# Patient Record
Sex: Female | Born: 1970 | Race: White | Hispanic: No | Marital: Married | Smoking: Never smoker
Health system: Southern US, Community
[De-identification: ages and names within clinical notes are randomized; demographics above are authoritative.]

## PROBLEM LIST (undated history)

## (undated) DIAGNOSIS — T7840XA Allergy, unspecified, initial encounter: Secondary | ICD-10-CM

## (undated) DIAGNOSIS — F429 Obsessive-compulsive disorder, unspecified: Secondary | ICD-10-CM

## (undated) DIAGNOSIS — E559 Vitamin D deficiency, unspecified: Secondary | ICD-10-CM

## (undated) DIAGNOSIS — M1990 Unspecified osteoarthritis, unspecified site: Secondary | ICD-10-CM

## (undated) DIAGNOSIS — F418 Other specified anxiety disorders: Secondary | ICD-10-CM

## (undated) HISTORY — DX: Vitamin D deficiency, unspecified: E55.9

## (undated) HISTORY — DX: Unspecified osteoarthritis, unspecified site: M19.90

## (undated) HISTORY — PX: TOE SURGERY: SHX1073

## (undated) HISTORY — PX: COLPOSCOPY: SHX161

## (undated) HISTORY — DX: Allergy, unspecified, initial encounter: T78.40XA

## (undated) HISTORY — DX: Other specified anxiety disorders: F41.8

## (undated) HISTORY — DX: Obsessive-compulsive disorder, unspecified: F42.9

---

## 2013-07-28 DIAGNOSIS — N870 Mild cervical dysplasia: Secondary | ICD-10-CM | POA: Insufficient documentation

## 2013-07-28 DIAGNOSIS — A6000 Herpesviral infection of urogenital system, unspecified: Secondary | ICD-10-CM | POA: Insufficient documentation

## 2013-07-28 DIAGNOSIS — Z803 Family history of malignant neoplasm of breast: Secondary | ICD-10-CM | POA: Insufficient documentation

## 2016-10-08 HISTORY — PX: CERVICAL BIOPSY: SHX590

## 2017-09-09 DIAGNOSIS — M19079 Primary osteoarthritis, unspecified ankle and foot: Secondary | ICD-10-CM | POA: Insufficient documentation

## 2018-01-06 ENCOUNTER — Encounter (HOSPITAL_COMMUNITY): Payer: Self-pay | Admitting: Psychiatry

## 2018-01-06 ENCOUNTER — Ambulatory Visit (INDEPENDENT_AMBULATORY_CARE_PROVIDER_SITE_OTHER): Payer: 59 | Admitting: Psychiatry

## 2018-01-06 VITALS — BP 119/80 | HR 92 | Ht 61.0 in | Wt 92.4 lb

## 2018-01-06 DIAGNOSIS — F429 Obsessive-compulsive disorder, unspecified: Secondary | ICD-10-CM | POA: Diagnosis not present

## 2018-01-06 MED ORDER — LAMOTRIGINE 25 MG PO TABS: ORAL_TABLET | ORAL | 0 refills | Status: DC

## 2018-01-06 NOTE — Progress Notes (Signed)
Psychiatric Initial Adult Assessment   Patient Identification: Anne Hayden MRN:  [id number] Date of Evaluation:  01/06/2018 Referral Source: Self-referred. Chief Complaint:   Visit Diagnosis:    ICD-10-CM   1. Obsessive-compulsive disorder, unspecified type F42.9 lamoTRIgine (LAMICTAL) 25 MG tablet    History of Present Illness: Anne Hayden is 47 year old Caucasian, unemployed, married female who came in for her initial appointment.  She moved from [place] with her mother to live close to her family.  Patient has aunt and other family member who lives in this area.  Patient moved in with her mother when her father died in 62.  She was seeing psychiatrist in [place] and prescribed Celexa, Klonopin and occasionally taking Xanax when she had panic attack.  She described long history of anxiety and nervousness and feeling overwhelmed.  She also had panic attacks however since taking the medication these attacks are less intense and less frequent.  She remember having difficulty leaving her house, going to the grocery stores, going to the carotid places because she felt very nervous, anxious, hyperventilation and shortness of breath.  She is been taking Celexa and Klonopin for past few years and she feels it is helping her anxiety symptoms but she continues to feel sometimes sadness, hopelessness, passive and fleeting suicidal thoughts but no plan.  She also describe feeling lonely and a lot of regret about her own life.  Due to anxiety she has unable to succeed the goals in her life.  All her friends are either lawyer or doctors.  She also married twice and lived overseas in the past.  Her first husband was [place] however her marriage did not last more than 2 years.  She remarried with [place] and she has been living with her husband for 7 years.  Her husband is working in [place] and she visit him at least once a month.  Patient told her husband is doing a Community education officer job and his plan is to  come back [place] after 1 year.  Patient does not want to leave her mother.  She has 2 other sister who lives in [place] and [place].  Patient admitted that she does not leave the house unless it is important.  She feels nervous and anxious.  She has obsessive thoughts which she described counting numbers, thinking about colors, thinking about different countries.  She is sleeping better with Klonopin.  There are times when she has taken extra Klonopin when she is very nervous.  She also reported sensitivity with the previous psychotropic medication.  She denies any mania or any psychosis.  She denies any self abusive behavior.  She denies any flashbacks, nightmares or any PTSD symptoms.  Associated Signs/Symptoms: Depression Symptoms:  depressed mood, feelings of worthlessness/guilt, difficulty concentrating, anxiety, (Hypo) Manic Symptoms:  Irritable Mood, Anxiety Symptoms:  Excessive Worry, Panic Symptoms, Obsessive Compulsive Symptoms:   Counting, Observation about colors, counting, checking, Social Anxiety, Psychotic Symptoms:  No psychotic symptoms. PTSD Symptoms: Negative  Past Psychiatric History: Patient had history of anxiety and depression most of her life.  She was admitted in [facility] in 2010 due to severe depression and having suicidal thoughts.  Patient denies any history of suicidal attempt.  She has seen psychiatrist on and off and prescribed Seroquel that caused weight gain, Risperdal caused nausea, Trintellix cause itching, Geodon, Neurontin and Paxil did not help.  She also tried Lamictal when she was in [place] but could not afford when she returned to [place].  Patient had history  of obsessive thoughts but denies any paranoia or psychosis.  She also recall history of one manic symptoms when she felt increased energy, extreme irritability, hyper and too much excitement.  Previous Psychotropic Medications: Yes   Substance Abuse History in the last 12 months:   No.  Consequences of Substance Abuse: Negative  Past Medical History:  Past Medical History:  Diagnosis Date  . Osteoarthritis    History reviewed. No pertinent surgical history.  Family Psychiatric History: Kateri McUncle has bipolar disorder.  Family History: No family history on file.  Social History:   Social History   Socioeconomic History  . Marital status: Married    Spouse name: Not on file  . Number of children: Not on file  . Years of education: Not on file  . Highest education level: Not on file  Occupational History  . Not on file  Social Needs  . Financial resource strain: Not on file  . Food insecurity:    Worry: Not on file    Inability: Not on file  . Transportation needs:    Medical: Not on file    Non-medical: Not on file  Tobacco Use  . Smoking status: Not on file  Substance and Sexual Activity  . Alcohol use: Not on file  . Drug use: Not on file  . Sexual activity: Not on file  Lifestyle  . Physical activity:    Days per week: Not on file    Minutes per session: Not on file  . Stress: Not on file  Relationships  . Social connections:    Talks on phone: Not on file    Gets together: Not on file    Attends religious service: Not on file    Active member of club or organization: Not on file    Attends meetings of clubs or organizations: Not on file    Relationship status: Not on file  Other Topics Concern  . Not on file  Social History Narrative  . Not on file    Additional Social History[place]  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  She started to have anxiety and nervousness when she was in college.  She married twice.  She has no children.  She lived in [place] few years with her husband however when marriage fall apart she moved to [place].  She remarried with [place] and she was living in [place] until her father deceased and she decided to move back [place].  Patient is currently unemployed.  Allergies:  No Known  Allergies  Metabolic Disorder Labs: No results found for: HGBA1C, MPG No results found for: PROLACTIN No results found for: CHOL, TRIG, HDL, CHOLHDL, VLDL, LDLCALC   Current Medications: Current Outpatient Medications  Medication Sig Dispense Refill  . norethindrone-ethinyl estradiol (BALZIVA) 0.4-35 MG-MCG tablet Take by mouth.    . valACYclovir (VALTREX) 500 MG tablet 1 po BID prn    . ALPRAZolam (XANAX) 0.25 MG tablet alprazolam 0.25 mg tablet    . citalopram (CELEXA) 40 MG tablet citalopram 40 mg tablet    . clindamycin (CLEOCIN T) 1 % lotion clindamycin 1 % lotion    . clonazePAM (KLONOPIN) 0.5 MG tablet clonazepam 0.5 mg tablet    . ketoconazole (NIZORAL) 2 % shampoo ketoconazole 2 % shampoo    . Sod Fluoride-Potassium Nitrate (PREVIDENT 5000 SENSITIVE) 1.1-5 % PSTE PreviDent 5000 Sensitive 1.1 %-5 % dental paste    . sodium fluoride (PREVIDENT) 1.1 % GEL dental gel  PreviDent 5000 Dry Mouth 1.1 % gel  BRUSH TEETH X2 MINUTES TWICE A DAY & SPIT. DONT RINSE, EAT, OR DRINK FOR 30 MINUTES    . tretinoin (RETIN-A) 0.1 % cream tretinoin 0.1 % topical cream  APPLY A PEA SIZE AMOUNT TO FULL FACE AT BEDTIME     No current facility-administered medications for this visit.     Neurologic: Headache: No Seizure: No Paresthesias:No  Musculoskeletal: Strength & Muscle Tone: within normal limits Gait & Station: normal Patient leans: N/A  Psychiatric Specialty Exam: ROS  Blood pressure 119/80, pulse 92, height 5\' 1"  (1.549 m), weight 92 lb 6.4 oz (41.9 kg).Body mass index is 17.46 kg/m.  General Appearance: Casual and Thin and lean  Eye Contact:  Fair  Speech:  Slow  Volume:  Decreased  Mood:  Anxious  Affect:  Constricted  Thought Process:  Goal Directed  Orientation:  Full (Time, Place, and Person)  Thought Content:  Obsessions and Rumination  Suicidal Thoughts:  Passive and fleeting suicidal thoughts but no plan or any intent.  She has these thoughts most of her life.   Homicidal Thoughts:  No  Memory:  Immediate;   Good Recent;   Good Remote;   Good  Judgement:  Good  Insight:  Good  Psychomotor Activity:  Decreased  Concentration:  Concentration: Fair and Attention Span: Fair  Recall:  Good  Fund of Knowledge:Good  Language: Good  Akathisia:  No  Handed:  Right  AIMS (if indicated):  0  Assets:  Communication Skills Desire for Improvement Housing Resilience Social Support  ADL's:  Intact  Cognition: WNL  Sleep: Good    Treatment Plan Summary: Anne Hayden is 47 year old Caucasian, unemployed married female who came for her initial appointment.  She present with a history of anxiety and OCD symptoms.  She also have depression.  She is taking Celexa Klonopin and Xanax but she still have residual symptoms.  I recommended to try Lamictal which she took when she was in [place] but she could not afford when she moved back to [place].  I explained the Lamictal may help her mood symptoms and chronic suicidal thinking.  I also encouraged to see a therapist for CBT.  We discussed medication side effects in detail.  Explained that Lamictal can cause rash and in that case she need to stop the medication immediately.  Patient has a refill remaining on her Klonopin and Celexa.  She is taking Klonopin 0.5 mg twice a day and third as needed.  Celexa 40 mg daily.  Recommended to discontinue Xanax since patient is already taking Klonopin.  We discussed safety concerns at any time having active suicidal thoughts or homicidal thought and she need to call 911 or go to local emergency room.  Patient is actively looking for a primary care physician.  I recommended to call us if she have difficulty finding a PCP.  I will see her again in 4 weeks.   Cleotis Nipper, MD 6/27/201911:08 AM

## 2018-01-17 ENCOUNTER — Ambulatory Visit (HOSPITAL_COMMUNITY): Payer: 59 | Admitting: Licensed Clinical Social Worker

## 2018-01-26 ENCOUNTER — Encounter: Payer: Self-pay | Admitting: Family Medicine

## 2018-01-26 ENCOUNTER — Ambulatory Visit (INDEPENDENT_AMBULATORY_CARE_PROVIDER_SITE_OTHER): Payer: 59 | Admitting: Family Medicine

## 2018-01-26 VITALS — BP 103/67 | HR 85 | Temp 97.6°F | Resp 18 | Ht 61.0 in | Wt 92.4 lb

## 2018-01-26 DIAGNOSIS — R87618 Other abnormal cytological findings on specimens from cervix uteri: Secondary | ICD-10-CM

## 2018-01-26 DIAGNOSIS — N870 Mild cervical dysplasia: Secondary | ICD-10-CM

## 2018-01-26 DIAGNOSIS — Z7689 Persons encountering health services in other specified circumstances: Secondary | ICD-10-CM | POA: Diagnosis not present

## 2018-01-26 DIAGNOSIS — R8789 Other abnormal findings in specimens from female genital organs: Secondary | ICD-10-CM

## 2018-01-26 DIAGNOSIS — Z130 Encounter for screening for diseases of the blood and blood-forming organs and certain disorders involving the immune mechanism: Secondary | ICD-10-CM | POA: Diagnosis not present

## 2018-01-26 DIAGNOSIS — E559 Vitamin D deficiency, unspecified: Secondary | ICD-10-CM

## 2018-01-26 DIAGNOSIS — F418 Other specified anxiety disorders: Secondary | ICD-10-CM

## 2018-01-26 DIAGNOSIS — Z79899 Other long term (current) drug therapy: Secondary | ICD-10-CM

## 2018-01-26 DIAGNOSIS — F429 Obsessive-compulsive disorder, unspecified: Secondary | ICD-10-CM

## 2018-01-26 DIAGNOSIS — A6000 Herpesviral infection of urogenital system, unspecified: Secondary | ICD-10-CM

## 2018-01-26 LAB — COMPREHENSIVE METABOLIC PANEL
ALBUMIN: 3.9 g/dL (ref 3.5–5.2)
ALT: 10 U/L (ref 0–35)
AST: 14 U/L (ref 0–37)
Alkaline Phosphatase: 54 U/L (ref 39–117)
BUN: 7 mg/dL (ref 6–23)
CHLORIDE: 104 meq/L (ref 96–112)
CO2: 30 mEq/L (ref 19–32)
Calcium: 8.9 mg/dL (ref 8.4–10.5)
Creatinine, Ser: 0.74 mg/dL (ref 0.40–1.20)
GFR: 89.9 mL/min (ref >60.00)
GLUCOSE: 71 mg/dL (ref 70–99)
Potassium: 4 mEq/L (ref 3.5–5.1)
SODIUM: 139 meq/L (ref 135–145)
Total Bilirubin: 0.3 mg/dL (ref 0.2–1.2)
Total Protein: 6.5 g/dL (ref 6.0–8.3)

## 2018-01-26 LAB — CBC
HEMATOCRIT: 40 % (ref 36.0–46.0)
Hemoglobin: 13.4 g/dL (ref 12.0–15.0)
MCHC: 33.4 g/dL (ref 30.0–36.0)
MCV: 89.7 fl (ref 78.0–100.0)
Platelets: 345 10*3/uL (ref 150.0–400.0)
RBC: 4.45 Mil/uL (ref 3.87–5.11)
RDW: 13.3 % (ref 11.5–15.5)
WBC: 4.5 10*3/uL (ref 4.0–10.5)

## 2018-01-26 NOTE — Progress Notes (Signed)
Patient ID: Anne Hayden, female  DOB: 1971/02/14, 47 y.o.   MRN: [id number] Patient Care Team    Relationship Specialty Notifications Start End  Ma Hillock, DO PCP - General Family Medicine  01/26/18   Kathlee Nations, MD Consulting Physician Psychiatry  01/27/18     Chief Complaint  Patient presents with  . Establish Care    Subjective:  Anne Hayden is a 47 y.o.  female present for new patient establishment. All past medical history, surgical history, allergies, family history, immunizations, medications and social history were updated in the electronic medical record today. All recent labs, ED visits and hospitalizations within the last year were reviewed. Last CPE completed at prior PCP around 05/2017. Records requested.  Genital herpes simplex, unspecified site Uses valtrex PRN  Depression with anxiety/Obsessive-compulsive disorder, unspecified type Established with psychiatry.   Vitamin D deficiency On daily supplement, awaiting records. 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 Colposcopy thousand and 4, 2005, 2012, and 2018 Last PAP: Pap with HR HPV (09/21/2017 10:00 AM EDT) Pap with HR HPV (09/21/2017 10:00 AM EDT)  Specimen  TPIHPVHR (Lab use only)   Pap with HR HPV (09/21/2017 10:00 AM EDT)  Narrative Performed At        [facility]  [address], [place] [address]  WFUXN:[phone]  [phone]    CYTOLOGY GYNECOLOGICAL REPORT    Patient Name: Anne Hayden, KALP. Accession #: [id number] Med. Rec. #: [id number]  Client: [facility] Collected: 09/21/2017 DOB: Sep 09, 1970 (Age:  54) Location: L1 Received: 09/22/2017 Gender:F Billing #: [id number] Reported:  09/23/2017 Pathologist:SSN: [id number] Physician(s):SUSAN LORENCZ, NP  KRISTEN B CHALK   ThinPrep, Imaging HPV-HR, Cervical /  Endocervical      Final Cytologic Diagnosis  FINAL CYTOLOGIC INTERPRETATION   Negative for intraepithelial lesion or malignancy.  ADDITIONAL DIAGNOSTIC FINDINGS   Moderate inflammation present.  STATEMENT OF ADEQUACY   Satisfactory for evaluation. Endocervical/transformation zone component  present.       These results have been sent to the patient by mail.     **Electronically Signed ByRenda Rolls, CT (ASCP)   The Pap smear is a screening test which aids in the detection of premalignant  and malignant diseases of the uterine cervix.It is not a diagnostic procedure  and should not be used as the sole method of cervical cancer detection.Both  false positive and false negative results can occur.            Previous Pap History  Spec # Date Taken Interpretation   [id number]/2016 ASCUS  [id number]/07/2015 Negative for Intraep  [id number]/16/2016 Negative for Intraep      Procedures/Addenda    Interpretation  HPV, High Risk:POSITIVE        HPV High Risk testing performed using the Hologic APTIMA HPV mRNA Assay.The  High Risk HPV types include the following: 16, 18, 31, 33, 35, 39, 45, 51, 52,  56, 58, 59, 66, 68.Note:Other HPV types that may cause anogenital lesions  may be present but not detected by this assay.The significance of the other  types of HPV in malignant processes has not been established.             Technical processing and screening performed at [facility], [place],  [place] [address].CLIA # [id number].Medical Director, Shaune Spittle, MD  [place]  [facility] [address] [place], [place] [address] Phone: [phone] Fax: [phone]  SURGICAL PATHOLOGY REPORT  Patient Name: Anne Hayden, Anne Hayden. Accession #: [id number] Med. Rec. #: [id number] Client: [facility]. Collected 10/07/2016 DOB: 12/26/1970 (Age: 5) Loc.: L1 Received: 10/07/2016 Gender:F Billing #: [id number] Reported: 10/08/2016 Pathologist: Sarajane Marek A. Tomasita Morrow, M.D. SSN: [id number] Physician(s):  Halina Andreas, NP   Clinical History ASCUS with positive high-risk HPV cervical  Specimen(s) Received A: Biopsy Cervical B: Endocervix Curettage   Final Pathologic Diagnosis A. CERVIX, BIOPSY:  1.BENIGN ENDOCERVICAL EPITHELIUM WITH NO EVIDENCE OF DYSPLASIA OR MALIGNANCY. 2.SEE COMMENT A.  B. ENDOCERVIX, CURETTAGE:  1.CIN-I (MILD SQUAMOUS DYSPLASIA AND KOILOCYTOTIC CHANGES, LOW GRADE SQUAMOUS INTRAEPITHELIAL LESION). 2.NO EVIDENCE OF MALIGNANCY. 3.MODERATE CHRONIC CERVICITIS. 4.TRANSFORMATION ZONE PRESENT. 5.SEE COMMENT B.  Depression screen PHQ 2/9 01/26/2018  Decreased Interest 0  Down, Depressed, Hopeless 2  PHQ - 2 Score 2  Altered sleeping 1  Tired, decreased energy 1  Change in appetite 0  Feeling bad or failure about yourself  2  Trouble concentrating 1  Moving slowly or fidgety/restless 0  Suicidal thoughts 0  PHQ-9 Score 7   No flowsheet data found.  Current Exercise Habits: Home exercise routine, Type of exercise: walking, Time (Minutes): 30, Frequency (Times/Week): 6, Weekly Exercise (Minutes/Week): 180, Intensity: Mild   Fall Risk  01/26/2018  Falls in the past year? No   Immunization History  Administered Date(s) Administered  . Influenza,inj,quad, With Preservative 05/13/2017    No exam data present  Past Medical History:  Diagnosis Date  . Allergy   . Depression with anxiety    est. with psychiatry, Dr. Adele Schilder. In the past she reports she has been told she was bipolar and OCD  . OCD (obsessive compulsive disorder)   . Osteoarthritis   . Vitamin D deficiency    No Known Allergies Past Surgical History:  Procedure  Laterality Date  . 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 COLPOSCOPY  2004,2005,2012,2018   Family History  Problem Relation Age of Onset  . Arthritis Mother   . Hypertension Mother   . Heart disease Father   . Stroke Father   . Polycythemia Father   . Breast cancer Sister 42  . Breast cancer Maternal Grandmother 24  . Melanoma Maternal Grandfather   . COPD Maternal Grandfather   . Diabetes Paternal Grandmother   . Heart disease Paternal Grandfather    Social History   Socioeconomic History  . Marital status: Married    Spouse name: Not on file  . Number of children: Not on file  . Years of education: Not on file  . Highest education level: Master's degree (e.g., MA, MS, MEng, MEd, MSW, MBA)  Occupational History  . Not on file  Social Needs  . Financial resource strain: Not hard at all  . Food insecurity:    Worry: Never true    Inability: Never true  . Transportation needs:    Medical: No    Non-medical: No  Tobacco Use  . Smoking status: Never Smoker  . Smokeless tobacco: Never Used  Substance and Sexual Activity  . Alcohol use: Yes    Frequency: Never    Comment: rare  . Drug use: Never  . Sexual activity: Yes    Partners: Male  Lifestyle  . Physical activity:    Days per week: 7 days    Minutes per session: 10 min  .  Stress: Very much  Relationships  . Social connections:    Talks on phone: More than three times a week    Gets together: Once a week    Attends religious service: Never    Active member of club or organization: No    Attends meetings of clubs or organizations: Never    Relationship status: Married  . Intimate partner violence:    Fear of current or ex partner: No    Emotionally abused: No    Physically abused: No    Forced sexual activity: No  Other Topics Concern  . Not on file  Social History Narrative   Married. Husband lives in [place] through the week. She recently moved to [place] to be closer to her family.    Masters of  Arts degree. Unemployed.    Allergies as of 01/26/2018   No Known Allergies     Medication List        Accurate as of 01/26/18 11:59 PM. Always use your most recent med list.          BALZIVA 0.4-35 MG-MCG tablet Generic drug:  norethindrone-ethinyl estradiol Take by mouth.   cetirizine 10 MG tablet Commonly known as:  ZYRTEC Take 10 mg by mouth daily.   citalopram 40 MG tablet Commonly known as:  CELEXA citalopram 40 mg tablet   clindamycin 1 % lotion Commonly known as:  CLEOCIN T clindamycin 1 % lotion   clonazePAM 0.5 MG tablet Commonly known as:  KLONOPIN clonazepam 0.5 mg tablet   ketoconazole 2 % shampoo Commonly known as:  NIZORAL ketoconazole 2 % shampoo   lamoTRIgine 25 MG tablet Commonly known as:  LAMICTAL Take one tab daily for one week and than 2 tab daily   PREVIDENT 1.1 % Gel dental gel Generic drug:  sodium fluoride PreviDent 5000 Dry Mouth 1.1 % gel  BRUSH TEETH X2 MINUTES TWICE A DAY & SPIT. DONT RINSE, EAT, OR DRINK FOR 30 MINUTES   PREVIDENT 5000 SENSITIVE 1.1-5 % Pste Generic drug:  Sod Fluoride-Potassium Nitrate PreviDent 5000 Sensitive 1.1 %-5 % dental paste   tretinoin 0.1 % cream Commonly known as:  RETIN-A tretinoin 0.1 % topical cream  APPLY A PEA SIZE AMOUNT TO FULL FACE AT BEDTIME   valACYclovir 500 MG tablet Commonly known as:  VALTREX 1 po BID prn       All past medical history, surgical history, allergies, family history, immunizations andmedications were updated in the EMR today and reviewed under the history and medication portions of their EMR.    Recent Results (from the past 2160 hour(s))  Comp Met (CMET)     Status: None   Collection Time: 01/26/18 11:40 AM  Result Value Ref Range   Sodium 139 135 - 145 mEq/L   Potassium 4.0 3.5 - 5.1 mEq/L   Chloride 104 96 - 112 mEq/L   CO2 30 19 - 32 mEq/L   Glucose, Bld 71 70 - 99 mg/dL   BUN 7 6 - 23 mg/dL   Creatinine, Ser 0.74 0.40 - 1.20 mg/dL   Total Bilirubin 0.3  0.2 - 1.2 mg/dL   Alkaline Phosphatase 54 39 - 117 U/L   AST 14 0 - 37 U/L   ALT 10 0 - 35 U/L   Total Protein 6.5 6.0 - 8.3 g/dL   Albumin 3.9 3.5 - 5.2 g/dL   Calcium 8.9 8.4 - 10.5 mg/dL   GFR 89.90 >60.00 mL/min  CBC     Status: None  Collection Time: 01/26/18 11:40 AM  Result Value Ref Range   WBC 4.5 4.0 - 10.5 K/uL   RBC 4.45 3.87 - 5.11 Mil/uL   Platelets 345.0 150.0 - 400.0 K/uL   Hemoglobin 13.4 12.0 - 15.0 g/dL   HCT 40.0 36.0 - 46.0 %   MCV 89.7 78.0 - 100.0 fl   MCHC 33.4 30.0 - 36.0 g/dL   RDW 13.3 11.5 - 15.5 %    Patient was never admitted.   ROS: 14 pt review of systems performed and negative (unless mentioned in an HPI)  Objective: BP 103/67 (BP Location: Right Arm, Patient Position: Sitting, Cuff Size: Small)   Pulse 85   Temp 97.6 F (36.4 C)   Resp 18   Ht _0  (1.549 m)   Wt 92 lb 6.4 oz (41.9 kg)   LMP 01/24/2018   SpO2 98%   BMI 17.46 kg/m  Gen: Afebrile. No acute distress. Nontoxic in appearance, well-developed, well-nourished,  Pleasant, thin, caucasian female.  HENT: AT. [place].  MMM  Eyes:Pupils Equal Round Reactive to light, Extraocular movements intact,  Conjunctiva without redness, discharge or icterus. Neck/lymp/endocrine: Supple,no lymphadenopathy, no thyromegaly CV: RRR no murmur, no edema, +2/4 P posterior tibialis pulses.  Chest: CTAB, no wheeze, rhonchi or crackles.  Abd: Soft. NTND. BS present. Skin: no rashes, purpura or petechiae. Warm and well-perfused. Skin intact. Neuro/Msk:  Normal gait. PERLA. EOMi. Alert. Oriented x3.   Psych: Normal affect, dress and demeanor. Normal speech. Normal thought content and judgment.  Assessment/plan: Anne Hayden is a 47 y.o. female present for establish care.  Genital herpes simplex, unspecified site On valtrex PRN, does not need refills at this time.   Depression with anxiety/Obsessive-compulsive disorder, unspecified type Established with psychiatry, Dr. Adele Schilder, which supplies all  her psychiatric medications.   Vitamin D deficiency Reports over the counter supplement   CIN I (cervical intraepithelial neoplasia I)/HPV HR-positive History of CIN I 10/07/2016. PAP 09/2017--> per GYN recs f/u 6 mos from last pap--> Due Sept 29, 2019.  - Gyn referral placed today for her to establish locally.   Return in about 5 months (around 06/28/2018) for CPE.   Note is dictated utilizing voice recognition software. Although note has been proof read prior to signing, occasional typographical errors still can be missed. If any questions arise, please do not hesitate to call for verification.  Electronically signed by: Howard Pouch, DO [place]

## 2018-01-26 NOTE — Patient Instructions (Signed)
It was a pleasure meeting you today.  I will place a referral to Gynecology, they will call you to schedule.  We will call you with labs once results available.   Please help us help you:  We are honored [facility] your [facility]. Below you will find basic instructions that you may need to access in the future. Please help us help you by reading the instructions, which cover many of the frequent questions we experience.   Prescription refills and request:  -In order to allow more efficient response time, please call your pharmacy for all refills. They will forward the request electronically to us. This allows for the quickest possible response. Request left on a nurse line can take longer to refill, since these are checked as time allows between office patients and other phone calls.  - refill request can take up to 3-5 working days to complete.  - If request is sent electronically and request is appropiate, it is usually completed in 1-2 business days.  - all patients will need to be seen routinely for all chronic medical conditions requiring prescription medications (see follow-up below). If you are overdue for follow up on your condition, you will be asked to make an appointment and we will call in enough medication to cover you until your appointment (up to 30 days).  - all controlled substances will require a face to face visit to request/refill.  - if you desire your prescriptions to go through a new pharmacy, and have an active script at original pharmacy, you will need to call your pharmacy and have scripts transferred to new pharmacy. This is completed between the pharmacy locations and not by your provider.    Results: If any images or labs were ordered, it can take up to 1 week to get results depending on the test ordered and the lab/facility running and resulting the test. - Normal or stable results, which do not need further discussion, may be released  to your mychart immediately with attached note to you. A call may not be generated for normal results. Please make certain to sign up for mychart. If you have questions on how to activate your mychart you can call the front office.  - If your results need further discussion, our office will attempt to contact you via phone, and if unable to reach you after 2 attempts, we will release your abnormal result to your mychart with instructions.  - All results will be automatically released in mychart after 1 week.  - Your provider will provide you with explanation and instruction on all relevant material in your results. Please keep in mind, results and labs may appear confusing or abnormal to the untrained eye, but it does not mean they are actually abnormal for you personally. If you have any questions about your results that are not covered, or you desire more detailed explanation than what was provided, you should make an appointment with your provider to do so.   Our office handles many outgoing and incoming calls daily. If we have not contacted you within 1 week about your results, please check your mychart to see if there is a message first and if not, then contact our office.  In helping with this matter, you help decrease call volume, and therefore allow us to be able to respond to patients needs more efficiently.   Acute office visits (sick visit):  An acute visit is intended for a new  problem and are scheduled in shorter time slots to allow schedule openings for patients with new problems. This is the appropriate visit to discuss a new problem. Problems will not be addressed by phone call or Echart message. Appointment is needed if requesting treatment. In order to provide you with excellent quality medical care with proper time for you to explain your problem, have an exam and receive treatment with instructions, these appointments should be limited to one new problem per visit. If you experience a new  problem, in which you desire to be addressed, please make an acute office visit, we save openings on the schedule to accommodate you. Please do not save your new problem for any other type of visit, let us take care of it properly and quickly for you.   Follow up visits:  Depending on your condition(s) your provider will need to see you routinely in order to provide you with quality care and prescribe medication(s). Most chronic conditions (Example: hypertension, Diabetes, depression/anxiety... etc), require visits a couple times a year. Your provider will instruct you on proper follow up for your personal medical conditions and history. Please make certain to make follow up appointments for your condition as instructed. Failing to do so could result in lapse in your medication treatment/refills. If you request a refill, and are overdue to be seen on a condition, we will always provide you with a 30 day script (once) to allow you time to schedule.    Medicare wellness (well visit): - we have a wonderful Nurse Maudie Mercury), that will meet with you and provide you will yearly medicare wellness visits. These visits should occur yearly (can not be scheduled less than 1 calendar year apart) and cover preventive health, immunizations, advance directives and screenings you are entitled to yearly through your medicare benefits. Do not miss out on your entitled benefits, this is when medicare will pay for these benefits to be ordered for you.  These are strongly encouraged by your provider and is the appropriate type of visit to make certain you are up to date with all preventive health benefits. If you have not had your medicare wellness exam in the last 12 months, please make certain to schedule one by calling the office and schedule your medicare wellness with Maudie Mercury as soon as possible.   Yearly physical (well visit):  - Adults are recommended to be seen yearly for physicals. Check with your insurance and date of your  last physical, most insurances require one calendar year between physicals. Physicals include all preventive health topics, screenings, medical exam and labs that are appropriate for gender/age and history. You may have fasting labs needed at this visit. This is a well visit (not a sick visit), new problems should not be covered during this visit (see acute visit).  - Pediatric patients are seen more frequently when they are younger. Your provider will advise you on well child visit timing that is appropriate for your their age. - This is not a medicare wellness visit. Medicare wellness exams do not have an exam portion to the visit. Some medicare companies allow for a physical, some do not allow a yearly physical. If your medicare allows a yearly physical you can schedule the medicare wellness with our nurse Maudie Mercury and have your physical with your provider after, on the same day. Please check with insurance for your full benefits.   Late Policy/No Shows:  - all new patients should arrive 15-30 minutes earlier than appointment to allow [place]  time  to  obtain all personal demographics,  insurance information and for you to complete office paperwork. - All established patients should arrive 10-15 minutes earlier than appointment time to update all information and be checked in .  - In our best efforts to run on time, if you are late for your appointment you will be asked to either reschedule or if able, we will work you back into the schedule. There will be a wait time to work you back in the schedule,  depending on availability.  - If you are unable to make it to your appointment as scheduled, please call 24 hours ahead of time to allow [place] to fill the time slot with someone else who needs to be seen. If you do not cancel your appointment ahead of time, you may be charged a no show fee.

## 2018-01-27 ENCOUNTER — Encounter: Payer: Self-pay | Admitting: Family Medicine

## 2018-01-27 ENCOUNTER — Telehealth: Payer: Self-pay | Admitting: Family Medicine

## 2018-01-27 DIAGNOSIS — F429 Obsessive-compulsive disorder, unspecified: Secondary | ICD-10-CM | POA: Insufficient documentation

## 2018-01-27 DIAGNOSIS — F418 Other specified anxiety disorders: Secondary | ICD-10-CM | POA: Insufficient documentation

## 2018-01-27 DIAGNOSIS — E559 Vitamin D deficiency, unspecified: Secondary | ICD-10-CM | POA: Insufficient documentation

## 2018-01-27 NOTE — Telephone Encounter (Signed)
She is also in the work you for call back on her labs today. Please inform patient I have placed her gynecology referral.  I was able to review her last gynecological note in more detail and it had requested she follow-up in 6 months, which means she would be due at the end of September/October for her repeat Pap, not in March of next year as she originally thought.  I have placed in the referral be scheduled around October with a female gynecological provider.

## 2018-01-27 NOTE — Telephone Encounter (Signed)
Detailed message left on voice mail. Okay per DPR. 

## 2018-01-28 ENCOUNTER — Other Ambulatory Visit (HOSPITAL_COMMUNITY): Payer: Self-pay | Admitting: Psychiatry

## 2018-01-28 DIAGNOSIS — F429 Obsessive-compulsive disorder, unspecified: Secondary | ICD-10-CM

## 2018-02-01 ENCOUNTER — Other Ambulatory Visit (HOSPITAL_COMMUNITY): Payer: Self-pay

## 2018-02-01 DIAGNOSIS — F429 Obsessive-compulsive disorder, unspecified: Secondary | ICD-10-CM

## 2018-02-01 MED ORDER — LAMOTRIGINE 25 MG PO TABS: ORAL_TABLET | ORAL | 0 refills | Status: DC

## 2018-02-11 ENCOUNTER — Ambulatory Visit (HOSPITAL_COMMUNITY): Payer: 59 | Admitting: Psychiatry

## 2018-02-28 ENCOUNTER — Other Ambulatory Visit (HOSPITAL_COMMUNITY): Payer: Self-pay | Admitting: Psychiatry

## 2018-02-28 DIAGNOSIS — F429 Obsessive-compulsive disorder, unspecified: Secondary | ICD-10-CM

## 2018-03-04 ENCOUNTER — Other Ambulatory Visit (HOSPITAL_COMMUNITY): Payer: Self-pay

## 2018-03-04 DIAGNOSIS — F429 Obsessive-compulsive disorder, unspecified: Secondary | ICD-10-CM

## 2018-03-04 MED ORDER — LAMOTRIGINE 25 MG PO TABS: ORAL_TABLET | ORAL | 0 refills | Status: DC

## 2018-03-23 ENCOUNTER — Ambulatory Visit (INDEPENDENT_AMBULATORY_CARE_PROVIDER_SITE_OTHER): Payer: 59 | Admitting: Psychiatry

## 2018-03-23 ENCOUNTER — Encounter (HOSPITAL_COMMUNITY): Payer: Self-pay | Admitting: Psychiatry

## 2018-03-23 VITALS — BP 102/68 | Ht 61.0 in | Wt 93.6 lb

## 2018-03-23 DIAGNOSIS — F429 Obsessive-compulsive disorder, unspecified: Secondary | ICD-10-CM | POA: Diagnosis not present

## 2018-03-23 DIAGNOSIS — F411 Generalized anxiety disorder: Secondary | ICD-10-CM | POA: Diagnosis not present

## 2018-03-23 DIAGNOSIS — Z79899 Other long term (current) drug therapy: Secondary | ICD-10-CM

## 2018-03-23 DIAGNOSIS — R45851 Suicidal ideations: Secondary | ICD-10-CM

## 2018-03-23 DIAGNOSIS — F331 Major depressive disorder, recurrent, moderate: Secondary | ICD-10-CM

## 2018-03-23 DIAGNOSIS — Z560 Unemployment, unspecified: Secondary | ICD-10-CM

## 2018-03-23 MED ORDER — CITALOPRAM HYDROBROMIDE 40 MG PO TABS: 40.0000 mg | ORAL_TABLET | Freq: Every day | ORAL | 0 refills | Status: DC

## 2018-03-23 MED ORDER — CLONAZEPAM 0.5 MG PO TABS: ORAL_TABLET | ORAL | 2 refills | Status: DC

## 2018-03-23 MED ORDER — LAMOTRIGINE 100 MG PO TABS: ORAL_TABLET | ORAL | 0 refills | Status: DC

## 2018-03-23 NOTE — Patient Instructions (Signed)
   Increase Lamictal to 75 mg daily for one week.  Then Lamictal 100 mg daily until your next appointment .  Continue Celexa 40 mg daily.  Continue Klonopin 0.5 mg twice a day and may take 1 daily as needed for severe anxiety, no more than 15 times a month.   Schedule consultation with ECT provider.

## 2018-03-23 NOTE — Progress Notes (Signed)
BH MD/PA/NP OP Progress Note  03/23/2018 10:33 AM [name]  MRN:  [id number]  Chief Complaint:  I feel very depressed.  I would rather sleep all the time, and just not think.   Chief Complaint    Follow-up; Anxiety; Depression     HPI: Anne Hayden is a 47 y.o. Caucasian, unemployed, married female who presents for follow-up of depression, anxiety and OCD.  At her initial visit she was taking Celexa Klonopin and Xanax but she still have residual symptoms.  Recommendations to start Lamictal since she had done well on previously, continue Celexa and Klonopin, and discontinue Xanax.  She has not noticed a difference in her mood or suicidal thoughts since starting Lamictal.  She is currently on 50 mg since 01/2018.  She reports, "I feel very depressed. I would rather sleep all the time, and just not think. Lately, I can't concentrate to read. I am only staying alive for family and my husband."   She states that she has chronic suicidal thoughts, but "is afraid to do anything."  She reports that she would never jump or use a gun.  She thinks her mother may still have a gun of her father's but she does not have access to it or would know how to use it.  She has thought about overdose and going to sleep in a tub, but has not made any plans and specifically denies any plans for suicide.  She states that she is looking forward to her husband visiting soon, and her trip to see him in [place].  She describes other obsessions as counting things,  thinking about colors, thinking about different countries, making lists, making codes, and ruminations about what she should have done with her life, and it upset that she has mental issues.  She states that she was #2 in her class and "never did anything".  She was encouraged to see a therapist for CBT, however she has not.  She reports that she has been in therapy in the past for 5 years- CBT, talk therapy and hypnosis, and has not found them to be helpful. She is  sleeping better with Klonopin.  There are times when she has taken extra Klonopin when she is very nervous. She still has some Xanax which she will use if she has to take a long flight.  She also reported sensitivity with the previous psychotropic medication.  She denies any mania or any psychosis.  She denies any self abusive behavior.  She denies any flashbacks, nightmares or any PTSD symptoms. She denies any specific phobias, or panic attacks. She denies any crying spells or any feeling of hopelessness or worthlessness. She denies mania or hypomania with racing thoughts, pressured speech, hyperactivity, grandiosity, distractibility, or changes in sleep or appetite. She denies SI, HI, self harm thoughts or AVH. She is tolerating medication without any side effects.  She has no tremors, shakes or any EPS.  Patient denies using alcohol or any illegal substances. Her energy level is fair.  Her appetite is good.  Vital signs are stable.  No new medication added, will make adjustments in Lamictal with close follow-up.  Discussed in depth therapy techniques and further encouraged therapy.  Discussed medication alternatives in detail.  Discussed with patient IOP, which she would consider after visits with her husband.  Patient would also like to have a consult for ECT, as this has been suggested to her by a past psychiatrist, however she moved.  Visit Diagnosis:    ICD-10-CM   1. MDD (major depressive disorder), recurrent episode, moderate (HCC) F33.1   2. Generalized anxiety disorder F41.1   3. Obsessive-compulsive disorder, unspecified type F42.9 lamoTRIgine (LAMICTAL) 100 MG tablet  4. Suicidal thoughts R45.851   5. Encounter for medication management [phone]     Past Psychiatric History: Patient had history of anxiety and depression most of her life.  She described long history of anxiety and nervousness and feeling overwhelmed.  She was admitted in [facility] in 2010 due to severe  depression and having suicidal thoughts.  Patient denies any history of suicidal attempt.  She has seen psychiatrist on and off and prescribed Seroquel that caused weight gain, Risperdal caused nausea, Trintellix cause itching, Geodon, Neurontin and Paxil did not help.  She also tried Lamictal when she was in [place] but could not afford when she returned to [place].  Patient had history of obsessive thoughts but denies any paranoia or psychosis.  She also recall history of one manic symptoms when she felt increased energy, extreme irritability, hyper and too much excitement for 2 weeks.  She has no hx of self harm, no suicide attempts.   Past Medical History:  Past Medical History:  Diagnosis Date  . Allergy   . Depression with anxiety    est. with psychiatry, Dr. [name]. In the past she reports she has been told she was bipolar and OCD  . OCD (obsessive compulsive disorder)   . Osteoarthritis   . Vitamin D deficiency     Past Surgical History:  Procedure Laterality Date  . 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 COLPOSCOPY  2004,2005,2012,2018    Family Psychiatric History: Reviewed  Family History:  Family History  Problem Relation Age of Onset  . Arthritis Mother   . Hypertension Mother   . Heart disease Father   . Stroke Father   . Polycythemia Father   . Breast cancer Sister 63  . Breast cancer Maternal Grandmother 1  . Melanoma Maternal Grandfather   . COPD Maternal Grandfather   . Diabetes Paternal Grandmother   . Heart disease Paternal Grandfather     Social History:  She married twice and lived overseas in the past.   Her first husband was [place] however her marriage did not last more than 2 years.  She remarried with [place] and she has been living with her husband for 7 years.  Her husband is working in [place] and she visit him at least once a month.  Patient told her husband is doing a Community education officer job and his plan is to come back [place] after  1 year.    Patient moved in with her mother when her father died in 89.  She has lived with mother off and on, while husband works away. Living with mom in [place].  She has 2 big dogs She moved from [place] with her mother to live close to her family.  Patient has aunt and other family member who lives in this area.  Patient does not want to leave her mother.   Husband lives in [place], and she sees him 1/month, and works at the [employer]. Prior lived in [place] 2015, when father was dying.  She has 2 other sister who lives in [place] and [place]. She is the middle child of 3 sisters.  Older sister is 61 years older and younger sister is 5 years younger.  She  has limited communication with her younger sister who "gets annoyed with her habits".   Social History   Socioeconomic History  . Marital status: Married    Spouse name: Not on file  . Number of children: Not on file  . Years of education: Not on file  . Highest education level: Master's degree (e.g., MA, MS, MEng, MEd, MSW, MBA)  Occupational History  . Not on file  Social Needs  . Financial resource strain: Not hard at all  . Food insecurity:    Worry: Never true    Inability: Never true  . Transportation needs:    Medical: No    Non-medical: No  Tobacco Use  . Smoking status: Never Smoker  . Smokeless tobacco: Never Used  Substance and Sexual Activity  . Alcohol use: Yes    Frequency: Never    Comment: rare  . Drug use: Never  . Sexual activity: Yes    Partners: Male  Lifestyle  . Physical activity:    Days per week: 7 days    Minutes per session: 10 min  . Stress: Very much  Relationships  . Social connections:    Talks on phone: More than three times a week    Gets together: Once a week    Attends religious service: Never    Active member of club or organization: No    Attends meetings of clubs or organizations: Never    Relationship status: Married  Other Topics Concern  . Not on file   Social History Narrative   Married. Husband lives in [place] through the week. She recently moved to [place] to be closer to her family.    Masters of Arts degree. Unemployed.     Allergies: No Known Allergies  Metabolic Disorder Labs: No results found for: HGBA1C, MPG No results found for: PROLACTIN No results found for: CHOL, TRIG, HDL, CHOLHDL, VLDL, LDLCALC No results found for: TSH  Therapeutic Level Labs: No results found for: LITHIUM No results found for: VALPROATE No components found for:  CBMZ  Current Medications: Current Outpatient Medications  Medication Sig Dispense Refill  . cetirizine (ZYRTEC) 10 MG tablet Take 10 mg by mouth daily.    . citalopram (CELEXA) 40 MG tablet citalopram 40 mg tablet    . clindamycin (CLEOCIN T) 1 % lotion clindamycin 1 % lotion    . clonazePAM (KLONOPIN) 0.5 MG tablet clonazepam 0.5 mg tablet    . ketoconazole (NIZORAL) 2 % shampoo ketoconazole 2 % shampoo    . lamoTRIgine (LAMICTAL) 25 MG tablet Take 2 tab daily 60 tablet 0  . norethindrone-ethinyl estradiol (BALZIVA) 0.4-35 MG-MCG tablet Take by mouth.    . Sod Fluoride-Potassium Nitrate (PREVIDENT 5000 SENSITIVE) 1.1-5 % PSTE PreviDent 5000 Sensitive 1.1 %-5 % dental paste    . sodium fluoride (PREVIDENT) 1.1 % GEL dental gel PreviDent 5000 Dry Mouth 1.1 % gel  BRUSH TEETH X2 MINUTES TWICE A DAY & SPIT. DONT RINSE, EAT, OR DRINK FOR 30 MINUTES    . tretinoin (RETIN-A) 0.1 % cream tretinoin 0.1 % topical cream  APPLY A PEA SIZE AMOUNT TO FULL FACE AT BEDTIME    . valACYclovir (VALTREX) 500 MG tablet 1 po BID prn     No current facility-administered medications for this visit.      Musculoskeletal: Strength & Muscle Tone: within normal limits Gait & Station: normal Patient leans: N/A  Psychiatric Specialty Exam: Review of Systems  Constitutional: Negative.   Respiratory: Negative.   Cardiovascular: Negative.  Gastrointestinal: Negative.   Musculoskeletal: Negative.    Neurological: Negative.   Psychiatric/Behavioral: Positive for depression and suicidal ideas. Negative for hallucinations, memory loss and substance abuse. The patient is nervous/anxious. The patient does not have insomnia.     Blood pressure 102/68, height 5\' 1"  (1.549 m), weight 93 lb 9.6 oz (42.5 kg).Body mass index is 17.69 kg/m.  General Appearance: Casual and Neat  Eye Contact:  Good  Speech:  Clear and Coherent and Normal Rate  Volume:  Normal  Mood:  Anxious and Dysphoric  Affect:  Congruent  Thought Process:  Coherent, Linear and Descriptions of Associations: Tangential  Orientation:  Full (Time, Place, and Person)  Thought Content: Logical and Hallucinations: None   Suicidal Thoughts:  Yes.  without intent/plan  Homicidal Thoughts:  No  Memory:  Immediate;   Good Recent;   Good Remote;   Good  Judgement:  Fair  Insight:  Fair  Psychomotor Activity:  Normal  Concentration:  Concentration: Good and Attention Span: Good  Recall:  Good  Fund of Knowledge: Good  Language: Good  Akathisia:  No  Handed:  Right  AIMS (if indicated): not done  Assets:  Architect Housing Intimacy Leisure Time Physical Health Resilience Social Support Transportation  ADL's:  Intact  Cognition: WNL  Sleep:  Fair   Screenings: PHQ2-9     Office Visit from 01/26/2018 in [facility]  PHQ-2 Total Score  2  PHQ-9 Total Score  7       Assessment and Plan:  MACELYN ZULLI is a 47 y.o. Caucasian, unemployed married female who came for follow-up appointment.  She presents with a history of anxiety and OCD symptoms.  She also have depression.  She is taking Celexa Klonopin and Lamictal with little improvement.  She has chronic suicidal thoughts, without intent or plan, and no hx of self-harm or suicide. Protective factors include upcoming travel plans and visits with her husband, and living with her mother and dogs.  I have  strongly encouraged her to restart therapy and offered IOP, which she will consider. She is also interested in exploring ECT as an option.  We discussed TMS, but she is not interested in that at this time.  Patient Instructions    Increase Lamictal to 75 mg daily for one week.  Then Lamictal 100 mg daily until your next appointment .  Continue Celexa 40 mg daily.  Continue Klonopin 0.5 mg twice a day and may take 1 daily as needed for severe anxiety, no more than 15 times a month.   Schedule consultation with ECT provider.     Explained that Lamictal can cause rash and in that case she need to stop the medication immediately.  Time spent 45 minutes.  More than 50% of the time spent in medication education, psychoeducation, counseling and coordination of care.  Discuss safety plan that anytime having active suicidal thoughts or homicidal thoughts then patient need to call 911 or go to the local emergency room.    Mariel Craft, MD 03/23/2018, 10:33 AM

## 2018-03-30 ENCOUNTER — Other Ambulatory Visit (HOSPITAL_COMMUNITY): Payer: Self-pay | Admitting: Orthopedic Surgery

## 2018-03-31 ENCOUNTER — Other Ambulatory Visit (HOSPITAL_COMMUNITY): Payer: Self-pay | Admitting: Psychiatry

## 2018-03-31 DIAGNOSIS — F429 Obsessive-compulsive disorder, unspecified: Secondary | ICD-10-CM

## 2018-04-21 ENCOUNTER — Encounter (HOSPITAL_BASED_OUTPATIENT_CLINIC_OR_DEPARTMENT_OTHER): Payer: Self-pay | Admitting: *Deleted

## 2018-04-21 ENCOUNTER — Other Ambulatory Visit: Payer: Self-pay

## 2018-04-25 ENCOUNTER — Other Ambulatory Visit: Payer: Self-pay

## 2018-04-25 ENCOUNTER — Ambulatory Visit (INDEPENDENT_AMBULATORY_CARE_PROVIDER_SITE_OTHER): Payer: 59 | Admitting: Psychiatry

## 2018-04-25 ENCOUNTER — Encounter: Payer: Self-pay | Admitting: Psychiatry

## 2018-04-25 VITALS — BP 121/79 | HR 99 | Temp 97.8°F | Wt 92.4 lb

## 2018-04-25 DIAGNOSIS — F331 Major depressive disorder, recurrent, moderate: Secondary | ICD-10-CM

## 2018-04-25 DIAGNOSIS — F411 Generalized anxiety disorder: Secondary | ICD-10-CM

## 2018-04-25 DIAGNOSIS — F429 Obsessive-compulsive disorder, unspecified: Secondary | ICD-10-CM | POA: Diagnosis not present

## 2018-04-25 NOTE — Progress Notes (Signed)
ECT: This is an ECT consult for this 47 year old woman with long-standing mood disorder who is referred by her outpatient psychiatrist.  Patient on time and appropriate for interview.  Patient reports that current mood is down and negative essentially all of the time.  Feels excessively tired although sleep at night is erratic.  Feels anxious and overly worried.  Constantly has ruminative thoughts about suicide but has no plan or intention to kill herself because in part she has obsessive thoughts constantly about going to hell.  Patient is seeing a psychiatrist locally.  She actually was assigned to one psychiatrist but that person is apparently out sick and so she is seeing an alternate provider temporarily.  She is currently on citalopram lamotrigine clonazepam as primary psychiatric medicines.  Patient denies alcohol or drug abuse.  She denies auditory or visual hallucinations.  She does report large amounts of the day taken up by intrusive worries thoughts and sometimes compulsive behaviors.  No homicidal ideation.  Patient has considered ECT in the past but it was recently recommended to her by her current psychiatrist because of persistent symptoms.  Social history: She is married but her husband evidently is currently living in [place] temporarily for work.  The patient lives with her mother.  No children at home.  Patient does not work outside the home.  Medical history: She is scheduled to have a minor operation on a bone in her foot at the end of this week.  She does not have any cardiac or pulmonary disease.  Substance abuse history: Denies regular alcohol use says she drinks alcohol no more than a couple times a year never abusively denies any drug use.  Past psychiatric history: Patient reports she has had anxiety and depression symptoms since she was an adolescent.  As an adult they will come and go but have been persistently present for at least a year currently.  She has been seeing a  psychiatrist for years.  She has a history of 1 previous hospitalization years ago but none since then.  Multiple medications have been tried including multiple serotonin reuptake inhibitor antidepressants as well as mood stabilizers and antipsychotics.  Current medicine is about as effective as anything is been.  Denies any treated history of suicide attempts although she says there was a time many years ago when she briefly considered strangling herself but did not follow through on it.  Denies ever having auditory or visual hallucinations.  Patient denies any major obvious trauma as a cause for her illness.  Mental status exam: Neatly dressed and groomed woman looks her stated age.  Cooperative with interview.  Eye contact diminished.  Looks fidgety and anxious much of the time although answers to questions appear honest and forthcoming.  Speaks in a very quiet tone of voice and at times seems to mutter to herself although there is no sign of any psychosis to it I think.  Denies hallucinations.  No evidence of paranoia or delusions.  Endorses chronic suicidal thoughts but says she would never act on it in part because of a obsessive fear of going to hell.  Patient's affect is dysphoric and anxious mood is stated as depressed.  Assessment: This is a 47 year old woman with major depression and what appears to be significant obsessive-compulsive disorder.  Partially treated but still with severely disabling symptoms despite therapy and medication management.  Patient has minimal complicating medical problems.  Overall she would be a reasonable candidate for electroconvulsive therapy.  Patient was  educated about ECT extensively with open opportunity to ask questions and engage in dialogue about treatment.  I advised her that while the treatment was not 100% effective I thought it would be worth trying given her continued severe symptoms her apparent diagnosis with major depression and the low risks given her  medical condition.  Patient states she is agreeable to the plan.  Because of the surgery on her foot later this week she is not sure whether she will be able to start by the middle of next week but we will tentatively try for that.  Patient was given the order form for the preadmission lab testing.  Note will be emailed to the ECT team and nursing.  We will anticipate likely treatment with right  unilateral treatment starting next week

## 2018-04-26 ENCOUNTER — Encounter (HOSPITAL_COMMUNITY): Payer: Self-pay | Admitting: Psychiatry

## 2018-04-26 ENCOUNTER — Ambulatory Visit (INDEPENDENT_AMBULATORY_CARE_PROVIDER_SITE_OTHER): Payer: 59 | Admitting: Psychiatry

## 2018-04-26 VITALS — BP 104/64 | HR 78 | Ht 61.0 in | Wt 92.4 lb

## 2018-04-26 DIAGNOSIS — F429 Obsessive-compulsive disorder, unspecified: Secondary | ICD-10-CM | POA: Diagnosis not present

## 2018-04-26 DIAGNOSIS — F331 Major depressive disorder, recurrent, moderate: Secondary | ICD-10-CM

## 2018-04-26 DIAGNOSIS — F411 Generalized anxiety disorder: Secondary | ICD-10-CM | POA: Diagnosis not present

## 2018-04-26 MED ORDER — LAMOTRIGINE 150 MG PO TABS: 150.0000 mg | ORAL_TABLET | Freq: Every day | ORAL | 0 refills | Status: DC

## 2018-04-26 MED ORDER — CLONAZEPAM 0.5 MG PO TABS: ORAL_TABLET | ORAL | 1 refills | Status: DC

## 2018-04-26 NOTE — Progress Notes (Signed)
BH MD/PA/NP OP Progress Note  04/26/2018 10:51 AM Anne Hayden  MRN:  [id number]  Chief Complaint: Still depressed and anxious.  Have no motivation to do things.  HPI: Anne Hayden came for her follow-up appointment.  She was last seen by covering psychiatrist while I was on leave.  Her Lamictal increased to 100 mg.  She is tolerating Lamictal and she has no rash or itching but she continued to feel sad depressed and anxious.  She has obsession about wearing shoes, jewelry, closed and sometimes she obsess about tea.  Patient told her obsession changes and sometimes she has racing thoughts at night.  Though she denies any major panic attack but she feel anxious.  She continues to have passive and fleeting suicidal thoughts and believe 1 days she will be vanish but she has no active plan or any intent.  Recently she seen Dr. Toni Amend for ECT evaluation.  Now she is agreed to have a ECT treatment.  Patient went to [place] to visit her husband but she mentioned her trip was so-so.  Most of the time she was staying by herself.  She is not sure about therapy because she wants to give a try to ECT.  Patient denies any agitation, anger, violence or any nightmares.  Her energy level is fair.  She is hoping to start ECT next week.  Visit Diagnosis:    ICD-10-CM   1. MDD (major depressive disorder), recurrent episode, moderate (HCC) F33.1   2. Generalized anxiety disorder F41.1   3. Obsessive-compulsive disorder, unspecified type F42.9     Past Psychiatric History: Reviewed Patient had history of anxiety and depression most of her life.  She was admitted in [facility] in 2010 due to severe depression and having suicidal thoughts.  Patient denies any history of harm or suicidal attempt.  She has seen psychiatrist on and off and prescribed Seroquel that caused weight gain, Risperdal caused nausea, Trintellix cause itching, Geodon, Neurontin and Paxil did not help.  She also tried Lamictal when she was  in [place] but could not afford when she returned to [place].  Patient had history of obsessive thoughts but denies any paranoia or psychosis.  She also recall history of one manic symptoms when she felt increased energy, extreme irritability, hyper and too much excitement for 2 weeks  Past Medical History:  Past Medical History:  Diagnosis Date  . Allergy   . Depression with anxiety    est. with psychiatry, Dr. [name]. In the past she reports she has been told she was bipolar and OCD  . OCD (obsessive compulsive disorder)   . Osteoarthritis   . Vitamin D deficiency     Past Surgical History:  Procedure Laterality Date  . 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 COLPOSCOPY  2004,2005,2012,2018    Family Psychiatric History: Viewed  Family History:  Family History  Problem Relation Age of Onset  . Arthritis Mother   . Hypertension Mother   . Heart disease Father   . Stroke Father   . Polycythemia Father   . Breast cancer Sister 29  . Breast cancer Maternal Grandmother 27  . Melanoma Maternal Grandfather   . COPD Maternal Grandfather   . Diabetes Paternal Grandmother   . Heart disease Paternal Grandfather   . Bipolar disorder Paternal Uncle     Social History:  Social History   Socioeconomic History  . Marital status: Married    Spouse name: markus  .  Number of children: 0  . Years of education: Not on file  . Highest education level: Master's degree (e.g., MA, MS, MEng, MEd, MSW, MBA)  Occupational History  . Not on file  Social Needs  . Financial resource strain: Not hard at all  . Food insecurity:    Worry: Never true    Inability: Never true  . Transportation needs:    Medical: No    Non-medical: No  Tobacco Use  . Smoking status: Never Smoker  . Smokeless tobacco: Never Used  Substance and Sexual Activity  . Alcohol use: Not Currently    Frequency: Never    Comment: rare  . Drug use: Never  . Sexual activity: Yes    Partners: Male  Lifestyle  .  Physical activity:    Days per week: 7 days    Minutes per session: 10 min  . Stress: Very much  Relationships  . Social connections:    Talks on phone: More than three times a week    Gets together: Once a week    Attends religious service: Never    Active member of club or organization: No    Attends meetings of clubs or organizations: Never    Relationship status: Married  Other Topics Concern  . Not on file  Social History Narrative   Married. Husband lives in [place] through the week. She recently moved to [place] to be closer to her family.    Masters of Arts degree. Unemployed.     Allergies: No Known Allergies  Metabolic Disorder Labs: No results found for: HGBA1C, MPG No results found for: PROLACTIN No results found for: CHOL, TRIG, HDL, CHOLHDL, VLDL, LDLCALC No results found for: TSH  Therapeutic Level Labs: No results found for: LITHIUM No results found for: VALPROATE No components found for:  CBMZ  Current Medications: Current Outpatient Medications  Medication Sig Dispense Refill  . citalopram (CELEXA) 40 MG tablet Take 1 tablet (40 mg total) by mouth daily. 90 tablet 0  . clindamycin (CLEOCIN T) 1 % lotion clindamycin 1 % lotion    . clonazePAM (KLONOPIN) 0.5 MG tablet Use twice daily and may use 1 daily PRN (not more than 15 x a month) 75 tablet 2  . ketoconazole (NIZORAL) 2 % shampoo ketoconazole 2 % shampoo    . lamoTRIgine (LAMICTAL) 100 MG tablet Take 2 tab daily 90 tablet 0  . norethindrone-ethinyl estradiol (BALZIVA) 0.4-35 MG-MCG tablet Take by mouth.    . Sod Fluoride-Potassium Nitrate (PREVIDENT 5000 SENSITIVE) 1.1-5 % PSTE PreviDent 5000 Sensitive 1.1 %-5 % dental paste    . sodium fluoride (PREVIDENT) 1.1 % GEL dental gel PreviDent 5000 Dry Mouth 1.1 % gel  BRUSH TEETH X2 MINUTES TWICE A DAY & SPIT. DONT RINSE, EAT, OR DRINK FOR 30 MINUTES    . tretinoin (RETIN-A) 0.1 % cream tretinoin 0.1 % topical cream  APPLY A PEA SIZE AMOUNT TO FULL FACE AT BEDTIME     . valACYclovir (VALTREX) 500 MG tablet 1 po BID prn     No current facility-administered medications for this visit.      Musculoskeletal: Strength & Muscle Tone: within normal limits Gait & Station: normal Patient leans: N/A  Psychiatric Specialty Exam: ROS  Blood pressure 104/64, pulse 78, height 5\' 1"  (1.549 m), weight 92 lb 6.4 oz (41.9 kg), last menstrual period 04/20/2018.There is no height or weight on file to calculate BMI.  General Appearance: Casual and Neat  Eye Contact:  Good  Speech:  Clear and Coherent  Volume:  Decreased  Mood:  Anxious and Dysphoric  Affect:  Flat  Thought Process:  Descriptions of Associations: Intact  Orientation:  Full (Time, Place, and Person)  Thought Content: Obsessions and Rumination   Suicidal Thoughts:  chronic and passive fleeting thoughts but no plan  Homicidal Thoughts:  No  Memory:  Immediate;   Good Recent;   Good Remote;   Good  Judgement:  Fair  Insight:  Fair  Psychomotor Activity:  Decreased  Concentration:  Concentration: Fair and Attention Span: Fair  Recall:  Fair  Fund of Knowledge: Good  Language: Good  Akathisia:  No  Handed:  Right  AIMS (if indicated): not done  Assets:  Communication Skills Desire for Improvement Housing  ADL's:  Intact  Cognition: WNL  Sleep:  Fair   Screenings: PHQ2-9     Office Visit from 01/26/2018 in [facility] Total Score  2  PHQ-9 Total Score  7       Assessment and Plan: Major depressive disorder, recurrent.  Generalized anxiety disorder.  Obsessive-compulsive disorder.  Panic attacks.  Patient continues to have symptoms of anxiety and depression.  She agreed to have a ECT treatment and hoping to start next week.  She has evaluation by Dr. Toni Amend.  I recommended to try Lamictal 150 mg since it is not causing any side effects and may help her residual depression.  She will consider starting therapy once she finished the ECT.  She is also having  upcoming foot surgery next week.  I recommended to call us back if he has any question or any concern.  Discussed safety concerns at any time having active suicidal thoughts or homicidal thought and she need to call 911 or go to local emergency room.  I will see her again in 4 to 6 weeks once she had ECT treatment.   Cleotis Nipper, MD 04/26/2018, 10:51 AM

## 2018-04-28 ENCOUNTER — Encounter (HOSPITAL_BASED_OUTPATIENT_CLINIC_OR_DEPARTMENT_OTHER): Payer: Self-pay | Admitting: *Deleted

## 2018-04-28 ENCOUNTER — Other Ambulatory Visit: Payer: Self-pay

## 2018-04-28 ENCOUNTER — Ambulatory Visit (HOSPITAL_BASED_OUTPATIENT_CLINIC_OR_DEPARTMENT_OTHER): Payer: 59 | Admitting: Anesthesiology

## 2018-04-28 ENCOUNTER — Ambulatory Visit (HOSPITAL_BASED_OUTPATIENT_CLINIC_OR_DEPARTMENT_OTHER)
Admission: RE | Admit: 2018-04-28 | Discharge: 2018-04-28 | Disposition: A | Discharge status: Home / Self Care | Payer: 59 | Source: Ambulatory Visit | Attending: Orthopedic Surgery | Admitting: Orthopedic Surgery

## 2018-04-28 ENCOUNTER — Encounter (HOSPITAL_BASED_OUTPATIENT_CLINIC_OR_DEPARTMENT_OTHER)
Admission: RE | Disposition: A | Discharge status: Home / Self Care | Payer: Self-pay | Source: Ambulatory Visit | Attending: Orthopedic Surgery

## 2018-04-28 DIAGNOSIS — Z79899 Other long term (current) drug therapy: Secondary | ICD-10-CM | POA: Insufficient documentation

## 2018-04-28 DIAGNOSIS — Z818 Family history of other mental and behavioral disorders: Secondary | ICD-10-CM | POA: Diagnosis not present

## 2018-04-28 DIAGNOSIS — M2021 Hallux rigidus, right foot: Secondary | ICD-10-CM | POA: Insufficient documentation

## 2018-04-28 DIAGNOSIS — Z803 Family history of malignant neoplasm of breast: Secondary | ICD-10-CM | POA: Insufficient documentation

## 2018-04-28 DIAGNOSIS — Z823 Family history of stroke: Secondary | ICD-10-CM | POA: Diagnosis not present

## 2018-04-28 DIAGNOSIS — Z833 Family history of diabetes mellitus: Secondary | ICD-10-CM | POA: Insufficient documentation

## 2018-04-28 DIAGNOSIS — M19071 Primary osteoarthritis, right ankle and foot: Secondary | ICD-10-CM

## 2018-04-28 DIAGNOSIS — Z832 Family history of diseases of the blood and blood-forming organs and certain disorders involving the immune mechanism: Secondary | ICD-10-CM | POA: Diagnosis not present

## 2018-04-28 DIAGNOSIS — F329 Major depressive disorder, single episode, unspecified: Secondary | ICD-10-CM | POA: Diagnosis not present

## 2018-04-28 DIAGNOSIS — Z8261 Family history of arthritis: Secondary | ICD-10-CM | POA: Insufficient documentation

## 2018-04-28 DIAGNOSIS — E559 Vitamin D deficiency, unspecified: Secondary | ICD-10-CM | POA: Diagnosis not present

## 2018-04-28 DIAGNOSIS — F429 Obsessive-compulsive disorder, unspecified: Secondary | ICD-10-CM | POA: Insufficient documentation

## 2018-04-28 DIAGNOSIS — Z8249 Family history of ischemic heart disease and other diseases of the circulatory system: Secondary | ICD-10-CM | POA: Diagnosis not present

## 2018-04-28 DIAGNOSIS — M1990 Unspecified osteoarthritis, unspecified site: Secondary | ICD-10-CM | POA: Diagnosis not present

## 2018-04-28 DIAGNOSIS — Z808 Family history of malignant neoplasm of other organs or systems: Secondary | ICD-10-CM | POA: Diagnosis not present

## 2018-04-28 DIAGNOSIS — F419 Anxiety disorder, unspecified: Secondary | ICD-10-CM | POA: Insufficient documentation

## 2018-04-28 LAB — POCT PREGNANCY, URINE: PREG TEST UR: NEGATIVE

## 2018-04-28 SURGERY — CHEILECTOMY, GREAT TOE, WITH IMPLANT INSERTION
Anesthesia: General | Site: Foot | Laterality: Right

## 2018-04-28 MED ORDER — OXYCODONE HCL 5 MG/5ML PO SOLN: 5.0000 mg | Freq: Once | ORAL | Status: DC | PRN

## 2018-04-28 MED ORDER — ONDANSETRON HCL 4 MG/2ML IJ SOLN
INTRAMUSCULAR | Status: DC | PRN
  Administered 2018-04-28: 4 mg via INTRAVENOUS

## 2018-04-28 MED ORDER — SCOPOLAMINE 1 MG/3DAYS TD PT72: 1.0000 | MEDICATED_PATCH | Freq: Once | TRANSDERMAL | Status: DC | PRN

## 2018-04-28 MED ORDER — MIDAZOLAM HCL 2 MG/2ML IJ SOLN
INTRAMUSCULAR | Status: AC
  Filled 2018-04-28: qty 2

## 2018-04-28 MED ORDER — FENTANYL CITRATE (PF) 100 MCG/2ML IJ SOLN
INTRAMUSCULAR | Status: AC
  Filled 2018-04-28: qty 2

## 2018-04-28 MED ORDER — OXYCODONE HCL 5 MG PO TABS: 5.0000 mg | ORAL_TABLET | Freq: Once | ORAL | Status: DC | PRN

## 2018-04-28 MED ORDER — FENTANYL CITRATE (PF) 100 MCG/2ML IJ SOLN: 25.0000 ug | INTRAMUSCULAR | Status: DC | PRN

## 2018-04-28 MED ORDER — FENTANYL CITRATE (PF) 100 MCG/2ML IJ SOLN
50.0000 ug | INTRAMUSCULAR | Status: DC | PRN
  Administered 2018-04-28 (×2): 50 ug via INTRAVENOUS

## 2018-04-28 MED ORDER — OXYCODONE HCL 5 MG PO TABS: 5.0000 mg | ORAL_TABLET | Freq: Four times a day (QID) | ORAL | 0 refills | Status: AC | PRN

## 2018-04-28 MED ORDER — SODIUM CHLORIDE 0.9 % IV SOLN: INTRAVENOUS | Status: DC

## 2018-04-28 MED ORDER — SENNA 8.6 MG PO TABS: 2.0000 | ORAL_TABLET | Freq: Two times a day (BID) | ORAL | 0 refills | Status: DC

## 2018-04-28 MED ORDER — LACTATED RINGERS IV SOLN
INTRAVENOUS | Status: DC
  Administered 2018-04-28: 09:00:00 via INTRAVENOUS

## 2018-04-28 MED ORDER — ROPIVACAINE HCL 7.5 MG/ML IJ SOLN
INTRAMUSCULAR | Status: DC | PRN
  Administered 2018-04-28: 25 mL via PERINEURAL

## 2018-04-28 MED ORDER — ACETAMINOPHEN 325 MG PO TABS: 325.0000 mg | ORAL_TABLET | ORAL | Status: DC | PRN

## 2018-04-28 MED ORDER — ONDANSETRON HCL 4 MG/2ML IJ SOLN: 4.0000 mg | Freq: Once | INTRAMUSCULAR | Status: DC | PRN

## 2018-04-28 MED ORDER — CHLORHEXIDINE GLUCONATE 4 % EX LIQD: 60.0000 mL | Freq: Once | CUTANEOUS | Status: DC

## 2018-04-28 MED ORDER — DEXAMETHASONE SODIUM PHOSPHATE 10 MG/ML IJ SOLN
INTRAMUSCULAR | Status: DC | PRN
  Administered 2018-04-28: 10 mg via INTRAVENOUS

## 2018-04-28 MED ORDER — CEFAZOLIN SODIUM-DEXTROSE 2-4 GM/100ML-% IV SOLN
2.0000 g | INTRAVENOUS | Status: AC
  Administered 2018-04-28: 2 g via INTRAVENOUS

## 2018-04-28 MED ORDER — ACETAMINOPHEN 160 MG/5ML PO SOLN: 325.0000 mg | ORAL | Status: DC | PRN

## 2018-04-28 MED ORDER — 0.9 % SODIUM CHLORIDE (POUR BTL) OPTIME
TOPICAL | Status: DC | PRN
  Administered 2018-04-28: 1000 mL

## 2018-04-28 MED ORDER — MEPERIDINE HCL 25 MG/ML IJ SOLN: 6.2500 mg | INTRAMUSCULAR | Status: DC | PRN

## 2018-04-28 MED ORDER — PROPOFOL 10 MG/ML IV BOLUS
INTRAVENOUS | Status: DC | PRN
  Administered 2018-04-28: 150 mg via INTRAVENOUS

## 2018-04-28 MED ORDER — MIDAZOLAM HCL 2 MG/2ML IJ SOLN
1.0000 mg | INTRAMUSCULAR | Status: DC | PRN
  Administered 2018-04-28 (×2): 1 mg via INTRAVENOUS

## 2018-04-28 MED ORDER — PROPOFOL 500 MG/50ML IV EMUL
INTRAVENOUS | Status: DC | PRN
  Administered 2018-04-28: 25 ug/kg/min via INTRAVENOUS

## 2018-04-28 MED ORDER — LIDOCAINE HCL (CARDIAC) PF 100 MG/5ML IV SOSY
PREFILLED_SYRINGE | INTRAVENOUS | Status: DC | PRN
  Administered 2018-04-28: 30 mg via INTRAVENOUS

## 2018-04-28 MED ORDER — CEFAZOLIN SODIUM-DEXTROSE 2-4 GM/100ML-% IV SOLN
INTRAVENOUS | Status: AC
  Filled 2018-04-28: qty 100

## 2018-04-28 MED ORDER — DOCUSATE SODIUM 100 MG PO CAPS: 100.0000 mg | ORAL_CAPSULE | Freq: Two times a day (BID) | ORAL | 0 refills | Status: DC

## 2018-04-28 SURGICAL SUPPLY — 57 items
BANDAGE ESMARK 6X9 LF (GAUZE/BANDAGES/DRESSINGS) IMPLANT
BLADE SURG 15 STRL LF DISP TIS (BLADE) ×2 IMPLANT
BLADE SURG 15 STRL SS (BLADE) ×2
BNDG COHESIVE 4X5 TAN STRL (GAUZE/BANDAGES/DRESSINGS) ×2 IMPLANT
BNDG CONFORM 2 STRL LF (GAUZE/BANDAGES/DRESSINGS) IMPLANT
BNDG CONFORM 3 STRL LF (GAUZE/BANDAGES/DRESSINGS) ×2 IMPLANT
BNDG ESMARK 4X9 LF (GAUZE/BANDAGES/DRESSINGS) IMPLANT
BNDG ESMARK 6X9 LF (GAUZE/BANDAGES/DRESSINGS)
CHLORAPREP W/TINT 26ML (MISCELLANEOUS) ×2 IMPLANT
COVER BACK TABLE 60X90IN (DRAPES) ×2 IMPLANT
COVER WAND RF STERILE (DRAPES) IMPLANT
CUFF TOURNIQUET SINGLE 34IN LL (TOURNIQUET CUFF) IMPLANT
DRAPE EXTREMITY T 121X128X90 (DRAPE) ×2 IMPLANT
DRAPE OEC MINIVIEW 54X84 (DRAPES) IMPLANT
DRAPE SURG 17X23 STRL (DRAPES) IMPLANT
DRAPE U-SHAPE 47X51 STRL (DRAPES) ×2 IMPLANT
DRSG MEPITEL 4X7.2 (GAUZE/BANDAGES/DRESSINGS) ×2 IMPLANT
DRSG PAD ABDOMINAL 8X10 ST (GAUZE/BANDAGES/DRESSINGS) ×2 IMPLANT
ELECT REM PT RETURN 9FT ADLT (ELECTROSURGICAL) ×2
ELECTRODE REM PT RTRN 9FT ADLT (ELECTROSURGICAL) ×1 IMPLANT
GAUZE SPONGE 4X4 12PLY STRL (GAUZE/BANDAGES/DRESSINGS) ×2 IMPLANT
GLOVE BIO SURGEON STRL SZ8 (GLOVE) ×2 IMPLANT
GLOVE BIOGEL PI IND STRL 7.0 (GLOVE) ×2 IMPLANT
GLOVE BIOGEL PI IND STRL 8 (GLOVE) ×2 IMPLANT
GLOVE BIOGEL PI INDICATOR 7.0 (GLOVE) ×2
GLOVE BIOGEL PI INDICATOR 8 (GLOVE) ×2
GLOVE ECLIPSE 6.5 STRL STRAW (GLOVE) ×2 IMPLANT
GLOVE ECLIPSE 8.0 STRL XLNG CF (GLOVE) ×2 IMPLANT
GOWN STRL REUS W/ TWL LRG LVL3 (GOWN DISPOSABLE) ×1 IMPLANT
GOWN STRL REUS W/ TWL XL LVL3 (GOWN DISPOSABLE) ×2 IMPLANT
GOWN STRL REUS W/TWL LRG LVL3 (GOWN DISPOSABLE) ×1
GOWN STRL REUS W/TWL XL LVL3 (GOWN DISPOSABLE) ×2
IMPL MTP CARTIVA 10MM (Orthopedic Implant) ×1 IMPLANT
IMPLANT MTP CARTIVA 10MM (Orthopedic Implant) ×2 IMPLANT
NEEDLE HYPO 25X1 1.5 SAFETY (NEEDLE) IMPLANT
NS IRRIG 1000ML POUR BTL (IV SOLUTION) ×2 IMPLANT
PACK BASIN DAY SURGERY FS (CUSTOM PROCEDURE TRAY) ×2 IMPLANT
PAD CAST 4YDX4 CTTN HI CHSV (CAST SUPPLIES) ×1 IMPLANT
PADDING CAST COTTON 4X4 STRL (CAST SUPPLIES) ×1
PENCIL BUTTON HOLSTER BLD 10FT (ELECTRODE) ×2 IMPLANT
SANITIZER HAND PURELL 535ML FO (MISCELLANEOUS) ×2 IMPLANT
SHEET MEDIUM DRAPE 40X70 STRL (DRAPES) ×2 IMPLANT
SLEEVE SCD COMPRESS KNEE MED (MISCELLANEOUS) ×2 IMPLANT
SPONGE LAP 18X18 RF (DISPOSABLE) ×2 IMPLANT
STOCKINETTE 6  STRL (DRAPES) ×1
STOCKINETTE 6 STRL (DRAPES) ×1 IMPLANT
SUCTION FRAZIER HANDLE 10FR (MISCELLANEOUS) ×1
SUCTION TUBE FRAZIER 10FR DISP (MISCELLANEOUS) ×1 IMPLANT
SUT ETHILON 3 0 PS 1 (SUTURE) ×2 IMPLANT
SUT MNCRL AB 3-0 PS2 18 (SUTURE) ×2 IMPLANT
SUT VIC AB 2-0 SH 27 (SUTURE) ×1
SUT VIC AB 2-0 SH 27XBRD (SUTURE) ×1 IMPLANT
SYR BULB 3OZ (MISCELLANEOUS) ×2 IMPLANT
SYR CONTROL 10ML LL (SYRINGE) IMPLANT
TOWEL GREEN STERILE FF (TOWEL DISPOSABLE) ×2 IMPLANT
TUBE CONNECTING 20X1/4 (TUBING) ×2 IMPLANT
UNDERPAD 30X30 (UNDERPADS AND DIAPERS) ×2 IMPLANT

## 2018-04-28 NOTE — Anesthesia Procedure Notes (Signed)
Procedure Name: LMA Insertion Date/Time: 04/28/2018 9:48 AM Performed by: Sheryn Bison, CRNA Pre-anesthesia Checklist: Patient identified, Emergency Drugs available, Suction available and Patient being monitored Patient Re-evaluated:Patient Re-evaluated prior to induction Oxygen Delivery Method: Circle system utilized Preoxygenation: Pre-oxygenation with 100% oxygen Induction Type: IV induction Ventilation: Mask ventilation without difficulty LMA: LMA inserted LMA Size: 3.0 Number of attempts: 1 Airway Equipment and Method: Bite block Placement Confirmation: positive ETCO2 Tube secured with: Tape Dental Injury: Teeth and Oropharynx as per pre-operative assessment

## 2018-04-28 NOTE — Op Note (Signed)
04/28/2018  10:27 AM  PATIENT:  Anne Hayden  47 y.o. female  PRE-OPERATIVE DIAGNOSIS:  Right foot hallux rigidus  POST-OPERATIVE DIAGNOSIS:  Right foot hallux rigidus  Procedure(s): Right hallux metatarsal Phalangeal joint cheilectomy and resurfacing (10 mm Cartiva implant)  SURGEON:  Toni Arthurs, MD  ASSISTANT: Alfredo Martinez, PA-C  ANESTHESIA:   General, regional  EBL:  minimal   TOURNIQUET:   Total Tourniquet Time Documented: Thigh (Right) - 16 minutes Total: Thigh (Right) - 16 minutes  COMPLICATIONS:  None apparent  DISPOSITION:  Extubated, awake and stable to recovery.  INDICATION FOR PROCEDURE: The patient is a 47 year old female who has a long history of right forefoot pain due to hallux rigidus.  She has failed nonoperative treatment to date including activity modification, oral anti-inflammatories and shoewear modification.  She presents now for operative treatment of this painful and limiting condition.  The risks and benefits of the alternative treatment options have been discussed in detail.  The patient wishes to proceed with surgery and specifically understands risks of bleeding, infection, nerve damage, blood clots, need for additional surgery, amputation and death.  PROCEDURE IN DETAIL:  After pre operative consent was obtained, and the correct operative site was identified, the patient was brought to the operating room and placed supine on the OR table.  Anesthesia was administered.  Pre-operative antibiotics were administered.  A surgical timeout was taken.  The right lower extremity was prepped and draped in standard sterile fashion with a tourniquet around the thigh.  The extremity was elevated and the tourniquet was inflated to 250 mmHg.  A longitudinal incision was then made over the hallux MP joint.  Dissection was carried down through the subcutaneous tissues.  The extensor houses longus and brevis tendons were retracted after mobilizing them.  The dorsal  joint capsule was then incised and elevated medially and laterally.  The collateral ligaments were released.  The sesamoid articulations were mobilized with a joker elevator.  The head of the metatarsal was noted to have full-thickness cartilage loss centrally with several flaps of cartilage.  A 10 mm sizer was placed at the head and was determined to be the correct size.  A guidepin was then inserted into the head of the metatarsal just dorsal from the midline.  A 10 mm reamer was then advanced almost to the articular cartilage.  The bone was all removed and the wound was irrigated copiously.  The socket was dried with suction and the implant was inserted.  It was noted to seat appropriately.  The joint was reduced.  Dorsiflexion at the MP joint was approximately 60 degrees.  Wound was irrigated copiously.  The joint capsule was repaired with simple sutures of 2-0 Vicryl.  Subtenons tissues were approximated with 3-0 Monocryl.  The skin incision was closed with horizontal mattress sutures of 3-0 nylon.  Sterile dressings were applied followed by a compression wrap.  The tourniquet was released after application of the dressings.  The patient was awakened from anesthesia and transported to the recovery room in stable condition.   FOLLOW UP PLAN: Weightbearing as tolerated in a flat postop shoe.  Follow-up in the office in 2 weeks for suture removal and to initiate active and passive range of motion.    Alfredo Martinez PA-C was present and scrubbed for the duration of the operative case. His assistance was essential in positioning the patient, prepping and draping, gaining and maintaining exposure, performing the operation, closing and dressing the wounds and applying the splint.

## 2018-04-28 NOTE — Discharge Instructions (Addendum)
Anne Hewitt, MD °[facility] ° °Please read the following information regarding your care after surgery. ° °Medications  °You only need a prescription for the narcotic pain medicine (ex. oxycodone, Percocet, Norco).  All of the other medicines listed below are available over the counter. °X Aleve 2 pills twice a day for the first 3 days after surgery. °X acetominophen (Tylenol) 650 mg every 4-6 hours as you need for minor to moderate pain °X oxycodone as prescribed for severe pain ° °Narcotic pain medicine (ex. oxycodone, Percocet, Vicodin) will cause constipation.  To prevent this problem, take the following medicines while you are taking any pain medicine. °X docusate sodium (Colace) 100 mg twice a day X senna (Senokot) 2 tablets twice a day ° °Weight Bearing °X Bear weight only on your operated foot in the post-op shoe. ° ° °Cast / Splint / Dressing °X Keep your splint, cast or dressing clean and dry.  Don’t put anything (coat hanger, pencil, etc) down inside of it.  If it gets damp, use a hair dryer on the cool setting to dry it.  If it gets soaked, call the office to schedule an appointment for a cast change. ° ° °After your dressing, cast or splint is removed; you may shower, but do not soak or scrub the wound.  Allow the water to run over it, and then gently pat it dry. ° °Swelling °It is normal for you to have swelling where you had surgery.  To reduce swelling and pain, keep your toes above your nose for at least 3 days after surgery.  It may be necessary to keep your foot or leg elevated for several weeks.  If it hurts, it should be elevated. ° °Follow Up °Call my office at [phone] when you are discharged from the hospital or surgery center to schedule an appointment to be seen two weeks after surgery. ° °Call my office at [phone] if you develop a fever >101.5° F, nausea, vomiting, bleeding from the surgical site or severe pain.   ° ° ° ° °Post Anesthesia Home Care  Instructions ° °Activity: °Get plenty of rest for the remainder of the day. A responsible individual must stay with you for 24 hours following the procedure.  °For the next 24 hours, DO NOT: °-Drive a car °-Operate machinery °-Drink alcoholic beverages °-Take any medication unless instructed by your physician °-Make any legal decisions or sign important papers. ° °Meals: °Start with liquid foods such as gelatin or soup. Progress to regular foods as tolerated. Avoid greasy, spicy, heavy foods. If nausea and/or vomiting occur, drink only clear liquids until the nausea and/or vomiting subsides. Call your physician if vomiting continues. ° °Special Instructions/Symptoms: °Your throat may feel dry or sore from the anesthesia or the breathing tube placed in your throat during surgery. If this causes discomfort, gargle with warm salt water. The discomfort should disappear within 24 hours. ° °If you had a scopolamine patch placed behind your ear for the management of post- operative nausea and/or vomiting: ° °1. The medication in the patch is effective for 72 hours, after which it should be removed.  Wrap patch in a tissue and discard in the trash. Wash hands thoroughly with soap and water. °2. You may remove the patch earlier than 72 hours if you experience unpleasant side effects which may include dry mouth, dizziness or visual disturbances. °3. Avoid touching the patch. Wash your hands with soap and water after contact with the patch. °  ° ° °Regional   Anesthesia Blocks ° °1. Numbness or the inability to move the "blocked" extremity may last from 3-48 hours after placement. The length of time depends on the medication injected and your individual response to the medication. If the numbness is not going away after 48 hours, call your surgeon. ° °2. The extremity that is blocked will need to be protected until the numbness is gone and the  Strength has returned. Because you cannot feel it, you will need to take extra care  to avoid injury. Because it may be weak, you may have difficulty moving it or using it. You may not know what position it is in without looking at it while the block is in effect. ° °3. For blocks in the legs and feet, returning to weight bearing and walking needs to be done carefully. You will need to wait until the numbness is entirely gone and the strength has returned. You should be able to move your leg and foot normally before you try and bear weight or walk. You will need someone to be with you when you first try to ensure you do not fall and possibly risk injury. ° °4. Bruising and tenderness at the needle site are common side effects and will resolve in a few days. ° °5. Persistent numbness or new problems with movement should be communicated to the surgeon or the [facility] ([phone])/ [facility] ([phone]). °

## 2018-04-28 NOTE — Transfer of Care (Signed)
Immediate Anesthesia Transfer of Care Note  Patient: Anne Hayden  Procedure(s) Performed: Right hallux metatarsal Phalangeal joint cheilectomy and resurfacing (Right Foot)  Patient Location: PACU  Anesthesia Type:GA combined with regional for post-op pain  Level of Consciousness: drowsy and patient cooperative  Airway & Oxygen Therapy: Patient Spontanous Breathing and Patient connected to face mask oxygen  Post-op Assessment: Report given to RN and Post -op Vital signs reviewed and stable  Post vital signs: Reviewed and stable  Last Vitals:  Vitals Value Taken Time  BP    Temp    Pulse 62 04/28/2018 10:23 AM  Resp 8 04/28/2018 10:23 AM  SpO2 100 % 04/28/2018 10:23 AM  Vitals shown include unvalidated device data.  Last Pain:  Vitals:   04/28/18 0812  TempSrc: Oral  PainSc: 0-No pain         Complications: No apparent anesthesia complications

## 2018-04-28 NOTE — Anesthesia Postprocedure Evaluation (Signed)
Anesthesia Post Note  Patient: SAPHIA VANDERFORD  Procedure(s) Performed: Right hallux metatarsal Phalangeal joint cheilectomy and resurfacing (Right Foot)     Patient location during evaluation: PACU Anesthesia Type: General Level of consciousness: awake and alert Pain management: pain level controlled Vital Signs Assessment: post-procedure vital signs reviewed and stable Respiratory status: spontaneous breathing, nonlabored ventilation, respiratory function stable and patient connected to nasal cannula oxygen Cardiovascular status: blood pressure returned to baseline and stable Postop Assessment: no apparent nausea or vomiting Anesthetic complications: no    Last Vitals:  Vitals:   04/28/18 1115 04/28/18 1130  BP: 108/65 94/68  Pulse: 68 66  Resp: 17 13  Temp:    SpO2: 100% 100%    Last Pain:  Vitals:   04/28/18 1130  TempSrc:   PainSc: 0-No pain                 Kashayla Ungerer

## 2018-04-28 NOTE — Anesthesia Preprocedure Evaluation (Addendum)
Anesthesia Evaluation  Patient identified by MRN, date of birth, ID band Patient awake    Reviewed: Allergy & Precautions, H&P , NPO status , Patient's Chart, lab work & pertinent test results, reviewed documented beta blocker date and time   Airway Mallampati: II  TM Distance: >3 FB Neck ROM: full    Dental no notable dental hx.    Pulmonary neg pulmonary ROS,    Pulmonary exam normal breath sounds clear to auscultation       Cardiovascular Exercise Tolerance: Good negative cardio ROS   Rhythm:regular Rate:Normal     Neuro/Psych PSYCHIATRIC DISORDERS Anxiety Depression    GI/Hepatic negative GI ROS, Neg liver ROS,   Endo/Other  negative endocrine ROS  Renal/GU negative Renal ROS  negative genitourinary   Musculoskeletal  (+) Arthritis , Osteoarthritis,    Abdominal   Peds  Hematology negative hematology ROS (+)   Anesthesia Other Findings   Reproductive/Obstetrics negative OB ROS                            Anesthesia Physical Anesthesia Plan  ASA: II  Anesthesia Plan: General   Post-op Pain Management: GA combined w/ Regional for post-op pain   Induction: Intravenous  PONV Risk Score and Plan: 3 and Ondansetron, Treatment may vary due to age or medical condition, Dexamethasone and Midazolam  Airway Management Planned: LMA  Additional Equipment:   Intra-op Plan:   Post-operative Plan: Extubation in OR  Informed Consent: I have reviewed the patients History and Physical, chart, labs and discussed the procedure including the risks, benefits and alternatives for the proposed anesthesia with the patient or authorized representative who has indicated his/her understanding and acceptance.   Dental Advisory Given  Plan Discussed with: CRNA, Anesthesiologist and Surgeon  Anesthesia Plan Comments: ( )        Anesthesia Quick Evaluation

## 2018-04-28 NOTE — H&P (Signed)
Anne Hayden is an 47 y.o. female.   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  She has failed nonoperative treatment to date including activity modification, oral anti-inflammatories and shoewear modification.  She presents now for surgical treatment of this painful condition.  Past Medical History:  Diagnosis Date  . Allergy   . Depression with anxiety    est. with psychiatry, Dr. [name]. In the past she reports she has been told she was bipolar and OCD  . OCD (obsessive compulsive disorder)   . Osteoarthritis   . Vitamin D deficiency     Past Surgical History:  Procedure Laterality Date  . 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 COLPOSCOPY  2004,2005,2012,2018    Family History  Problem Relation Age of Onset  . Arthritis Mother   . Hypertension Mother   . Heart disease Father   . Stroke Father   . Polycythemia Father   . Breast cancer Sister 72  . Breast cancer Maternal Grandmother 86  . Melanoma Maternal Grandfather   . COPD Maternal Grandfather   . Diabetes Paternal Grandmother   . Heart disease Paternal Grandfather   . Bipolar disorder Paternal Uncle    Social History:  reports that she has never smoked. She has never used smokeless tobacco. She reports that she drank alcohol. She reports that she does not use drugs.  Allergies: No Known Allergies  Medications Prior to Admission  Medication Sig Dispense Refill  . citalopram (CELEXA) 40 MG tablet Take 1 tablet (40 mg total) by mouth daily. 90 tablet 0  . clindamycin (CLEOCIN T) 1 % lotion clindamycin 1 % lotion    . clonazePAM (KLONOPIN) 0.5 MG tablet Use twice daily and may use 1 daily PRN (not more than 15 x a month) 75 tablet 1  . lamoTRIgine (LAMICTAL) 150 MG tablet Take 1 tablet (150 mg total) by mouth daily. 90 tablet 0  . norethindrone-ethinyl estradiol (BALZIVA) 0.4-35 MG-MCG tablet Take by  mouth.    . Sod Fluoride-Potassium Nitrate (PREVIDENT 5000 SENSITIVE) 1.1-5 % PSTE PreviDent 5000 Sensitive 1.1 %-5 % dental paste    . sodium fluoride (PREVIDENT) 1.1 % GEL dental gel PreviDent 5000 Dry Mouth 1.1 % gel  BRUSH TEETH X2 MINUTES TWICE A DAY & SPIT. DONT RINSE, EAT, OR DRINK FOR 30 MINUTES    . tretinoin (RETIN-A) 0.1 % cream tretinoin 0.1 % topical cream  APPLY A PEA SIZE AMOUNT TO FULL FACE AT BEDTIME    . ketoconazole (NIZORAL) 2 % shampoo ketoconazole 2 % shampoo    . valACYclovir (VALTREX) 500 MG tablet 1 po BID prn      Results for orders placed or performed during the hospital encounter of 04/28/18 (from the past 48 hour(s))  Pregnancy, urine POC     Status: None   Collection Time: 04/28/18  8:06 AM  Result Value Ref Range   Preg Test, Ur NEGATIVE NEGATIVE    Comment:        THE SENSITIVITY OF THIS METHODOLOGY IS >24 mIU/mL    No results found.  ROS no recent fever, chills, nausea, vomiting or changes in her appetite  Blood pressure 126/82, pulse 76, temperature 98.3 F (36.8 C), temperature source Oral, resp. rate 11, height 5\' 1"  (1.549 m), weight 42.1 kg, last menstrual period 04/20/2018, SpO2 100 %. Physical Exam  Well-nourished well-developed woman in no apparent  distress.  Alert and oriented x4.  Mood and affect are normal.  Extraocular motions are intact.  Respirations are unlabored.  Gait is normal.  The right hallux has decreased range of motion at the MP joint.  Tender to palpation over the MP joint.  Skin is healthy and intact.  No lymphadenopathy.  Pulses are palpable.  Sensibility to light touch is intact dorsally and plantarly at the forefoot.  Assessment/Plan Right hallux rigidus -to the operating room today for hallux MP joint cheilectomy and resurfacing.  The risks and benefits of the alternative treatment options have been discussed in detail.  The patient wishes to proceed with surgery and specifically understands risks of bleeding, infection,  nerve damage, blood clots, need for additional surgery, amputation and death.  Toni Arthurs, MD 12-May-2018, 9:35 AM

## 2018-04-28 NOTE — Anesthesia Procedure Notes (Signed)
Anesthesia Regional Block: Popliteal block   Pre-Anesthetic Checklist: ,, timeout performed, Correct Patient, Correct Site, Correct Laterality, Correct Procedure, Correct Position, site marked, Risks and benefits discussed,  Surgical consent,  Pre-op evaluation,  At surgeon's request and post-op pain management  Laterality: Right  Prep: chloraprep       Needles:  Injection technique: Single-shot  Needle Type: Echogenic Stimulator Needle     Needle Length: 5cm  Needle Gauge: 22     Additional Needles:   Procedures:, nerve stimulator,,, ultrasound used (permanent image in chart),,,,  Narrative:  Start time: 04/28/2018 8:40 AM End time: 04/28/2018 8:50 AM Injection made incrementally with aspirations every 5 mL.  Performed by: Personally  Anesthesiologist: Bethena Midget, MD  Additional Notes: Functioning IV was confirmed and monitors were applied.  A 50mm 22ga Arrow echogenic stimulator needle was used. Sterile prep and drape,hand hygiene and sterile gloves were used. Ultrasound guidance: relevant anatomy identified, needle position confirmed, local anesthetic spread visualized around nerve(s)., vascular puncture avoided.  Image printed for medical record. Negative aspiration and negative test dose prior to incremental administration of local anesthetic. The patient tolerated the procedure well.

## 2018-04-28 NOTE — Progress Notes (Signed)
Assisted Dr. Tacy Dura with right, ultrasound guided popliteal block. Side rails up, monitors on throughout procedure. See vital signs in flow sheet. Tolerated Procedure well.

## 2018-04-29 ENCOUNTER — Ambulatory Visit: Payer: Self-pay | Admitting: *Deleted

## 2018-04-29 NOTE — Telephone Encounter (Signed)
Pt called with having a red rash on both sides of her face. Pt denies fever or itching. States her face feels hot to touch. She was wondering if it could be related to wearing a mask from surgery yesterday (she had foot surgery) or having her lamotrigine increase to 150 mg tablet. It was increased on 04/26/18. Notified flow at [facility]. Will route this to the office and have her pcp give her a call back. If pt has increased in symptoms, such as respiratory distress, fever, increase in rash or not just not feeling well, she is advised to call back and be directed the closes emergency facility.  Pt voiced understanding.  Reason for Disposition . Caller has URGENT medication question about med that PCP prescribed and triager unable to answer question  Answer Assessment - Initial Assessment Questions 1. APPEARANCE of RASH: "Describe the rash."      red 2. LOCATION: "Where is the rash located?"      Both cheeks 3. NUMBER: "How many spots are there?"      [place] area 4. SIZE: "How big are the spots?" (Inches, centimeters or compare to size of a coin)      Large area 5. ONSET: "When did the rash start?"      This morning 6. ITCHING: "Does the rash itch?" If so, ask: "How bad is the itch?"  (Scale 1-10; or mild, moderate, severe)     No itching 7. PAIN: "Does the rash hurt?" If so, ask: "How bad is the pain?"  (Scale 1-10; or mild, moderate, severe)     No pain 8. OTHER SYMPTOMS: "Do you have any other symptoms?" (e.g., fever)     No fever 9. PREGNANCY: "Is there any chance you are pregnant?" "When was your last menstrual period?"     No had period last week  Answer Assessment - Initial Assessment Questions 1. SYMPTOMS: "Do you have any symptoms?"     Red rash on both side of face 2. SEVERITY: If symptoms are present, ask "Are they mild, moderate or severe?"     moderate  Protocols used: MEDICATION QUESTION CALL-A-AH, RASH OR REDNESS - LOCALIZED-A-AH

## 2018-04-29 NOTE — Telephone Encounter (Signed)
Spoke with patient offered to an appointment at [facility] Saturday clinic she declined she states she will just go to an UC to get checked she denies any swelling states she just has a red rash.

## 2018-05-02 ENCOUNTER — Encounter
Admission: RE | Admit: 2018-05-02 | Discharge: 2018-05-02 | Disposition: A | Discharge status: Home / Self Care | Payer: 59 | Source: Ambulatory Visit | Attending: Psychiatry | Admitting: Psychiatry

## 2018-05-02 ENCOUNTER — Ambulatory Visit
Admission: RE | Admit: 2018-05-02 | Discharge: 2018-05-02 | Disposition: A | Discharge status: Home / Self Care | Payer: 59 | Source: Ambulatory Visit | Attending: Psychiatry | Admitting: Psychiatry

## 2018-05-02 ENCOUNTER — Other Ambulatory Visit: Payer: Self-pay

## 2018-05-02 ENCOUNTER — Telehealth (HOSPITAL_COMMUNITY): Payer: Self-pay

## 2018-05-02 ENCOUNTER — Telehealth: Payer: Self-pay

## 2018-05-02 DIAGNOSIS — J8410 Pulmonary fibrosis, unspecified: Secondary | ICD-10-CM | POA: Insufficient documentation

## 2018-05-02 DIAGNOSIS — Z01810 Encounter for preprocedural cardiovascular examination: Secondary | ICD-10-CM

## 2018-05-02 DIAGNOSIS — Z01812 Encounter for preprocedural laboratory examination: Secondary | ICD-10-CM | POA: Diagnosis not present

## 2018-05-02 DIAGNOSIS — Z01818 Encounter for other preprocedural examination: Secondary | ICD-10-CM | POA: Diagnosis not present

## 2018-05-02 LAB — URINALYSIS, ROUTINE W REFLEX MICROSCOPIC
Bacteria, UA: NONE SEEN
Bilirubin Urine: NEGATIVE
Glucose, UA: NEGATIVE mg/dL
Ketones, ur: NEGATIVE mg/dL
LEUKOCYTES UA: NEGATIVE
Nitrite: NEGATIVE
PROTEIN: NEGATIVE mg/dL
Specific Gravity, Urine: 1.002 — ABNORMAL LOW (ref 1.005–1.030)
WBC UA: NONE SEEN WBC/hpf (ref 0–5)
pH: 7 (ref 5.0–8.0)

## 2018-05-02 LAB — CBC
HCT: 40.1 % (ref 36.0–46.0)
Hemoglobin: 13.1 g/dL (ref 12.0–15.0)
MCH: 29.5 pg (ref 26.0–34.0)
MCHC: 32.7 g/dL (ref 30.0–36.0)
MCV: 90.3 fL (ref 80.0–100.0)
NRBC: 0 % (ref 0.0–0.2)
PLATELETS: 344 10*3/uL (ref 150–400)
RBC: 4.44 MIL/uL (ref 3.87–5.11)
RDW: 12.1 % (ref 11.5–15.5)
WBC: 6 10*3/uL (ref 4.0–10.5)

## 2018-05-02 LAB — BASIC METABOLIC PANEL
ANION GAP: 8 (ref 5–15)
BUN: 9 mg/dL (ref 6–20)
CALCIUM: 8.9 mg/dL (ref 8.9–10.3)
CO2: 29 mmol/L (ref 22–32)
Chloride: 103 mmol/L (ref 98–111)
Creatinine, Ser: 0.76 mg/dL (ref 0.44–1.00)
GFR calc Af Amer: >60 mL/min (ref >60)
GLUCOSE: 114 mg/dL — AB (ref 70–99)
POTASSIUM: 3.4 mmol/L — AB (ref 3.5–5.1)
Sodium: 140 mmol/L (ref 135–145)

## 2018-05-02 NOTE — Telephone Encounter (Signed)
Patient is calling to report a redness, rash on her face. Patient states that it is on both cheeks, and while the redness is about gone now, it still feels bumpy. Patient started Lamictal at her last appointment on 10/15. Patient also mentioned she had surgery on Thursday on her foot. Please review and advise.

## 2018-05-02 NOTE — Pre-Procedure Instructions (Signed)
ECT instruction sheet provided. Patient verbalized understanding

## 2018-05-02 NOTE — Telephone Encounter (Signed)
She need to stop the Lamictal and closely monitor the rash.  If the rash did not go away then she need to go to the emergency room.

## 2018-05-05 NOTE — Telephone Encounter (Signed)
Contacted patient and she stated that the rash on her face is gone, she believes it may have been a new soap - She has not developed any other rashes and will stop the medication immediately if it does reoccur.

## 2018-05-06 ENCOUNTER — Other Ambulatory Visit: Payer: Self-pay | Admitting: Psychiatry

## 2018-05-06 ENCOUNTER — Encounter: Payer: Self-pay | Admitting: Anesthesiology

## 2018-05-06 ENCOUNTER — Inpatient Hospital Stay: Admission: RE | Admit: 2018-05-06 | Payer: Self-pay | Source: Ambulatory Visit

## 2018-05-08 ENCOUNTER — Other Ambulatory Visit: Payer: Self-pay | Admitting: Psychiatry

## 2018-05-09 ENCOUNTER — Encounter: Payer: Self-pay | Admitting: Anesthesiology

## 2018-05-09 ENCOUNTER — Ambulatory Visit
Admission: RE | Admit: 2018-05-09 | Discharge: 2018-05-09 | Disposition: A | Discharge status: Home / Self Care | Payer: 59 | Source: Ambulatory Visit | Attending: Psychiatry | Admitting: Psychiatry

## 2018-05-09 DIAGNOSIS — F332 Major depressive disorder, recurrent severe without psychotic features: Secondary | ICD-10-CM | POA: Diagnosis not present

## 2018-05-09 MED ORDER — SUCCINYLCHOLINE CHLORIDE 200 MG/10ML IV SOSY
PREFILLED_SYRINGE | INTRAVENOUS | Status: DC | PRN
  Administered 2018-05-09: 80 mg via INTRAVENOUS

## 2018-05-09 MED ORDER — SODIUM CHLORIDE 0.9 % IV SOLN
500.0000 mL | Freq: Once | INTRAVENOUS | Status: AC
  Administered 2018-05-09 (×2): via INTRAVENOUS

## 2018-05-09 MED ORDER — SUCCINYLCHOLINE CHLORIDE 20 MG/ML IJ SOLN
INTRAMUSCULAR | Status: AC
  Filled 2018-05-09: qty 1

## 2018-05-09 MED ORDER — METHOHEXITAL SODIUM 100 MG/10ML IV SOSY
PREFILLED_SYRINGE | INTRAVENOUS | Status: DC | PRN
  Administered 2018-05-09: 50 mg via INTRAVENOUS

## 2018-05-09 NOTE — Transfer of Care (Signed)
Immediate Anesthesia Transfer of Care Note  Patient: Anne Hayden  Procedure(s) Performed: ECT TX  Patient Location: PACU  Anesthesia Type:General  Level of Consciousness: sedated  Airway & Oxygen Therapy: Patient Spontanous Breathing and Patient connected to face mask oxygen  Post-op Assessment: Report given to RN and Post -op Vital signs reviewed and stable  Post vital signs: Reviewed and stable  Last Vitals:  Vitals Value Taken Time  BP 126/74 05/09/2018 11:32 AM  Temp    Pulse 113 05/09/2018 11:35 AM  Resp 18 05/09/2018 11:35 AM  SpO2 100 % 05/09/2018 11:35 AM  Vitals shown include unvalidated device data.  Last Pain:  Vitals:   05/09/18 0904  TempSrc: Oral  PainSc: 0-No pain         Complications: No apparent anesthesia complications

## 2018-05-09 NOTE — H&P (Signed)
Anne Hayden is an 47 y.o. female.   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 Allergy   . Depression with anxiety    est. with psychiatry, Dr. [name]. In the past she reports she has been told she was bipolar and OCD  . OCD (obsessive compulsive disorder)   . Osteoarthritis   . Vitamin D deficiency     Past Surgical History:  Procedure Laterality Date  . 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 COLPOSCOPY  2004,2005,2012,2018    Family History  Problem Relation Age of Onset  . Arthritis Mother   . Hypertension Mother   . Heart disease Father   . Stroke Father   . Polycythemia Father   . Breast cancer Sister 24  . Breast cancer Maternal Grandmother 65  . Melanoma Maternal Grandfather   . COPD Maternal Grandfather   . Diabetes Paternal Grandmother   . Heart disease Paternal Grandfather   . Bipolar disorder Paternal Uncle    Social History:  reports that she has never smoked. She has never used smokeless tobacco. She reports that she drank alcohol. She reports that she does not use drugs.  Allergies: No Known Allergies   (Not in a hospital admission)  No results found for this or any previous visit (from the past 48 hour(s)). No results found.  Review of Systems  Constitutional: Negative.   HENT: Negative.   Eyes: Negative.   Respiratory: Negative.   Cardiovascular: Negative.   Gastrointestinal: Negative.   Musculoskeletal: Negative.   Skin: Negative.   Neurological: Negative.   Psychiatric/Behavioral: Positive for depression and suicidal ideas. Negative for hallucinations, memory loss and substance abuse. The patient is not nervous/anxious and does not have insomnia.     Blood pressure 125/71, pulse 98, temperature 97.7 F (36.5 C), temperature source Oral, resp. rate 16, height 5\' 1"  (1.549 m), weight  42.1 kg, last menstrual period 04/20/2018, SpO2 98 %. Physical Exam  Nursing note and vitals reviewed. Constitutional: She appears well-developed and well-nourished.  HENT:  Head: Normocephalic and atraumatic.  Eyes: Pupils are equal, round, and reactive to light. Conjunctivae are normal.  Neck: Normal range of motion.  Cardiovascular: Regular rhythm and normal heart sounds.  Respiratory: Effort normal.  GI: Soft.  Musculoskeletal: Normal range of motion.  Neurological: She is alert.  Skin: Skin is warm and dry.  Psychiatric: Judgment normal. Her affect is blunt. Her speech is delayed. She is slowed. Cognition and memory are normal. She expresses suicidal ideation. She expresses no suicidal plans.     Assessment/Plan Right unilateral ECT index course starting today  Mordecai Rasmussen, MD 05/09/2018, 11:11 AM

## 2018-05-09 NOTE — Anesthesia Postprocedure Evaluation (Signed)
Anesthesia Post Note  Patient: Anne Hayden  Procedure(s) Performed: ECT TX  Patient location during evaluation: PACU Anesthesia Type: General Level of consciousness: awake and alert Pain management: pain level controlled Vital Signs Assessment: post-procedure vital signs reviewed and stable Respiratory status: spontaneous breathing, nonlabored ventilation and respiratory function stable Cardiovascular status: blood pressure returned to baseline and stable Postop Assessment: no signs of nausea or vomiting Anesthetic complications: no     Last Vitals:  Vitals:   05/09/18 1217 05/09/18 1224  BP:  121/79  Pulse: (!) 112 (!) 102  Resp: 14 16  Temp:  37.1 C  SpO2: 100%     Last Pain:  Vitals:   05/09/18 1224  TempSrc: Oral  PainSc:                  Linde Wilensky

## 2018-05-09 NOTE — Procedures (Signed)
ECT SERVICES Physician's Interval Evaluation & Treatment Note  Patient Identification: Anne Hayden MRN:  [id number] Date of Evaluation:  05/09/2018 TX #: 1  MADRS: 35  MMSE: 30  P.E. Findings:  Normal physical normal vitals heart and lungs normal  Psychiatric Interval Note:  Major depression severe recurrent with passive suicidal thoughts but no active intent or plan  Subjective:  Patient is a 47 y.o. female seen for evaluation for Electroconvulsive Therapy. Continues to be depressed  Treatment Summary:   [x]   Right Unilateral             []  Bilateral   % Energy : 0.3 ms 45%   Impedance: 1550 ohms  Seizure Energy Index: 24,579 V squared  Postictal Suppression Index: 96%  Seizure Concordance Index: 97%  Medications  Pre Shock: Brevital 50 mg succinylcholine 80 mg  Post Shock:    Seizure Duration: EMG 49 seconds EEG 51 seconds   Comments: Follow-up Wednesday Friday  Lungs:  [x]   Clear to auscultation               []  Other:   Heart:    [x]   Regular rhythm             []  irregular rhythm    [x]   Previous H&P reviewed, patient examined and there are NO CHANGES                 []   Previous H&P reviewed, patient examined and there are changes noted.   Mordecai Rasmussen, MD 10/28/201911:13 AM

## 2018-05-09 NOTE — Anesthesia Preprocedure Evaluation (Signed)
Anesthesia Evaluation  Patient identified by MRN, date of birth, ID band Patient awake    Reviewed: Allergy & Precautions, NPO status , Patient's Chart, lab work & pertinent test results  History of Anesthesia Complications Negative for: history of anesthetic complications  Airway Mallampati: II  TM Distance: >3 FB Neck ROM: Full    Dental no notable dental hx.    Pulmonary neg pulmonary ROS, neg sleep apnea, neg COPD,    breath sounds clear to auscultation- rhonchi (-) wheezing      Cardiovascular Exercise Tolerance: Good (-) hypertension(-) CAD, (-) Past MI, (-) Cardiac Stents and (-) CABG  Rhythm:Regular Rate:Normal - Systolic murmurs and - Diastolic murmurs    Neuro/Psych PSYCHIATRIC DISORDERS Anxiety Depression negative neurological ROS     GI/Hepatic negative GI ROS, Neg liver ROS,   Endo/Other  negative endocrine ROSneg diabetes  Renal/GU negative Renal ROS     Musculoskeletal  (+) Arthritis ,   Abdominal (+) - obese,   Peds  Hematology negative hematology ROS (+)   Anesthesia Other Findings Past Medical History: No date: Allergy No date: Depression with anxiety     Comment:  est. with psychiatry, Dr. [name]. In the past she               reports she has been told she was bipolar and OCD No date: OCD (obsessive compulsive disorder) No date: Osteoarthritis No date: Vitamin D deficiency   Reproductive/Obstetrics                             Anesthesia Physical Anesthesia Plan  ASA: II  Anesthesia Plan: General   Post-op Pain Management:    Induction: Intravenous  PONV Risk Score and Plan: 2 and Ondansetron  Airway Management Planned: Mask  Additional Equipment:   Intra-op Plan:   Post-operative Plan:   Informed Consent: I have reviewed the patients History and Physical, chart, labs and discussed the procedure including the risks, benefits and alternatives for the  proposed anesthesia with the patient or authorized representative who has indicated his/her understanding and acceptance.   Dental advisory given  Plan Discussed with: CRNA and Anesthesiologist  Anesthesia Plan Comments:         Anesthesia Quick Evaluation

## 2018-05-09 NOTE — Discharge Instructions (Signed)
1)  The drugs that you have been given will stay in your system until tomorrow so for the       next 24 hours you should not:  A. Drive an automobile  B. Make any legal decisions  C. Drink any alcoholic beverages  2)  You may resume your regular meals upon return home.  3)  A responsible adult must take you home.  Someone should stay with you for a few          hours, then be available by phone for the remainder of the treatment day.  4)  You May experience any of the following symptoms:  Headache, Nausea and a dry mouth (due to the medications you were given),  temporary memory loss and some confusion, or sore muscles (a warm bath  should help this).  If you you experience any of these symptoms let us know on                your return visit.  5)  Report any of the following: any acute discomfort, severe headache, or temperature        greater than 100.5 F.   Also report any unusual redness, swelling, drainage, or pain         at your IV site.    You may report Symptoms to:  ECT PROGRAM- [place] [facility]          Phone: [phone], ECT Department           or Dr. Shary Key office [phone]  6)  Your next ECT Treatment is Wednesday, October 30  8:45  We will call 2 days prior to your scheduled appointment for arrival times.  7)  Nothing to eat or drink after midnight the night before your procedure.  8)  Take    With a sip of water the morning of your procedure.  9)  Other Instructions: Call [phone] to cancel the morning of your procedure due         to illness or emergency.  10) We will call within 72 hours to assess how you are feeling.

## 2018-05-09 NOTE — Progress Notes (Signed)
Pt sinus tachy at 113. Per Eunice Blase, RN, she spoke with Dr. Priscella Mann who is agreeable to discharge patient with this HR.

## 2018-05-09 NOTE — Anesthesia Procedure Notes (Signed)
Date/Time: 05/09/2018 11:22 AM Performed by: Lily Kocher, CRNA Pre-anesthesia Checklist: Patient identified, Emergency Drugs available, Suction available and Patient being monitored Patient Re-evaluated:Patient Re-evaluated prior to induction Oxygen Delivery Method: Circle system utilized Preoxygenation: Pre-oxygenation with 100% oxygen Induction Type: IV induction Ventilation: Mask ventilation without difficulty and Mask ventilation throughout procedure Airway Equipment and Method: Bite block Placement Confirmation: positive ETCO2 Dental Injury: Teeth and Oropharynx as per pre-operative assessment

## 2018-05-10 ENCOUNTER — Ambulatory Visit: Payer: Self-pay | Admitting: Obstetrics & Gynecology

## 2018-05-10 ENCOUNTER — Other Ambulatory Visit: Payer: Self-pay | Admitting: Psychiatry

## 2018-05-11 ENCOUNTER — Ambulatory Visit: Payer: Self-pay | Admitting: Anesthesiology

## 2018-05-11 ENCOUNTER — Encounter: Payer: Self-pay | Admitting: Anesthesiology

## 2018-05-11 ENCOUNTER — Encounter
Admission: RE | Admit: 2018-05-11 | Discharge: 2018-05-11 | Disposition: A | Discharge status: Home / Self Care | Payer: 59 | Source: Ambulatory Visit | Attending: Psychiatry | Admitting: Psychiatry

## 2018-05-11 DIAGNOSIS — F332 Major depressive disorder, recurrent severe without psychotic features: Secondary | ICD-10-CM

## 2018-05-11 DIAGNOSIS — E559 Vitamin D deficiency, unspecified: Secondary | ICD-10-CM | POA: Insufficient documentation

## 2018-05-11 DIAGNOSIS — M1990 Unspecified osteoarthritis, unspecified site: Secondary | ICD-10-CM | POA: Diagnosis not present

## 2018-05-11 DIAGNOSIS — F329 Major depressive disorder, single episode, unspecified: Secondary | ICD-10-CM | POA: Insufficient documentation

## 2018-05-11 DIAGNOSIS — F429 Obsessive-compulsive disorder, unspecified: Secondary | ICD-10-CM | POA: Insufficient documentation

## 2018-05-11 DIAGNOSIS — F419 Anxiety disorder, unspecified: Secondary | ICD-10-CM | POA: Diagnosis present

## 2018-05-11 MED ORDER — METHOHEXITAL SODIUM 100 MG/10ML IV SOSY
PREFILLED_SYRINGE | INTRAVENOUS | Status: DC | PRN
  Administered 2018-05-11: 50 mg via INTRAVENOUS

## 2018-05-11 MED ORDER — ONDANSETRON HCL 4 MG/2ML IJ SOLN
INTRAMUSCULAR | Status: AC
  Filled 2018-05-11: qty 2

## 2018-05-11 MED ORDER — METHOHEXITAL SODIUM 0.5 G IJ SOLR
INTRAMUSCULAR | Status: AC
  Filled 2018-05-11: qty 500

## 2018-05-11 MED ORDER — SUCCINYLCHOLINE CHLORIDE 20 MG/ML IJ SOLN
INTRAMUSCULAR | Status: AC
  Filled 2018-05-11: qty 1

## 2018-05-11 MED ORDER — GLYCOPYRROLATE 0.2 MG/ML IJ SOLN
INTRAMUSCULAR | Status: AC
  Administered 2018-05-11: 0.2 mg via INTRAVENOUS
  Filled 2018-05-11: qty 1

## 2018-05-11 MED ORDER — SODIUM CHLORIDE 0.9 % IV SOLN
INTRAVENOUS | Status: DC | PRN
  Administered 2018-05-11: 09:00:00 via INTRAVENOUS

## 2018-05-11 MED ORDER — SUCCINYLCHOLINE CHLORIDE 200 MG/10ML IV SOSY
PREFILLED_SYRINGE | INTRAVENOUS | Status: DC | PRN
  Administered 2018-05-11: 80 mg via INTRAVENOUS

## 2018-05-11 MED ORDER — ONDANSETRON HCL 4 MG/2ML IJ SOLN
4.0000 mg | Freq: Once | INTRAMUSCULAR | Status: AC
  Administered 2018-05-11: 4 mg via INTRAVENOUS

## 2018-05-11 MED ORDER — DEXAMETHASONE SODIUM PHOSPHATE 10 MG/ML IJ SOLN
INTRAMUSCULAR | Status: DC | PRN
  Administered 2018-05-11: 5 mg via INTRAVENOUS

## 2018-05-11 MED ORDER — GLYCOPYRROLATE 0.2 MG/ML IJ SOLN
0.2000 mg | Freq: Once | INTRAMUSCULAR | Status: AC
  Administered 2018-05-11: 0.2 mg via INTRAVENOUS

## 2018-05-11 MED ORDER — DEXAMETHASONE SODIUM PHOSPHATE 10 MG/ML IJ SOLN
INTRAMUSCULAR | Status: AC
  Filled 2018-05-11: qty 1

## 2018-05-11 MED ORDER — SODIUM CHLORIDE 0.9 % IV SOLN
500.0000 mL | Freq: Once | INTRAVENOUS | Status: AC
  Administered 2018-05-11: 500 mL via INTRAVENOUS

## 2018-05-11 NOTE — Procedures (Signed)
ECT SERVICES Physician's Interval Evaluation & Treatment Note  Patient Identification: Anne Hayden MRN:  [id number] Date of Evaluation:  05/11/2018 TX #: 2  MADRS:   MMSE:   P.E. Findings: No change to physical exam  Psychiatric Interval Note:  Continues to be depressed not actively suicidal  Subjective:  Patient is a 47 y.o. female seen for evaluation for Electroconvulsive Therapy. Had a little nausea and headache after last treatment  Treatment Summary:   [x]   Right Unilateral             []  Bilateral   % Energy : 0.3 ms 45%   Impedance: 1680 ohms  Seizure Energy Index: 16,030 V squared  Postictal Suppression Index: 71%  Seizure Concordance Index: 98%  Medications  Pre Shock: Zofran 4 mg Brevital 50 mg succinylcholine 80 mg  Post Shock:    Seizure Duration: 23 seconds EMG 28 seconds EEG   Comments: Follow-up Friday  Lungs:  [x]   Clear to auscultation               []  Other:   Heart:    [x]   Regular rhythm             []  irregular rhythm    [x]   Previous H&P reviewed, patient examined and there are NO CHANGES                 []   Previous H&P reviewed, patient examined and there are changes noted.   Mordecai Rasmussen, MD 10/30/201910:19 AM

## 2018-05-11 NOTE — H&P (Signed)
Anne Hayden is an 47 y.o. female.   Chief Complaint: Major depression severe recurrent without HPI: History of recurrent severe depression starting ECT today is treatment #2  Past Medical History:  Diagnosis Date  . Allergy   . Depression with anxiety    est. with psychiatry, Dr. [name]. In the past she reports she has been told she was bipolar and OCD  . OCD (obsessive compulsive disorder)   . Osteoarthritis   . Vitamin D deficiency     Past Surgical History:  Procedure Laterality Date  . 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 COLPOSCOPY  2004,2005,2012,2018    Family History  Problem Relation Age of Onset  . Arthritis Mother   . Hypertension Mother   . Heart disease Father   . Stroke Father   . Polycythemia Father   . Breast cancer Sister 24  . Breast cancer Maternal Grandmother 52  . Melanoma Maternal Grandfather   . COPD Maternal Grandfather   . Diabetes Paternal Grandmother   . Heart disease Paternal Grandfather   . Bipolar disorder Paternal Uncle    Social History:  reports that she has never smoked. She has never used smokeless tobacco. She reports that she drank alcohol. She reports that she does not use drugs.  Allergies: No Known Allergies   (Not in a hospital admission)  No results found for this or any previous visit (from the past 48 hour(s)). No results found.  Review of Systems  Constitutional: Negative.   HENT: Negative.   Eyes: Negative.   Respiratory: Negative.   Cardiovascular: Negative.   Gastrointestinal: Negative.   Musculoskeletal: Negative.   Skin: Negative.   Neurological: Negative.   Psychiatric/Behavioral: Positive for depression. Negative for hallucinations, memory loss, substance abuse and suicidal ideas. The patient is not nervous/anxious and does not have insomnia.     Blood pressure 115/66, pulse 70, temperature 98.7 F (37.1 C), temperature source Oral, resp. rate 14, height 5\' 1"  (1.549 m), weight 41.7 kg,  last menstrual period 04/20/2018. Physical Exam  Nursing note and vitals reviewed. Constitutional: She appears well-developed and well-nourished.  HENT:  Head: Normocephalic and atraumatic.  Eyes: Pupils are equal, round, and reactive to light. Conjunctivae are normal.  Neck: Normal range of motion.  Cardiovascular: Regular rhythm and normal heart sounds.  Respiratory: Effort normal. No respiratory distress.  GI: Soft.  Musculoskeletal: Normal range of motion.  Neurological: She is alert.  Skin: Skin is warm and dry.  Psychiatric: She has a normal mood and affect. Her behavior is normal. Judgment and thought content normal.     Assessment/Plan ECT today continue 3 times a week schedule going forward  Mordecai Rasmussen, MD 05/11/2018, 10:15 AM

## 2018-05-11 NOTE — Transfer of Care (Signed)
Immediate Anesthesia Transfer of Care Note  Patient: Anne Hayden  Procedure(s) Performed: ECT TX  Patient Location: PACU  Anesthesia Type:General  Level of Consciousness: sedated  Airway & Oxygen Therapy: Patient Spontanous Breathing and Patient connected to face mask oxygen  Post-op Assessment: Report given to RN and Post -op Vital signs reviewed and stable  Post vital signs: Reviewed and stable  Last Vitals:  Vitals Value Taken Time  BP 127/68 05/11/2018 10:35 AM  Temp    Pulse 93 05/11/2018 10:37 AM  Resp 14 05/11/2018 10:37 AM  SpO2 100 % 05/11/2018 10:37 AM  Vitals shown include unvalidated device data.  Last Pain:  Vitals:   05/11/18 0912  TempSrc:   PainSc: 0-No pain         Complications: No apparent anesthesia complications

## 2018-05-11 NOTE — Discharge Instructions (Signed)
1)  The drugs that you have been given will stay in your system until tomorrow so for the       next 24 hours you should not:  A. Drive an automobile  B. Make any legal decisions  C. Drink any alcoholic beverages  2)  You may resume your regular meals upon return home.  3)  A responsible adult must take you home.  Someone should stay with you for a few          hours, then be available by phone for the remainder of the treatment day.  4)  You May experience any of the following symptoms:  Headache, Nausea and a dry mouth (due to the medications you were given),  temporary memory loss and some confusion, or sore muscles (a warm bath  should help this).  If you you experience any of these symptoms let us know on                your return visit.  5)  Report any of the following: any acute discomfort, severe headache, or temperature        greater than 100.5 F.   Also report any unusual redness, swelling, drainage, or pain         at your IV site.    You may report Symptoms to:  ECT PROGRAM- [place] at [facility]          Phone: [phone], ECT Department           or Dr. Shary Key office [phone]  6)  Your next ECT Treatment is Friday November 1   We will call 2 days prior to your scheduled appointment for arrival times.  7)  Nothing to eat or drink after midnight the night before your procedure.  8)  Take     With a sip of water the morning of your procedure.  9)  Other Instructions: Call [phone] to cancel the morning of your procedure due         to illness or emergency.  10) We will call within 72 hours to assess how you are feeling.

## 2018-05-11 NOTE — Anesthesia Preprocedure Evaluation (Signed)
Anesthesia Evaluation  Patient identified by MRN, date of birth, ID band Patient awake    Reviewed: Allergy & Precautions, NPO status , Patient's Chart, lab work & pertinent test results  History of Anesthesia Complications Negative for: history of anesthetic complications  Airway Mallampati: II  TM Distance: >3 FB Neck ROM: Full    Dental no notable dental hx.    Pulmonary neg pulmonary ROS, neg sleep apnea, neg COPD,    breath sounds clear to auscultation- rhonchi (-) wheezing      Cardiovascular Exercise Tolerance: Good (-) hypertension(-) CAD, (-) Past MI, (-) Cardiac Stents and (-) CABG  - Systolic murmurs and - Diastolic murmurs    Neuro/Psych PSYCHIATRIC DISORDERS Anxiety Depression negative neurological ROS     GI/Hepatic negative GI ROS, Neg liver ROS,   Endo/Other  negative endocrine ROSneg diabetes  Renal/GU negative Renal ROS     Musculoskeletal  (+) Arthritis ,   Abdominal (+) - obese,   Peds  Hematology negative hematology ROS (+)   Anesthesia Other Findings Past Medical History: No date: Allergy No date: Depression with anxiety     Comment:  est. with psychiatry, Dr. [name]. In the past she               reports she has been told she was bipolar and OCD No date: OCD (obsessive compulsive disorder) No date: Osteoarthritis No date: Vitamin D deficiency   Reproductive/Obstetrics                             Anesthesia Physical  Anesthesia Plan  ASA: II  Anesthesia Plan: General   Post-op Pain Management:    Induction: Intravenous  PONV Risk Score and Plan: 2 and Ondansetron and Dexamethasone  Airway Management Planned: Mask  Additional Equipment:   Intra-op Plan:   Post-operative Plan:   Informed Consent: I have reviewed the patients History and Physical, chart, labs and discussed the procedure including the risks, benefits and alternatives for the proposed  anesthesia with the patient or authorized representative who has indicated his/her understanding and acceptance.   Dental advisory given  Plan Discussed with: CRNA and Anesthesiologist  Anesthesia Plan Comments:         Anesthesia Quick Evaluation

## 2018-05-11 NOTE — Anesthesia Post-op Follow-up Note (Signed)
Anesthesia QCDR form completed.        

## 2018-05-12 ENCOUNTER — Other Ambulatory Visit: Payer: Self-pay | Admitting: Psychiatry

## 2018-05-12 NOTE — Anesthesia Postprocedure Evaluation (Signed)
Anesthesia Post Note  Patient: Anne Hayden  Procedure(s) Performed: ECT TX  Patient location during evaluation: PACU Anesthesia Type: General Level of consciousness: awake and alert Pain management: pain level controlled Vital Signs Assessment: post-procedure vital signs reviewed and stable Respiratory status: spontaneous breathing, nonlabored ventilation and respiratory function stable Cardiovascular status: blood pressure returned to baseline and stable Postop Assessment: no apparent nausea or vomiting Anesthetic complications: no     Last Vitals:  Vitals:   05/11/18 1115 05/11/18 1132  BP: 111/64 115/65  Pulse: 91 89  Resp: 20 16  Temp: 37.2 C 36.9 C  SpO2: 99%     Last Pain:  Vitals:   05/11/18 1132  TempSrc: Oral  PainSc: 0-No pain                 Jovita Gamma

## 2018-05-13 ENCOUNTER — Encounter: Payer: Self-pay | Admitting: Anesthesiology

## 2018-05-13 ENCOUNTER — Encounter
Admission: RE | Admit: 2018-05-13 | Discharge: 2018-05-13 | Disposition: A | Discharge status: Home / Self Care | Payer: 59 | Source: Ambulatory Visit | Attending: Psychiatry | Admitting: Psychiatry

## 2018-05-13 ENCOUNTER — Telehealth (HOSPITAL_COMMUNITY): Payer: Self-pay | Admitting: *Deleted

## 2018-05-13 DIAGNOSIS — F332 Major depressive disorder, recurrent severe without psychotic features: Secondary | ICD-10-CM | POA: Diagnosis not present

## 2018-05-13 DIAGNOSIS — M1990 Unspecified osteoarthritis, unspecified site: Secondary | ICD-10-CM | POA: Diagnosis not present

## 2018-05-13 DIAGNOSIS — F429 Obsessive-compulsive disorder, unspecified: Secondary | ICD-10-CM | POA: Diagnosis not present

## 2018-05-13 DIAGNOSIS — E559 Vitamin D deficiency, unspecified: Secondary | ICD-10-CM | POA: Diagnosis not present

## 2018-05-13 DIAGNOSIS — Z3202 Encounter for pregnancy test, result negative: Secondary | ICD-10-CM | POA: Diagnosis not present

## 2018-05-13 DIAGNOSIS — F418 Other specified anxiety disorders: Secondary | ICD-10-CM | POA: Diagnosis not present

## 2018-05-13 MED ORDER — DEXAMETHASONE SODIUM PHOSPHATE 10 MG/ML IJ SOLN
INTRAMUSCULAR | Status: AC
  Filled 2018-05-13: qty 1

## 2018-05-13 MED ORDER — ONDANSETRON HCL 4 MG/2ML IJ SOLN
4.0000 mg | Freq: Once | INTRAMUSCULAR | Status: AC
  Administered 2018-05-13: 4 mg via INTRAVENOUS

## 2018-05-13 MED ORDER — SUCCINYLCHOLINE CHLORIDE 20 MG/ML IJ SOLN
INTRAMUSCULAR | Status: DC | PRN
  Administered 2018-05-13: 80 mg via INTRAVENOUS

## 2018-05-13 MED ORDER — ONDANSETRON HCL 4 MG/2ML IJ SOLN
INTRAMUSCULAR | Status: AC
  Filled 2018-05-13: qty 2

## 2018-05-13 MED ORDER — SODIUM CHLORIDE 0.9 % IV SOLN
INTRAVENOUS | Status: DC | PRN
  Administered 2018-05-13: 10:00:00 via INTRAVENOUS

## 2018-05-13 MED ORDER — ONDANSETRON HCL 4 MG/2ML IJ SOLN: 4.0000 mg | Freq: Once | INTRAMUSCULAR | Status: DC | PRN

## 2018-05-13 MED ORDER — METHOHEXITAL SODIUM 100 MG/10ML IV SOSY
PREFILLED_SYRINGE | INTRAVENOUS | Status: DC | PRN
  Administered 2018-05-13: 50 mg via INTRAVENOUS

## 2018-05-13 MED ORDER — SODIUM CHLORIDE 0.9 % IV SOLN
500.0000 mL | Freq: Once | INTRAVENOUS | Status: AC
  Administered 2018-05-13: 500 mL via INTRAVENOUS

## 2018-05-13 MED ORDER — SUCCINYLCHOLINE CHLORIDE 20 MG/ML IJ SOLN
INTRAMUSCULAR | Status: AC
  Filled 2018-05-13: qty 1

## 2018-05-13 NOTE — Discharge Instructions (Signed)
1)  The drugs that you have been given will stay in your system until tomorrow so for the       next 24 hours you should not:  A. Drive an automobile  B. Make any legal decisions  C. Drink any alcoholic beverages  2)  You may resume your regular meals upon return home.  3)  A responsible adult must take you home.  Someone should stay with you for a few          hours, then be available by phone for the remainder of the treatment day.  4)  You May experience any of the following symptoms:  Headache, Nausea and a dry mouth (due to the medications you were given),  temporary memory loss and some confusion, or sore muscles (a warm bath  should help this).  If you you experience any of these symptoms let us know on                your return visit.  5)  Report any of the following: any acute discomfort, severe headache, or temperature        greater than 100.5 F.   Also report any unusual redness, swelling, drainage, or pain         at your IV site.    You may report Symptoms to:  ECT PROGRAM- [place] at [facility]          Phone: [phone], ECT Department           or Dr. Shary Key office [phone]  6)  Your next ECT Treatment is Monday November 4 at 8:45  We will call 2 days prior to your scheduled appointment for arrival times.  7)  Nothing to eat or drink after midnight the night before your procedure.  8)  Take      With a sip of water the morning of your procedure.  9)  Other Instructions: Call [phone] to cancel the morning of your procedure due         to illness or emergency.  10) We will call within 72 hours to assess how you are feeling.

## 2018-05-13 NOTE — Anesthesia Post-op Follow-up Note (Signed)
Anesthesia QCDR form completed.        

## 2018-05-13 NOTE — Transfer of Care (Signed)
Immediate Anesthesia Transfer of Care Note  Patient: Anne Hayden  Procedure(s) Performed: ECT TX  Patient Location: PACU  Anesthesia Type:General  Level of Consciousness: awake and alert   Airway & Oxygen Therapy: Patient Spontanous Breathing and Patient connected to face mask oxygen  Post-op Assessment: Report given to RN and Post -op Vital signs reviewed and stable  Post vital signs: Reviewed and stable  Last Vitals:  Vitals Value Taken Time  BP    Temp    Pulse 72 05/13/2018 10:38 AM  Resp 14 05/13/2018 10:38 AM  SpO2 98 % 05/13/2018 10:38 AM  Vitals shown include unvalidated device data.  Last Pain:  Vitals:   05/13/18 0853  TempSrc: Oral  PainSc: 0-No pain         Complications: No apparent anesthesia complications

## 2018-05-13 NOTE — H&P (Signed)
Anne Hayden is an 47 y.o. female.   Chief Complaint: Patient with major depression.  Mood still a little bit down.  No acute nausea. HPI: History of recurrent severe depression  Past Medical History:  Diagnosis Date  . Allergy   . Depression with anxiety    est. with psychiatry, Dr. [name]. In the past she reports she has been told she was bipolar and OCD  . OCD (obsessive compulsive disorder)   . Osteoarthritis   . Vitamin D deficiency     Past Surgical History:  Procedure Laterality Date  . 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 COLPOSCOPY  2004,2005,2012,2018    Family History  Problem Relation Age of Onset  . Arthritis Mother   . Hypertension Mother   . Heart disease Father   . Stroke Father   . Polycythemia Father   . Breast cancer Sister 42  . Breast cancer Maternal Grandmother 36  . Melanoma Maternal Grandfather   . COPD Maternal Grandfather   . Diabetes Paternal Grandmother   . Heart disease Paternal Grandfather   . Bipolar disorder Paternal Uncle    Social History:  reports that she has never smoked. She has never used smokeless tobacco. She reports that she drank alcohol. She reports that she does not use drugs.  Allergies: No Known Allergies   (Not in a hospital admission)  No results found for this or any previous visit (from the past 48 hour(s)). No results found.  Review of Systems  Constitutional: Negative.   HENT: Negative.   Eyes: Negative.   Respiratory: Negative.   Cardiovascular: Negative.   Gastrointestinal: Negative.   Musculoskeletal: Negative.   Skin: Negative.   Neurological: Negative.   Psychiatric/Behavioral: Positive for depression. Negative for hallucinations, memory loss, substance abuse and suicidal ideas. The patient is not nervous/anxious and does not have insomnia.     Blood pressure 119/77, pulse 73, temperature 97.7 F (36.5 C), temperature source Oral, resp. rate 14, height 5\' 1"  (1.549 m), weight 42.6 kg,  last menstrual period 04/20/2018, SpO2 99 %. Physical Exam  Nursing note and vitals reviewed. Constitutional: She appears well-developed and well-nourished.  HENT:  Head: Normocephalic and atraumatic.  Eyes: Pupils are equal, round, and reactive to light. Conjunctivae are normal.  Neck: Normal range of motion.  Cardiovascular: Regular rhythm and normal heart sounds.  Respiratory: Effort normal.  GI: Soft.  Musculoskeletal: Normal range of motion.  Neurological: She is alert.  Skin: Skin is warm and dry.  Psychiatric: Judgment normal. Her affect is blunt. Her speech is delayed. She is slowed. Thought content is not paranoid. Cognition and memory are normal. She expresses no homicidal and no suicidal ideation.     Assessment/Plan Follow-up in 3 days to continue the index course  Mordecai Rasmussen, MD 05/13/2018, 10:24 AM

## 2018-05-13 NOTE — Anesthesia Preprocedure Evaluation (Signed)
Anesthesia Evaluation  Patient identified by MRN, date of birth, ID band Patient awake    Reviewed: Allergy & Precautions, NPO status , Patient's Chart, lab work & pertinent test results  History of Anesthesia Complications Negative for: history of anesthetic complications  Airway Mallampati: II  TM Distance: >3 FB Neck ROM: Full    Dental no notable dental hx.    Pulmonary neg pulmonary ROS, neg sleep apnea, neg COPD,    breath sounds clear to auscultation- rhonchi (-) wheezing      Cardiovascular Exercise Tolerance: Good (-) hypertension(-) CAD, (-) Past MI, (-) Cardiac Stents and (-) CABG  - Systolic murmurs and - Diastolic murmurs    Neuro/Psych PSYCHIATRIC DISORDERS Anxiety Depression negative neurological ROS     GI/Hepatic negative GI ROS, Neg liver ROS,   Endo/Other  negative endocrine ROSneg diabetes  Renal/GU negative Renal ROS     Musculoskeletal  (+) Arthritis ,   Abdominal (+) - obese,   Peds  Hematology negative hematology ROS (+)   Anesthesia Other Findings   Reproductive/Obstetrics                             Anesthesia Physical  Anesthesia Plan  ASA: II  Anesthesia Plan: General   Post-op Pain Management:    Induction: Intravenous  PONV Risk Score and Plan: 2 and Ondansetron and Dexamethasone  Airway Management Planned: Mask  Additional Equipment:   Intra-op Plan:   Post-operative Plan:   Informed Consent: I have reviewed the patients History and Physical, chart, labs and discussed the procedure including the risks, benefits and alternatives for the proposed anesthesia with the patient or authorized representative who has indicated his/her understanding and acceptance.     Plan Discussed with: CRNA and Anesthesiologist  Anesthesia Plan Comments:         Anesthesia Quick Evaluation  

## 2018-05-13 NOTE — Telephone Encounter (Signed)
Called [facility] for ECT authorization spoke with multiple people and transferred multiple times, was hung up on and told to complete it online. Called back and spoke to another representative who attempted to assist in getting are advocate. Finally spoke with Alycia Rossetti who gave 6 units from 05/09/18-11/08/18 Auth #[id number].

## 2018-05-13 NOTE — Anesthesia Procedure Notes (Signed)
Performed by: Hewitt Garner, CRNA Pre-anesthesia Checklist: Patient identified, Patient being monitored, Timeout performed, Emergency Drugs available and Suction available Patient Re-evaluated:Patient Re-evaluated prior to induction Oxygen Delivery Method: Circle system utilized Preoxygenation: Pre-oxygenation with 100% oxygen Ventilation: Mask ventilation without difficulty Airway Equipment and Method: Bite block Dental Injury: Teeth and Oropharynx as per pre-operative assessment        

## 2018-05-13 NOTE — Procedures (Signed)
ECT SERVICES Physician's Interval Evaluation & Treatment Note  Patient Identification: Anne Hayden MRN:  [id number] Date of Evaluation:  05/13/2018 TX #: 3  MADRS:   MMSE:   P.E. Findings:  No change to physical exam  Psychiatric Interval Note:  Patient vaguely feels there may be some difference.  No complaints of any memory problems  Subjective:  Patient is a 47 y.o. female seen for evaluation for Electroconvulsive Therapy. No specific complaints.  Nausea was improved  Treatment Summary:   [x]   Right Unilateral             []  Bilateral   % Energy : 0.3 mg 45%   Impedance: 1950 ohms  Seizure Energy Index: 21,806 V squared  Postictal Suppression Index: 94%  Seizure Concordance Index: 99%  Medications  Pre Shock: Zofran 4 mg Brevital 50 mg succinylcholine 80 mg  Post Shock:    Seizure Duration: EMG 23 seconds EEG 36 seconds   Comments: Follow-up on Monday  Lungs:  [x]   Clear to auscultation               []  Other:   Heart:    [x]   Regular rhythm             []  irregular rhythm    [x]   Previous H&P reviewed, patient examined and there are NO CHANGES                 []   Previous H&P reviewed, patient examined and there are changes noted.   Mordecai Rasmussen, MD 11/1/201910:25 AM

## 2018-05-13 NOTE — Anesthesia Postprocedure Evaluation (Signed)
Anesthesia Post Note  Patient: Anne Hayden  Procedure(s) Performed: ECT TX  Patient location during evaluation: PACU Anesthesia Type: General Level of consciousness: sedated Pain management: pain level controlled Vital Signs Assessment: post-procedure vital signs reviewed and stable Respiratory status: spontaneous breathing and respiratory function stable Cardiovascular status: stable Anesthetic complications: no     Last Vitals:  Vitals:   05/13/18 1050 05/13/18 1116  BP: (!) 104/58 105/62  Pulse: 94 88  Resp: 14 14  Temp:    SpO2: 96% 100%    Last Pain:  Vitals:   05/13/18 1100  TempSrc:   PainSc: 0-No pain                 KEPHART,WILLIAM K

## 2018-05-16 ENCOUNTER — Ambulatory Visit: Payer: Self-pay | Admitting: Anesthesiology

## 2018-05-16 ENCOUNTER — Encounter: Payer: Self-pay | Admitting: Anesthesiology

## 2018-05-16 ENCOUNTER — Other Ambulatory Visit: Payer: Self-pay | Admitting: Psychiatry

## 2018-05-16 ENCOUNTER — Encounter (HOSPITAL_COMMUNITY)
Admission: RE | Admit: 2018-05-16 | Discharge: 2018-05-16 | Disposition: A | Discharge status: Home / Self Care | Payer: 59 | Source: Ambulatory Visit | Attending: Psychiatry | Admitting: Psychiatry

## 2018-05-16 DIAGNOSIS — F332 Major depressive disorder, recurrent severe without psychotic features: Secondary | ICD-10-CM

## 2018-05-16 LAB — POCT PREGNANCY, URINE: Preg Test, Ur: NEGATIVE

## 2018-05-16 MED ORDER — ONDANSETRON HCL 4 MG/2ML IJ SOLN
4.0000 mg | Freq: Once | INTRAMUSCULAR | Status: AC
  Administered 2018-05-16: 4 mg via INTRAVENOUS

## 2018-05-16 MED ORDER — SODIUM CHLORIDE 0.9 % IV SOLN
500.0000 mL | Freq: Once | INTRAVENOUS | Status: AC
  Administered 2018-05-16: 500 mL via INTRAVENOUS

## 2018-05-16 MED ORDER — ONDANSETRON HCL 4 MG/2ML IJ SOLN: 4.0000 mg | Freq: Once | INTRAMUSCULAR | Status: DC | PRN

## 2018-05-16 MED ORDER — SUCCINYLCHOLINE CHLORIDE 20 MG/ML IJ SOLN
INTRAMUSCULAR | Status: AC
  Filled 2018-05-16: qty 1

## 2018-05-16 MED ORDER — METHOHEXITAL SODIUM 100 MG/10ML IV SOSY
PREFILLED_SYRINGE | INTRAVENOUS | Status: DC | PRN
  Administered 2018-05-16: 50 mg via INTRAVENOUS

## 2018-05-16 MED ORDER — SODIUM CHLORIDE 0.9 % IV SOLN
INTRAVENOUS | Status: DC | PRN
  Administered 2018-05-16: 09:00:00 via INTRAVENOUS

## 2018-05-16 MED ORDER — ONDANSETRON HCL 4 MG/2ML IJ SOLN
INTRAMUSCULAR | Status: AC
  Filled 2018-05-16: qty 2

## 2018-05-16 MED ORDER — SUCCINYLCHOLINE CHLORIDE 200 MG/10ML IV SOSY
PREFILLED_SYRINGE | INTRAVENOUS | Status: DC | PRN
  Administered 2018-05-16: 80 mg via INTRAVENOUS

## 2018-05-16 NOTE — Anesthesia Postprocedure Evaluation (Signed)
Anesthesia Post Note  Patient: Anne Hayden  Procedure(s) Performed: ECT TX  Patient location during evaluation: PACU Anesthesia Type: General Level of consciousness: sedated Pain management: pain level controlled Vital Signs Assessment: post-procedure vital signs reviewed and stable Respiratory status: spontaneous breathing and respiratory function stable Cardiovascular status: stable Anesthetic complications: no     Last Vitals:  Vitals:   05/16/18 1115 05/16/18 1116  BP: (!) 106/54   Pulse: 98 93  Resp: 14 10  Temp:    SpO2: 91% 95%    Last Pain:  Vitals:   05/16/18 1106  TempSrc:   PainSc: 0-No pain                 KEPHART,WILLIAM K

## 2018-05-16 NOTE — Transfer of Care (Signed)
Immediate Anesthesia Transfer of Care Note  Patient: Anne Hayden  Procedure(s) Performed: ECT TX  Patient Location: PACU  Anesthesia Type:General  Level of Consciousness: sedated  Airway & Oxygen Therapy: Patient Spontanous Breathing and Patient connected to face mask oxygen  Post-op Assessment: Report given to RN and Post -op Vital signs reviewed and stable  Post vital signs: Reviewed and stable  Last Vitals:  Vitals Value Taken Time  BP    Temp    Pulse 96 05/16/2018 10:58 AM  Resp 27 05/16/2018 10:58 AM  SpO2 93 % 05/16/2018 10:58 AM  Vitals shown include unvalidated device data.  Last Pain:  Vitals:   05/16/18 0928  TempSrc: Oral         Complications: No apparent anesthesia complications

## 2018-05-16 NOTE — Anesthesia Preprocedure Evaluation (Signed)
Anesthesia Evaluation  Patient identified by MRN, date of birth, ID band Patient awake    Reviewed: Allergy & Precautions, NPO status , Patient's Chart, lab work & pertinent test results  History of Anesthesia Complications Negative for: history of anesthetic complications  Airway Mallampati: II  TM Distance: >3 FB Neck ROM: Full    Dental no notable dental hx.    Pulmonary neg pulmonary ROS, neg sleep apnea, neg COPD,    breath sounds clear to auscultation- rhonchi (-) wheezing      Cardiovascular Exercise Tolerance: Good (-) hypertension(-) CAD, (-) Past MI, (-) Cardiac Stents and (-) CABG  - Systolic murmurs and - Diastolic murmurs    Neuro/Psych PSYCHIATRIC DISORDERS Anxiety Depression negative neurological ROS     GI/Hepatic negative GI ROS, Neg liver ROS,   Endo/Other  negative endocrine ROSneg diabetes  Renal/GU negative Renal ROS     Musculoskeletal  (+) Arthritis ,   Abdominal (+) - obese,   Peds  Hematology negative hematology ROS (+)   Anesthesia Other Findings   Reproductive/Obstetrics                             Anesthesia Physical  Anesthesia Plan  ASA: II  Anesthesia Plan: General   Post-op Pain Management:    Induction: Intravenous  PONV Risk Score and Plan: 2 and Ondansetron and Dexamethasone  Airway Management Planned: Mask  Additional Equipment:   Intra-op Plan:   Post-operative Plan:   Informed Consent: I have reviewed the patients History and Physical, chart, labs and discussed the procedure including the risks, benefits and alternatives for the proposed anesthesia with the patient or authorized representative who has indicated his/her understanding and acceptance.     Plan Discussed with: CRNA and Anesthesiologist  Anesthesia Plan Comments:         Anesthesia Quick Evaluation

## 2018-05-16 NOTE — Anesthesia Post-op Follow-up Note (Signed)
Anesthesia QCDR form completed.        

## 2018-05-16 NOTE — H&P (Signed)
Anne Hayden is an 47 y.o. female.   Chief Complaint: Has noticed a little bit of memory impairment HPI: History of recurrent severe depression  Past Medical History:  Diagnosis Date  . Allergy   . Depression with anxiety    est. with psychiatry, Dr. [name]. In the past she reports she has been told she was bipolar and OCD  . OCD (obsessive compulsive disorder)   . Osteoarthritis   . Vitamin D deficiency     Past Surgical History:  Procedure Laterality Date  . 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 COLPOSCOPY  2004,2005,2012,2018    Family History  Problem Relation Age of Onset  . Arthritis Mother   . Hypertension Mother   . Heart disease Father   . Stroke Father   . Polycythemia Father   . Breast cancer Sister 41  . Breast cancer Maternal Grandmother 41  . Melanoma Maternal Grandfather   . COPD Maternal Grandfather   . Diabetes Paternal Grandmother   . Heart disease Paternal Grandfather   . Bipolar disorder Paternal Uncle    Social History:  reports that she has never smoked. She has never used smokeless tobacco. She reports that she drank alcohol. She reports that she does not use drugs.  Allergies: No Known Allergies   (Not in a hospital admission)  Results for orders placed or performed during the hospital encounter of 05/16/18 (from the past 48 hour(s))  Pregnancy, urine POC     Status: None   Collection Time: 05/16/18  9:20 AM  Result Value Ref Range   Preg Test, Ur NEGATIVE NEGATIVE    Comment:        THE SENSITIVITY OF THIS METHODOLOGY IS >24 mIU/mL    No results found.  Review of Systems  Constitutional: Negative.   HENT: Negative.   Eyes: Negative.   Respiratory: Negative.   Cardiovascular: Negative.   Gastrointestinal: Negative.   Musculoskeletal: Negative.   Skin: Negative.   Neurological: Negative.   Psychiatric/Behavioral: The patient is nervous/anxious.     Blood pressure 110/65, pulse 84, temperature 97.9 F (36.6 C),  temperature source Oral, resp. rate 16, height 5\' 1"  (1.549 m), weight 42.7 kg, last menstrual period 04/20/2018, SpO2 99 %. Physical Exam  Nursing note and vitals reviewed. Constitutional: She appears well-developed and well-nourished.  HENT:  Head: Normocephalic and atraumatic.  Eyes: Pupils are equal, round, and reactive to light. Conjunctivae are normal.  Neck: Normal range of motion.  Cardiovascular: Regular rhythm and normal heart sounds.  Respiratory: Effort normal.  GI: Soft.  Musculoskeletal: Normal range of motion.  Neurological: She is alert.  Skin: Skin is warm and dry.  Psychiatric: Judgment normal. Her affect is blunt. Her speech is delayed. She is slowed. Cognition and memory are normal. She expresses no suicidal ideation. She expresses no suicidal plans.     Assessment/Plan Continue treatment through the week at least  Mordecai Rasmussen, MD 05/16/2018, 10:38 AM

## 2018-05-16 NOTE — Procedures (Signed)
ECT SERVICES Physician's Interval Evaluation & Treatment Note  Patient Identification: Anne Hayden MRN:  [id number] Date of Evaluation:  05/16/2018 TX #: 4  MADRS: 22  MMSE: 30  P.E. Findings:  No change physical exam  Psychiatric Interval Note:  Has noticed a little memory impairment  Subjective:  Patient is a 47 y.o. female seen for evaluation for Electroconvulsive Therapy. Not necessarily noticing any mood change  Treatment Summary:   [x]   Right Unilateral             []  Bilateral   % Energy : 0.3 ms 60%   Impedance: 1750 ohms  Seizure Energy Index: 13,093 V squared  Postictal Suppression Index: 88%  Seizure Concordance Index: 90%  Medications  Pre Shock: Zofran 4 mg Brevital 50 mg succinylcholine 80 mg  Post Shock:    Seizure Duration: 19 seconds EMG 20 seconds EEG   Comments: Continue Wednesday and Friday  Lungs:  [x]   Clear to auscultation               []  Other:   Heart:    [x]   Regular rhythm             []  irregular rhythm    [x]   Previous H&P reviewed, patient examined and there are NO CHANGES                 []   Previous H&P reviewed, patient examined and there are changes noted.   Mordecai Rasmussen, MD 11/4/201910:39 AM

## 2018-05-16 NOTE — Discharge Instructions (Signed)
1)  The drugs that you have been given will stay in your system until tomorrow so for the       next 24 hours you should not:  A. Drive an automobile  B. Make any legal decisions  C. Drink any alcoholic beverages  2)  You may resume your regular meals upon return home.  3)  A responsible adult must take you home.  Someone should stay with you for a few          hours, then be available by phone for the remainder of the treatment day.  4)  You May experience any of the following symptoms:  Headache, Nausea and a dry mouth (due to the medications you were given),  temporary memory loss and some confusion, or sore muscles (a warm bath  should help this).  If you you experience any of these symptoms let us know on                your return visit.  5)  Report any of the following: any acute discomfort, severe headache, or temperature        greater than 100.5 F.   Also report any unusual redness, swelling, drainage, or pain         at your IV site.    You may report Symptoms to:  ECT PROGRAM- [facility]          Phone: [phone], ECT Department           or Dr. Shary Key office [phone]  6)  Your next ECT Treatment is Wednesday 0845  We will call 2 days prior to your scheduled appointment for arrival times.  7)  Nothing to eat or drink after midnight the night before your procedure.  8)  Take     With a sip of water the morning of your procedure.  9)  Other Instructions: Call [phone] to cancel the morning of your procedure due         to illness or emergency.  10) We will call within 72 hours to assess how you are feeling.

## 2018-05-18 ENCOUNTER — Encounter (HOSPITAL_BASED_OUTPATIENT_CLINIC_OR_DEPARTMENT_OTHER)
Admission: RE | Admit: 2018-05-18 | Discharge: 2018-05-18 | Disposition: A | Discharge status: Home / Self Care | Payer: 59 | Source: Ambulatory Visit | Attending: Psychiatry | Admitting: Psychiatry

## 2018-05-18 ENCOUNTER — Other Ambulatory Visit: Payer: Self-pay | Admitting: Psychiatry

## 2018-05-18 ENCOUNTER — Encounter: Payer: Self-pay | Admitting: Anesthesiology

## 2018-05-18 DIAGNOSIS — F332 Major depressive disorder, recurrent severe without psychotic features: Secondary | ICD-10-CM | POA: Diagnosis not present

## 2018-05-18 MED ORDER — FENTANYL CITRATE (PF) 100 MCG/2ML IJ SOLN: 25.0000 ug | INTRAMUSCULAR | Status: DC | PRN

## 2018-05-18 MED ORDER — GLYCOPYRROLATE 0.2 MG/ML IJ SOLN: 0.4000 mg | Freq: Once | INTRAMUSCULAR | Status: DC

## 2018-05-18 MED ORDER — SUCCINYLCHOLINE CHLORIDE 20 MG/ML IJ SOLN
INTRAMUSCULAR | Status: AC
  Filled 2018-05-18: qty 1

## 2018-05-18 MED ORDER — SUCCINYLCHOLINE CHLORIDE 200 MG/10ML IV SOSY
PREFILLED_SYRINGE | INTRAVENOUS | Status: DC | PRN
  Administered 2018-05-18: 80 mg via INTRAVENOUS

## 2018-05-18 MED ORDER — SODIUM CHLORIDE 0.9 % IV SOLN
INTRAVENOUS | Status: DC | PRN
  Administered 2018-05-18: 10:00:00 via INTRAVENOUS

## 2018-05-18 MED ORDER — SODIUM CHLORIDE 0.9 % IV SOLN
500.0000 mL | Freq: Once | INTRAVENOUS | Status: AC
  Administered 2018-05-18: 500 mL via INTRAVENOUS

## 2018-05-18 MED ORDER — METHOHEXITAL SODIUM 100 MG/10ML IV SOSY
PREFILLED_SYRINGE | INTRAVENOUS | Status: DC | PRN
  Administered 2018-05-18: 50 mg via INTRAVENOUS

## 2018-05-18 MED ORDER — ONDANSETRON HCL 4 MG/2ML IJ SOLN: 4.0000 mg | Freq: Once | INTRAMUSCULAR | Status: DC | PRN

## 2018-05-18 MED ORDER — SODIUM CHLORIDE 0.9 % IV SOLN: 500.0000 mL | Freq: Once | INTRAVENOUS | Status: AC

## 2018-05-18 NOTE — Anesthesia Postprocedure Evaluation (Signed)
Anesthesia Post Note  Patient: Anne Hayden  Procedure(s) Performed: ECT TX  Patient location during evaluation: PACU Anesthesia Type: General Level of consciousness: awake and alert Pain management: pain level controlled Vital Signs Assessment: post-procedure vital signs reviewed and stable Respiratory status: spontaneous breathing, nonlabored ventilation, respiratory function stable and patient connected to nasal cannula oxygen Cardiovascular status: blood pressure returned to baseline and stable Postop Assessment: no apparent nausea or vomiting Anesthetic complications: no     Last Vitals:  Vitals:   05/18/18 1112 05/18/18 1117  BP: 99/63 106/60  Pulse:    Resp:  16  Temp: 36.8 C 36.8 C  SpO2: 98%     Last Pain:  Vitals:   05/18/18 1117  TempSrc: Oral  PainSc: 0-No pain                 Yevette Edwards

## 2018-05-18 NOTE — Anesthesia Procedure Notes (Signed)
Date/Time: 05/18/2018 10:38 AM Performed by: Lily Kocher, CRNA Pre-anesthesia Checklist: Patient identified, Emergency Drugs available, Suction available and Patient being monitored Patient Re-evaluated:Patient Re-evaluated prior to induction Oxygen Delivery Method: Circle system utilized Preoxygenation: Pre-oxygenation with 100% oxygen Induction Type: IV induction Ventilation: Mask ventilation without difficulty and Mask ventilation throughout procedure Airway Equipment and Method: Bite block Placement Confirmation: positive ETCO2 Dental Injury: Teeth and Oropharynx as per pre-operative assessment

## 2018-05-18 NOTE — H&P (Signed)
Anne Hayden is an 47 y.o. female.   Chief Complaint: Patient comes in for ECT today.  Feels that she is having significant memory impairment. HPI: History of severe recurrent depression currently having an index course of outpatient ECT  Past Medical History:  Diagnosis Date  . Allergy   . Depression with anxiety    est. with psychiatry, Dr. [name]. In the past she reports she has been told she was bipolar and OCD  . OCD (obsessive compulsive disorder)   . Osteoarthritis   . Vitamin D deficiency     Past Surgical History:  Procedure Laterality Date  . 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 COLPOSCOPY  2004,2005,2012,2018    Family History  Problem Relation Age of Onset  . Arthritis Mother   . Hypertension Mother   . Heart disease Father   . Stroke Father   . Polycythemia Father   . Breast cancer Sister 41  . Breast cancer Maternal Grandmother 29  . Melanoma Maternal Grandfather   . COPD Maternal Grandfather   . Diabetes Paternal Grandmother   . Heart disease Paternal Grandfather   . Bipolar disorder Paternal Uncle    Social History:  reports that she has never smoked. She has never used smokeless tobacco. She reports that she drank alcohol. She reports that she does not use drugs.  Allergies: No Known Allergies   (Not in a hospital admission)  No results found for this or any previous visit (from the past 48 hour(s)). No results found.  Review of Systems  Constitutional: Negative.   HENT: Negative.   Eyes: Negative.   Respiratory: Negative.   Cardiovascular: Negative.   Gastrointestinal: Negative.   Musculoskeletal: Negative.   Skin: Negative.   Neurological: Negative.   Psychiatric/Behavioral: Positive for memory loss. Negative for depression and suicidal ideas.    Blood pressure 106/60, pulse 98, temperature 98.2 F (36.8 C), temperature source Oral, resp. rate 16, last menstrual period 04/20/2018, SpO2 98 %. Physical Exam   Constitutional: She appears well-developed and well-nourished.  HENT:  Head: Normocephalic and atraumatic.  Eyes: Pupils are equal, round, and reactive to light. Conjunctivae are normal.  Neck: Normal range of motion.  Cardiovascular: Normal heart sounds.  Respiratory: Effort normal.  GI: Soft.  Musculoskeletal: Normal range of motion.  Neurological: She is alert.  Skin: Skin is warm and dry.  Psychiatric: Judgment normal. Her affect is blunt. Her speech is delayed. She is slowed. She expresses no suicidal ideation. She exhibits abnormal recent memory.     Assessment/Plan Patient feels that she may have had a small amount of improvement.  Rating scales slightly improved.  She is concerned about side effects.  We agreed that she will have treatment on Friday and then conclude the index course.  Mordecai Rasmussen, MD 05/18/2018, 5:25 PM

## 2018-05-18 NOTE — Discharge Instructions (Signed)
1)  The drugs that you have been given will stay in your system until tomorrow so for the       next 24 hours you should not:  A. Drive an automobile  B. Make any legal decisions  C. Drink any alcoholic beverages  2)  You may resume your regular meals upon return home.  3)  A responsible adult must take you home.  Someone should stay with you for a few          hours, then be available by phone for the remainder of the treatment day.  4)  You May experience any of the following symptoms:  Headache, Nausea and a dry mouth (due to the medications you were given),  temporary memory loss and some confusion, or sore muscles (a warm bath  should help this).  If you you experience any of these symptoms let us know on                your return visit.  5)  Report any of the following: any acute discomfort, severe headache, or temperature        greater than 100.5 F.   Also report any unusual redness, swelling, drainage, or pain         at your IV site.    You may report Symptoms to:  ECT PROGRAM- [place] [facility]          Phone: [phone], ECT Department           or Dr. Shary Key office [phone]  6)  Your next ECT Treatment is Friday November 8 at 0830  We will call 2 days prior to your scheduled appointment for arrival times.  7)  Nothing to eat or drink after midnight the night before your procedure.  8)  Take      With a sip of water the morning of your procedure.  9)  Other Instructions: Call [phone] to cancel the morning of your procedure due         to illness or emergency.  10) We will call within 72 hours to assess how you are feeling.

## 2018-05-18 NOTE — Transfer of Care (Signed)
Immediate Anesthesia Transfer of Care Note  Patient: Anne Hayden  Procedure(s) Performed: ECT TX  Patient Location: PACU  Anesthesia Type:General  Level of Consciousness: sedated  Airway & Oxygen Therapy: Patient Spontanous Breathing and Patient connected to face mask oxygen  Post-op Assessment: Report given to RN and Post -op Vital signs reviewed and stable  Post vital signs: Reviewed  Last Vitals:  Vitals Value Taken Time  BP 122/55 05/18/2018 10:48 AM  Temp 36.8 C 05/18/2018 10:48 AM  Pulse 98 05/18/2018 10:51 AM  Resp 15 05/18/2018 10:51 AM  SpO2 100 % 05/18/2018 10:51 AM  Vitals shown include unvalidated device data.  Last Pain:  Vitals:   05/18/18 0855  TempSrc: Oral  PainSc: 2          Complications: No apparent anesthesia complications

## 2018-05-18 NOTE — Anesthesia Preprocedure Evaluation (Signed)
Anesthesia Evaluation  Patient identified by MRN, date of birth, ID band Patient awake    Reviewed: Allergy & Precautions, H&P , NPO status , Patient's Chart, lab work & pertinent test results, reviewed documented beta blocker date and time   Airway Mallampati: II   Neck ROM: full    Dental  (+) Poor Dentition   Pulmonary neg pulmonary ROS,    Pulmonary exam normal        Cardiovascular Exercise Tolerance: Good negative cardio ROS Normal cardiovascular exam Rhythm:regular Rate:Normal     Neuro/Psych PSYCHIATRIC DISORDERS Anxiety Depression negative neurological ROS  negative psych ROS   GI/Hepatic negative GI ROS, Neg liver ROS,   Endo/Other  negative endocrine ROS  Renal/GU negative Renal ROS  negative genitourinary   Musculoskeletal   Abdominal   Peds  Hematology negative hematology ROS (+)   Anesthesia Other Findings Past Medical History: No date: Allergy No date: Depression with anxiety     Comment:  est. with psychiatry, Dr. [name]. In the past she               reports she has been told she was bipolar and OCD No date: OCD (obsessive compulsive disorder) No date: Osteoarthritis No date: Vitamin D deficiency Past Surgical History: 10/08/2016: CERVICAL BIOPSY     Comment:  CIN I-low grade 2004,2005,2012,2018: COLPOSCOPY   Reproductive/Obstetrics negative OB ROS                             Anesthesia Physical Anesthesia Plan  ASA: II  Anesthesia Plan: General   Post-op Pain Management:    Induction:   PONV Risk Score and Plan:   Airway Management Planned:   Additional Equipment:   Intra-op Plan:   Post-operative Plan:   Informed Consent: I have reviewed the patients History and Physical, chart, labs and discussed the procedure including the risks, benefits and alternatives for the proposed anesthesia with the patient or authorized representative who has indicated  his/her understanding and acceptance.   Dental Advisory Given  Plan Discussed with: CRNA  Anesthesia Plan Comments:         Anesthesia Quick Evaluation

## 2018-05-18 NOTE — Anesthesia Post-op Follow-up Note (Signed)
Anesthesia QCDR form completed.        

## 2018-05-18 NOTE — Procedures (Signed)
ECT SERVICES Physician's Interval Evaluation & Treatment Note  Patient Identification: Anne Hayden MRN:  [id number] Date of Evaluation:  05/18/2018 TX #: 5  MADRS:   MMSE:   P.E. Findings:  No change to physical exam  Psychiatric Interval Note:  Affect seems about the same mood slightly improved.  Complaining of memory impairment but does not seem to be confused certainly not delirious  Subjective:  Patient is a 47 y.o. female seen for evaluation for Electroconvulsive Therapy. Subjective complaints about memory impairment  Treatment Summary:   [x]   Right Unilateral             []  Bilateral   % Energy : 0.3 ms 90%   Impedance: 2240 ohms  Seizure Energy Index: 15,236 V squared  Postictal Suppression Index: 84%  Seizure Concordance Index: 79%  Medications  Pre Shock: Zofran 4 mg Brevital 50 mg succinylcholine 80 mg  Post Shock:    Seizure Duration: 17 seconds by EMG 19 seconds by EEG   Comments: Patient's seizures are getting shorter anyway.  I think that it is probably a very good decision on her part to stop treatment after Friday.  We will see her back at that time and reassess how she is doing.  Lungs:  [x]   Clear to auscultation               []  Other:   Heart:    [x]   Regular rhythm             []  irregular rhythm    [x]   Previous H&P reviewed, patient examined and there are NO CHANGES                 []   Previous H&P reviewed, patient examined and there are changes noted.   Mordecai Rasmussen, MD 11/6/20195:27 PM

## 2018-05-19 ENCOUNTER — Other Ambulatory Visit: Payer: Self-pay | Admitting: Psychiatry

## 2018-06-07 ENCOUNTER — Encounter (HOSPITAL_COMMUNITY): Payer: Self-pay | Admitting: Psychiatry

## 2018-06-07 ENCOUNTER — Ambulatory Visit (INDEPENDENT_AMBULATORY_CARE_PROVIDER_SITE_OTHER): Payer: 59 | Admitting: Psychiatry

## 2018-06-07 ENCOUNTER — Ambulatory Visit (HOSPITAL_COMMUNITY): Payer: 59 | Admitting: Psychiatry

## 2018-06-07 VITALS — BP 109/70 | HR 83 | Ht 61.0 in | Wt 91.0 lb

## 2018-06-07 DIAGNOSIS — F429 Obsessive-compulsive disorder, unspecified: Secondary | ICD-10-CM | POA: Diagnosis not present

## 2018-06-07 DIAGNOSIS — F331 Major depressive disorder, recurrent, moderate: Secondary | ICD-10-CM

## 2018-06-07 DIAGNOSIS — F411 Generalized anxiety disorder: Secondary | ICD-10-CM

## 2018-06-07 MED ORDER — ARIPIPRAZOLE 2 MG PO TABS: 2.0000 mg | ORAL_TABLET | Freq: Every day | ORAL | 1 refills | Status: DC

## 2018-06-07 MED ORDER — CITALOPRAM HYDROBROMIDE 40 MG PO TABS: 40.0000 mg | ORAL_TABLET | Freq: Every day | ORAL | 0 refills | Status: DC

## 2018-06-07 MED ORDER — CLONAZEPAM 0.5 MG PO TABS: ORAL_TABLET | ORAL | 1 refills | Status: DC

## 2018-06-07 NOTE — Progress Notes (Signed)
BH MD/PA/NP OP Progress Note  06/07/2018 2:15 PM Anne HomansJennifer S Hayden  MRN:  [id number]  Chief Complaint: I have no more suicidal thoughts.  ECT helps but I am scared to do more because I started to have issues.  I still have no motivation to do things.    HPI: Anne Hayden came for her follow-up appointment.  She had 5 ECT treatment and after that she stopped because she started having memory issues.  But she felt her suicidal thoughts are better and she has no more these thoughts.  She is frustrated because she has difficulty walking because of pain in her toe.  She is unable to drive and her mother is taking her to the doctor's appointment.  She had a surgery in October and since then she has not fully recovered.  She continued to endorse lack of motivation, desire to do things.  She stays most of the time by herself.  She is no longer taking Lamictal because when dose increase she had a rash and she was scared and decided to stop.  She is not sure if rash is related to Lamictal but she does not want to take any chance.  She is taking Celexa, Klonopin.  She denies any paranoia or any hallucination but continues to have obsessive and ruminative thoughts.  Her energy level is low.  Her appetite is fair.  Her weight is a stable.  She wanted to visit her husband who lives in [place] but due to pain in her toe she decided not to visit.  Visit Diagnosis:    ICD-10-CM   1. Generalized anxiety disorder F41.1 clonazePAM (KLONOPIN) 0.5 MG tablet  2. Obsessive-compulsive disorder, unspecified type F42.9 clonazePAM (KLONOPIN) 0.5 MG tablet    citalopram (CELEXA) 40 MG tablet    ARIPiprazole (ABILIFY) 2 MG tablet  3. MDD (major depressive disorder), recurrent episode, moderate (HCC) F33.1 citalopram (CELEXA) 40 MG tablet    ARIPiprazole (ABILIFY) 2 MG tablet    Past Psychiatric History:  History of anxiety and depression most of her life. [facility] due to severe depression and  suicidal thoughts. No history of suicidal attempt. Saw psychiatrist on and off and prescribed Seroquel that caused weight gain, Risperdal caused nausea, Trintellix cause itching, Geodon, Neurontin and Paxil did not help. Tried Lamictal when she was in [place] but could not afford when she returned to [place]. History of obsessive thoughts but no paranoia or psychosis. Recall history of manic symptoms when she felt increased energy, extreme irritability, hyper and too much excitement for 2 weeks.  Past Medical History:  Past Medical History:  Diagnosis Date  . Allergy   . Depression with anxiety    est. with psychiatry, Dr. Lolly MustacheArfeen. In the past she reports she has been told she was bipolar and OCD  . OCD (obsessive compulsive disorder)   . Osteoarthritis   . Vitamin D deficiency     Past Surgical History:  Procedure Laterality Date  . 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 COLPOSCOPY  2004,2005,2012,2018    Family Psychiatric History: Reviewed.  Family History:  Family History  Problem Relation Age of Onset  . Arthritis Mother   . Hypertension Mother   . Heart disease Father   . Stroke Father   . Polycythemia Father   . Breast cancer Sister [id number]  . Breast cancer Maternal Grandmother 1870  . Melanoma Maternal Grandfather   . COPD Maternal Grandfather   . Diabetes  Paternal Grandmother   . Heart disease Paternal Grandfather   . Bipolar disorder Paternal Uncle     Social History:  Social History   Socioeconomic History  . Marital status: Married    Spouse name: markus  . Number of children: 0  . Years of education: Not on file  . Highest education level: Master's degree (e.g., MA, MS, MEng, MEd, MSW, MBA)  Occupational History  . Not on file  Social Needs  . Financial resource strain: Not hard at all  . Food insecurity:    Worry: Never true    Inability: Never true  . Transportation needs:    Medical: No    Non-medical: No  Tobacco Use  . Smoking status: Never  Smoker  . Smokeless tobacco: Never Used  Substance and Sexual Activity  . Alcohol use: Not Currently    Frequency: Never    Comment: rare  . Drug use: Never  . Sexual activity: Yes    Partners: Male  Lifestyle  . Physical activity:    Days per week: 7 days    Minutes per session: 10 min  . Stress: Very much  Relationships  . Social connections:    Talks on phone: More than three times a week    Gets together: Once a week    Attends religious service: Never    Active member of club or organization: No    Attends meetings of clubs or organizations: Never    Relationship status: Married  Other Topics Concern  . Not on file  Social History Narrative   Married. Husband lives in [place] through the week. She recently moved to [place] to be closer to her family.    Masters of Arts degree. Unemployed.     Allergies: No Known Allergies  Metabolic Disorder Labs: No results found for: HGBA1C, MPG No results found for: PROLACTIN No results found for: CHOL, TRIG, HDL, CHOLHDL, VLDL, LDLCALC No results found for: TSH  Therapeutic Level Labs: No results found for: LITHIUM No results found for: VALPROATE No components found for:  CBMZ  Current Medications: Current Outpatient Medications  Medication Sig Dispense Refill  . citalopram (CELEXA) 40 MG tablet Take 1 tablet (40 mg total) by mouth daily. 90 tablet 0  . clindamycin (CLEOCIN T) 1 % lotion clindamycin 1 % lotion    . clonazePAM (KLONOPIN) 0.5 MG tablet Use twice daily and may use 1 daily PRN (not more than 15 x a month) 75 tablet 1  . ketoconazole (NIZORAL) 2 % shampoo ketoconazole 2 % shampoo    . norethindrone-ethinyl estradiol (BALZIVA) 0.4-35 MG-MCG tablet Take by mouth.    . Sod Fluoride-Potassium Nitrate (PREVIDENT 5000 SENSITIVE) 1.1-5 % PSTE PreviDent 5000 Sensitive 1.1 %-5 % dental paste    . sodium fluoride (PREVIDENT) 1.1 % GEL dental gel PreviDent 5000 Dry Mouth 1.1 % gel  BRUSH TEETH X2 MINUTES TWICE A DAY & SPIT. DONT  RINSE, EAT, OR DRINK FOR 30 MINUTES    . tretinoin (RETIN-A) 0.1 % cream tretinoin 0.1 % topical cream  APPLY A PEA SIZE AMOUNT TO FULL FACE AT BEDTIME    . valACYclovir (VALTREX) 500 MG tablet 1 po BID prn    . docusate sodium (COLACE) 100 MG capsule Take 1 capsule (100 mg total) by mouth 2 (two) times daily. While taking narcotic pain medicine. (Patient not taking: Reported on 06/07/2018) 30 capsule 0  . lamoTRIgine (LAMICTAL) 150 MG tablet Take 1 tablet (150 mg total) by mouth daily. (Patient  not taking: Reported on 06/07/2018) 90 tablet 0  . senna (SENOKOT) 8.6 MG TABS tablet Take 2 tablets (17.2 mg total) by mouth 2 (two) times daily. (Patient not taking: Reported on 06/07/2018) 30 each 0   No current facility-administered medications for this visit.      Musculoskeletal: Strength & Muscle Tone: within normal limits Gait & Station: difficulty walking because of pain Patient leans: N/A  Psychiatric Specialty Exam: ROS  Blood pressure 109/70, pulse 83, height 5\' 1"  (1.549 m), weight 91 lb (41.3 kg), SpO2 98 %.Body mass index is 17.19 kg/m.  General Appearance: Casual and Neat  Eye Contact:  Good  Speech:  Clear and Coherent and Slow  Volume:  Normal  Mood:  Anxious  Affect:  Congruent  Thought Process:  Descriptions of Associations: Intact  Orientation:  Full (Time, Place, and Person)  Thought Content: Obsessions and Rumination   Suicidal Thoughts:  No  Homicidal Thoughts:  No  Memory:  Immediate;   Fair Recent;   Fair Remote;   Fair  Judgement:  Fair  Insight:  Present  Psychomotor Activity:  Decreased  Concentration:  Concentration: Fair and Attention Span: Fair  Recall:  [employer] of Knowledge: Good  Language: Good  Akathisia:  No  Handed:  Right  AIMS (if indicated): not done  Assets:  Communication Skills Desire for Improvement Housing Social Support  ADL's:  Intact  Cognition: WNL  Sleep:  Fair   Screenings: ECT-MADRS     ECT Treatment from 05/16/2018  [facility] ECT Treatment from 05/09/2018 in [facility]  MADRS Total Score  22  35    Mini-Mental     ECT Treatment from 05/16/2018 in [facility] ECT Treatment from 05/09/2018 in [facility]  Total Score (max 30 points )  30  30    PHQ2-9     Office Visit from 01/26/2018 in [facility] Total Score  2  PHQ-9 Total Score  7       Assessment and Plan: Generalized anxiety disorder.  Major depressive disorder, recurrent.  Obsessive-compulsive disorder.  I reviewed notes from other provider.  She has 5 ECT treatment and she feel her suicidal thoughts are better but she is scared to do more treatment because she started to have forgetfulness.  She is no longer taking Lamictal.  She has no more rash.  It is unclear if the Lamictal because the rash.  I recommended to try low-dose Abilify to help the depression which she has never tried before.  Continue Klonopin 0.5 mg twice a day third tablet as needed for severe anxiety.  Continue Celexa 40 mg daily.  We will hold the Lamictal for now and patient will consider if Abilify did not help.  After some encouragement she agreed to see a therapist in this office and we will schedule appointment to see a counselor.  I recommended to call us back if he has any question or any concern.  Follow-up in 2 months.   Cleotis Nipper, MD 06/07/2018, 2:15 PM

## 2018-06-13 ENCOUNTER — Ambulatory Visit (INDEPENDENT_AMBULATORY_CARE_PROVIDER_SITE_OTHER): Payer: 59 | Admitting: Family Medicine

## 2018-06-13 ENCOUNTER — Encounter: Payer: Self-pay | Admitting: Family Medicine

## 2018-06-13 VITALS — BP 110/78 | HR 77 | Temp 97.7°F | Resp 16 | Ht 61.0 in | Wt 92.0 lb

## 2018-06-13 DIAGNOSIS — H6983 Other specified disorders of Eustachian tube, bilateral: Secondary | ICD-10-CM | POA: Diagnosis not present

## 2018-06-13 MED ORDER — FLUTICASONE PROPIONATE 50 MCG/ACT NA SUSP: 2.0000 | Freq: Every day | NASAL | 6 refills | Status: DC

## 2018-06-13 NOTE — Patient Instructions (Addendum)
Flonase nasal spray use daily as directed. Follow up in 4 weeks if the lymph nodes are not improved.  Try the maneuver I showed you today,  A few times a day.   Eustachian Tube Dysfunction The eustachian tube connects the middle ear to the back of the nose. It regulates air pressure in the middle ear by allowing air to move between the ear and nose. It also helps to drain fluid from the middle ear space. When the eustachian tube does not function properly, air pressure, fluid, or both can build up in the middle ear. Eustachian tube dysfunction can affect one or both ears. What are the causes? This condition happens when the eustachian tube becomes blocked or cannot open normally. This may result from:  Ear infections.  Colds and other upper respiratory infections.  Allergies.  Irritation, such as from cigarette smoke or acid from the stomach coming up into the esophagus (gastroesophageal reflux).  Sudden changes in air pressure, such as from descending in an airplane.  Abnormal growths in the nose or throat, such as nasal polyps, tumors, or enlarged tissue at the back of the throat (adenoids).  What increases the risk? This condition may be more likely to develop in people who smoke and people who are overweight. Eustachian tube dysfunction may also be more likely to develop in children, especially children who have:  Certain birth defects of the mouth, such as cleft palate.  Large tonsils and adenoids.  What are the signs or symptoms? Symptoms of this condition may include:  A feeling of fullness in the ear.  Ear pain.  Clicking or popping noises in the ear.  Ringing in the ear.  Hearing loss.  Loss of balance.  Symptoms may get worse when the air pressure around you changes, such as when you travel to an area of high elevation or fly on an airplane. How is this diagnosed? This condition may be diagnosed based on:  Your symptoms.  A physical exam of your ear, nose,  and throat.  Tests, such as those that measure: ? The movement of your eardrum (tympanogram). ? Your hearing (audiometry).  How is this treated? Treatment depends on the cause and severity of your condition. If your symptoms are mild, you may be able to relieve your symptoms by moving air into ("popping") your ears. If you have symptoms of fluid in your ears, treatment may include:  Decongestants.  Antihistamines.  Nasal sprays or ear drops that contain medicines that reduce swelling (steroids).  In some cases, you may need to have a procedure to drain the fluid in your eardrum (myringotomy). In this procedure, a small tube is placed in the eardrum to:  Drain the fluid.  Restore the air in the middle ear space.  Follow these instructions at home:  Take over-the-counter and prescription medicines only as told by your health care provider.  Use techniques to help pop your ears as recommended by your health care provider. These may include: ? Chewing gum. ? Yawning. ? Frequent, forceful swallowing. ? Closing your mouth, holding your nose closed, and gently blowing as if you are trying to blow air out of your nose.  Do not do any of the following until your health care provider approves: ? Travel to high altitudes. ? Fly in airplanes. ? Work in a Estate agentpressurized cabin or room. ? Scuba dive.  Keep your ears dry. Dry your ears completely after showering or bathing.  Do not smoke.  Keep all follow-up visits  as told by your health care provider. This is important. Contact a health care provider if:  Your symptoms do not go away after treatment.  Your symptoms come back after treatment.  You are unable to pop your ears.  You have: ? A fever. ? Pain in your ear. ? Pain in your head or neck. ? Fluid draining from your ear.  Your hearing suddenly changes.  You become very dizzy.  You lose your balance. This information is not intended to replace advice given to you by  your health care provider. Make sure you discuss any questions you have with your health care provider. Document Released: 07/26/2015 Document Revised: 12/05/2015 Document Reviewed: 07/18/2014 Elsevier Interactive Patient Education  [employer].

## 2018-06-13 NOTE — Progress Notes (Signed)
Anne Hayden , 1971-04-20, 47 y.o., female MRN: [id number] Patient Care Team    Relationship Specialty Notifications Start End  Natalia Leatherwood, DO PCP - General Family Medicine  01/26/18   Cleotis Nipper, MD Consulting Physician Psychiatry  01/27/18     Chief Complaint  Patient presents with  . Edema    Lympth nodes in neck swollen, x 15mo     Subjective: Pt presents for an OV with complaints of swollen  lymph nodes of her neck for 4 weeks  duration.  Associated symptoms include intermittent pain in her ears. She denies fever, chills, nausea or other URI symptoms. She denies weight loss or night sweats. She reports a mild cough at times- but not often.  Pt has tried nothing to ease their symptoms.  Cbc/cmp 05/02/2018 wnl Mam completed at gyn.  Weight stable.  Depression screen PHQ 2/9 01/26/2018  Decreased Interest 0  Down, Depressed, Hopeless 2  PHQ - 2 Score 2  Altered sleeping 1  Tired, decreased energy 1  Change in appetite 0  Feeling bad or failure about yourself  2  Trouble concentrating 1  Moving slowly or fidgety/restless 0  Suicidal thoughts 0  PHQ-9 Score 7    No Known Allergies Social History   Tobacco Use  . Smoking status: Never Smoker  . Smokeless tobacco: Never Used  Substance Use Topics  . Alcohol use: Not Currently    Frequency: Never    Comment: rare   Past Medical History:  Diagnosis Date  . Allergy   . Depression with anxiety    est. with psychiatry, Dr. [name]. In the past she reports she has been told she was bipolar and OCD  . OCD (obsessive compulsive disorder)   . Osteoarthritis   . Vitamin D deficiency    Past Surgical History:  Procedure Laterality Date  . 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 COLPOSCOPY  2004,2005,2012,2018   Family History  Problem Relation Age of Onset  . Arthritis Mother   . Hypertension Mother   . Heart disease Father   . Stroke Father   . Polycythemia Father   . Breast cancer Sister 60   . Breast cancer Maternal Grandmother 22  . Melanoma Maternal Grandfather   . COPD Maternal Grandfather   . Diabetes Paternal Grandmother   . Heart disease Paternal Grandfather   . Bipolar disorder Paternal Uncle    Allergies as of 06/13/2018   No Known Allergies     Medication List        Accurate as of 06/13/18 11:24 AM. Always use your most recent med list.          ARIPiprazole 2 MG tablet Commonly known as:  ABILIFY Take 1 tablet (2 mg total) by mouth daily.   BALZIVA 0.4-35 MG-MCG tablet Generic drug:  norethindrone-ethinyl estradiol Take by mouth.   citalopram 40 MG tablet Commonly known as:  CELEXA Take 1 tablet (40 mg total) by mouth daily.   clindamycin 1 % lotion Commonly known as:  CLEOCIN T clindamycin 1 % lotion   clonazePAM 0.5 MG tablet Commonly known as:  KLONOPIN Use twice daily and may use 1 daily PRN (not more than 15 x a month)   ketoconazole 2 % shampoo Commonly known as:  NIZORAL ketoconazole 2 % shampoo   PREVIDENT 5000 SENSITIVE 1.1-5 % Pste Generic drug:  Sod Fluoride-Potassium Nitrate PreviDent 5000 Sensitive 1.1 %-5 % dental paste   tretinoin 0.1 %  cream Commonly known as:  RETIN-A tretinoin 0.1 % topical cream  APPLY A PEA SIZE AMOUNT TO FULL FACE AT BEDTIME   valACYclovir 500 MG tablet Commonly known as:  VALTREX 1 po BID prn       All past medical history, surgical history, allergies, family history, immunizations andmedications were updated in the EMR today and reviewed under the history and medication portions of their EMR.     ROS: Negative, with the exception of above mentioned in HPI   Objective:  BP 110/78 (BP Location: Left Arm, Patient Position: Sitting, Cuff Size: Normal)   Pulse 77   Temp 97.7 F (36.5 C) (Oral)   Resp 16   Ht 5\' 1"  (1.549 m)   Wt 92 lb (41.7 kg)   SpO2 98%   BMI 17.38 kg/m  Body mass index is 17.38 kg/m. Gen: Afebrile. No acute distress. Nontoxic in appearance, well developed, well  nourished.  HENT: AT. . Bilateral TM visualized with fullness and fluid present, no erythema. MMM, no oral lesions. Bilateral nares with mild swelling, no drainage or erythema. Throat without erythema or exudates. No cough or hoarseness.  Eyes:Pupils Equal Round Reactive to light, Extraocular movements intact,  Conjunctiva without redness, discharge or icterus. Neck/lymp/endocrine: Supple,bilateral anterior cervical lymphadenopathy with tenderness.  Skin: no rashes, purpura or petechiae.  Neuro:  Normal gait. PERLA. EOMi. Alert. Oriented x3 No exam data present No results found. No results found for this or any previous visit (from the past 24 hour(s)).  Assessment/Plan: Penni HomansJennifer S Nold is a 47 y.o. female present for OV for  Eustachian tube dysfunction, bilateral - start flonase daily- prescribed.  - galbreath maneuver instructed for her to perform.  - f/u 4 weeks PRN   Reviewed expectations re: course of current medical issues.  Discussed self-management of symptoms.  Outlined signs and symptoms indicating need for more acute intervention.  Patient verbalized understanding and all questions were answered.  Patient received an After-Visit Summary.    No orders of the defined types were placed in this encounter.    Note is dictated utilizing voice recognition software. Although note has been proof read prior to signing, occasional typographical errors still can be missed. If any questions arise, please do not hesitate to call for verification.   electronically signed by:  Felix Pacinienee Charlena Haub, DO  [facility] [place]

## 2018-06-17 ENCOUNTER — Telehealth: Payer: Self-pay | Admitting: *Deleted

## 2018-06-17 NOTE — Telephone Encounter (Signed)
Aleve can be used for discomfort every 12 hours with food. Chewing gum or sucking on candies can also help during flights.

## 2018-06-17 NOTE — Telephone Encounter (Signed)
Patient has been informed of information. Stated verbal understanding. Patient states if she is no better after her trip she will make an appointment to have her ears checked again.

## 2018-06-17 NOTE — Telephone Encounter (Signed)
Copied from CRM [phone]. Topic: General - Other >> Jun 17, 2018  8:07 AM Percival SpanishKennedy, Cheryl W wrote:  Pt said she is flying on Sunday and is still having the ear pain and it it hurt to lay on it and she is asking if there is something she can try or if something can be called in .   Pharm[facility]

## 2018-06-27 ENCOUNTER — Ambulatory Visit (HOSPITAL_COMMUNITY): Payer: Self-pay | Admitting: Licensed Clinical Social Worker

## 2018-06-28 ENCOUNTER — Telehealth (HOSPITAL_COMMUNITY): Payer: Self-pay

## 2018-06-28 NOTE — Telephone Encounter (Signed)
If Abilify causing mania then she should stop the medication.  If she agreed that she can try low-dose Lamictal 25 mg which she has taken in the past but stopped due to nonspecific rash.  However if she developed a rash then she had to stop the Lamictal.

## 2018-06-28 NOTE — Telephone Encounter (Signed)
Patient is calling to report that she thinks that the Abilify is causing a small amount of mania - she is calling people at times when she should not - talking at inappropriate times - the feeling that something is going to happen ( feeling of dread). Please review and advise, thank you

## 2018-06-29 MED ORDER — LAMOTRIGINE 25 MG PO TABS: 25.0000 mg | ORAL_TABLET | Freq: Every day | ORAL | 2 refills | Status: DC

## 2018-06-30 ENCOUNTER — Other Ambulatory Visit (HOSPITAL_COMMUNITY): Payer: Self-pay | Admitting: Psychiatry

## 2018-06-30 DIAGNOSIS — F331 Major depressive disorder, recurrent, moderate: Secondary | ICD-10-CM

## 2018-06-30 DIAGNOSIS — F429 Obsessive-compulsive disorder, unspecified: Secondary | ICD-10-CM

## 2018-07-01 NOTE — Telephone Encounter (Signed)
Patient was called and she agreed to this plan, Lamictal prescription sent to the pharmacy

## 2018-07-14 ENCOUNTER — Ambulatory Visit (INDEPENDENT_AMBULATORY_CARE_PROVIDER_SITE_OTHER): Payer: 59 | Admitting: Licensed Clinical Social Worker

## 2018-07-14 DIAGNOSIS — F331 Major depressive disorder, recurrent, moderate: Secondary | ICD-10-CM | POA: Diagnosis not present

## 2018-07-14 DIAGNOSIS — F429 Obsessive-compulsive disorder, unspecified: Secondary | ICD-10-CM | POA: Diagnosis not present
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
## 2018-07-19 ENCOUNTER — Ambulatory Visit (INDEPENDENT_AMBULATORY_CARE_PROVIDER_SITE_OTHER): Payer: 59 | Admitting: Obstetrics & Gynecology

## 2018-07-19 ENCOUNTER — Encounter: Payer: Self-pay | Admitting: Obstetrics & Gynecology

## 2018-07-19 VITALS — BP 112/68 | Ht 61.0 in | Wt 96.0 lb

## 2018-07-19 DIAGNOSIS — Z8619 Personal history of other infectious and parasitic diseases: Secondary | ICD-10-CM | POA: Diagnosis not present

## 2018-07-19 DIAGNOSIS — Z3041 Encounter for surveillance of contraceptive pills: Secondary | ICD-10-CM | POA: Diagnosis not present

## 2018-07-19 DIAGNOSIS — Z01419 Encounter for gynecological examination (general) (routine) without abnormal findings: Secondary | ICD-10-CM

## 2018-07-19 MED ORDER — NORETHINDRONE-ETH ESTRADIOL 0.4-35 MG-MCG PO TABS: 1.0000 | ORAL_TABLET | Freq: Every day | ORAL | 4 refills | Status: DC

## 2018-07-19 MED ORDER — VALACYCLOVIR HCL 1 G PO TABS: 1000.0000 mg | ORAL_TABLET | Freq: Every day | ORAL | 3 refills | Status: DC

## 2018-07-19 NOTE — Progress Notes (Signed)
Anne Hayden [facility] 09-22-1970 [id number]   History:    48 y.o. G1P0A1 Married x 7 yrs.  Recently moved from [place].  RP:  New patient presenting for annual gyn exam   HPI: Well on Balziva, but mild BTB for the first time last month.  No pelvic pain.  Occasionally sexually active, no pain with IC.  Urine/BMs normal.  Breasts normal.  BMI 18.14, stable per patient.  Health labs with Fam MD.  Past medical history,surgical history, family history and social history were all reviewed and documented in the EPIC chart.  Gynecologic History Patient's last menstrual period was 07/12/2018. Contraception: OCP (estrogen/progesterone) Last Pap: 2018. Results were: normal per patient Last mammogram: 2019. Results were: normal per patient Bone Density: Never Colonoscopy: Never  Obstetric History OB History  Gravida Para Term Preterm AB Living  1 0     1 0  SAB TAB Ectopic Multiple Live Births               # Outcome Date GA Lbr Len/2nd Weight Sex Delivery Anes PTL Lv  1 AB              ROS: A ROS was performed and pertinent positives and negatives are included in the history.  GENERAL: No fevers or chills. HEENT: No change in vision, no earache, sore throat or sinus congestion. NECK: No pain or stiffness. CARDIOVASCULAR: No chest pain or pressure. No palpitations. PULMONARY: No shortness of breath, cough or wheeze. GASTROINTESTINAL: No abdominal pain, nausea, vomiting or diarrhea, melena or bright red blood per rectum. GENITOURINARY: No urinary frequency, urgency, hesitancy or dysuria. MUSCULOSKELETAL: No joint or muscle pain, no back pain, no recent trauma. DERMATOLOGIC: No rash, no itching, no lesions. ENDOCRINE: No polyuria, polydipsia, no heat or cold intolerance. No recent change in weight. HEMATOLOGICAL: No anemia or easy bruising or bleeding. NEUROLOGIC: No headache, seizures, numbness, tingling or weakness. PSYCHIATRIC: No depression, no loss of interest in normal activity or change  in sleep pattern.     Exam:   BP 112/68   Ht 5\' 1"  (1.549 m)   Wt 96 lb (43.5 kg)   LMP 07/12/2018 Comment: PILL  BMI 18.14 kg/m   Body mass index is 18.14 kg/m.  General appearance : Well developed well nourished female. No acute distress HEENT: Eyes: no retinal hemorrhage or exudates,  Neck supple, trachea midline, no carotid bruits, no thyroidmegaly Lungs: Clear to auscultation, no rhonchi or wheezes, or rib retractions  Heart: Regular rate and rhythm, no murmurs or gallops Breast:Examined in sitting and supine position were symmetrical in appearance, no palpable masses or tenderness,  no skin retraction, no nipple inversion, no nipple discharge, no skin discoloration, no axillary or supraclavicular lymphadenopathy Abdomen: no palpable masses or tenderness, no rebound or guarding Extremities: no edema or skin discoloration or tenderness  Pelvic: Vulva: Normal             Vagina: No gross lesions or discharge  Cervix: No gross lesions or discharge.  Pap reflex done.  Uterus  AV, normal size, shape and consistency, non-tender and mobile  Adnexa  Without masses or tenderness  Anus: Normal   Assessment/Plan:  48 y.o. female for annual exam   1. Encounter for routine gynecological examination with Papanicolaou smear of cervix Normal gynecologic exam.  Pap reflex done.  Breast exam normal.  Will schedule screening mammogram in 2020.  Health labs with family physician.  Body mass index at 18.14.  Needs to maintain her weight  is stable.  Healthy nutrition with sufficient calories recommended.  Aerobic physical activities 5 times a week and weightlifting every 2 days.  2. Encounter for surveillance of contraceptive pills Well on Balziva.  No contraindication to continue on birth control pills.  Same birth control pill represcribed.  3. H/O herpes genitalis Occasional recurrences of herpes genitalis.  Treatment for recurrences prescribed with acyclovir 1 g p.o. daily for 5 days as  needed.  Prescription sent to pharmacy.  Other orders - norethindrone-ethinyl estradiol (BALZIVA) 0.4-35 MG-MCG tablet; Take 1 tablet by mouth daily. - valACYclovir (VALTREX) 1000 MG tablet; Take 1 tablet (1,000 mg total) by mouth daily for 5 days.   Genia Del MD, 3:32 PM 07/19/2018

## 2018-07-20 LAB — PAP IG W/ RFLX HPV ASCU

## 2018-07-22 ENCOUNTER — Encounter: Payer: Self-pay | Admitting: *Deleted

## 2018-07-22 ENCOUNTER — Encounter: Payer: Self-pay | Admitting: Obstetrics & Gynecology

## 2018-07-22 NOTE — Patient Instructions (Signed)
1. Encounter for routine gynecological examination with Papanicolaou smear of cervix Normal gynecologic exam.  Pap reflex done.  Breast exam normal.  Will schedule screening mammogram in 2020.  Health labs with family physician.  Body mass index at 18.14.  Needs to maintain her weight is stable.  Healthy nutrition with sufficient calories recommended.  Aerobic physical activities 5 times a week and weightlifting every 2 days.  2. Encounter for surveillance of contraceptive pills Well on Balziva.  No contraindication to continue on birth control pills.  Same birth control pill represcribed.  3. H/O herpes genitalis Occasional recurrences of herpes genitalis.  Treatment for recurrences prescribed with acyclovir 1 g p.o. daily for 5 days as needed.  Prescription sent to pharmacy.  Other orders - norethindrone-ethinyl estradiol (BALZIVA) 0.4-35 MG-MCG tablet; Take 1 tablet by mouth daily. - valACYclovir (VALTREX) 1000 MG tablet; Take 1 tablet (1,000 mg total) by mouth daily for 5 days.  Yves, it was a pleasure meeting you today!  I will inform you of your results as soon as they are available.

## 2018-07-28 ENCOUNTER — Ambulatory Visit (INDEPENDENT_AMBULATORY_CARE_PROVIDER_SITE_OTHER): Payer: 59 | Admitting: Licensed Clinical Social Worker

## 2018-07-28 DIAGNOSIS — F411 Generalized anxiety disorder: Secondary | ICD-10-CM

## 2018-07-28 NOTE — Progress Notes (Signed)
   THERAPIST PROGRESS NOTE  Session Time: 12:05pm-1pm  Participation Level: Active  Behavioral Response: CasualAlertAnxious and Depressed  Type of Therapy: Individual Therapy  Treatment Goals addressed: Coping  Interventions: CBT, Motivational Interviewing and Supportive  Summary: Anne Hayden is a 48 y.o. female who presents with symptoms of depression and anxiety. Client reports ongoing obsessing thoughts about objects and jealousy relating to family. Client reports she does not feel a difference on her recently changed medications and is interested in stopping them. Client stated she as had lack of motivation. Client is open to adding mindful movements into her day, incorporating breathing skills to distract from distressing thoughts.   Suicidal/Homicidal: Yeswithout intent/plan. Client reports chronic suicidal thoughts with no plan or intent to follow through.  Therapist Response: Clinician met with client, assessing for SI/HI/psychosis and substance use. Clinician presented the skill of mindfulness and adding mindful movements and breathing skills into her day to disrupt the obsessive thoughts.  Plan: Return again in 2-3 weeks.  Diagnosis: Axis I: Generalized Anxiety Disorder, Major Depression, Recurrent severe and Obsessive Compulsive Disorder    Olegario Messier, LCSW 07/28/2018

## 2018-08-08 ENCOUNTER — Ambulatory Visit (INDEPENDENT_AMBULATORY_CARE_PROVIDER_SITE_OTHER): Payer: 59 | Admitting: Psychiatry

## 2018-08-08 ENCOUNTER — Encounter (HOSPITAL_COMMUNITY): Payer: Self-pay | Admitting: Psychiatry

## 2018-08-08 DIAGNOSIS — F331 Major depressive disorder, recurrent, moderate: Secondary | ICD-10-CM

## 2018-08-08 DIAGNOSIS — F429 Obsessive-compulsive disorder, unspecified: Secondary | ICD-10-CM | POA: Diagnosis not present

## 2018-08-08 DIAGNOSIS — F411 Generalized anxiety disorder: Secondary | ICD-10-CM

## 2018-08-08 MED ORDER — CLONAZEPAM 0.5 MG PO TABS: ORAL_TABLET | ORAL | 2 refills | Status: DC

## 2018-08-08 MED ORDER — ARIPIPRAZOLE 2 MG PO TABS: 2.0000 mg | ORAL_TABLET | Freq: Every day | ORAL | 2 refills | Status: DC

## 2018-08-08 MED ORDER — CITALOPRAM HYDROBROMIDE 40 MG PO TABS: 40.0000 mg | ORAL_TABLET | Freq: Every day | ORAL | 0 refills | Status: DC

## 2018-08-08 NOTE — Progress Notes (Signed)
BH MD/PA/NP OP Progress Note  08/08/2018 3:32 PM Penni HomansJennifer S Eberlin  MRN:  [id number]  Chief Complaint: I do not think Lamictal helping me.  I do not have any rash but I see no improvement.  My mother reported that I was doing better on Abilify.  HPI: [name] came for her follow-up appointment.  She is taking Lamictal after she called us that Abilify making her manic.  However now she recall that she was actually doing better and her mother also reported to her that Abilify was helping her mood.  She do not notice any improvement on Lamictal.  She recall even though she was taking 200 mg when she was in [place] she did not notice any difference.  We talked about maintenance ECT.  She had 5 ECT treatment but she is scared to continue but now she is thinking to have maintenance ECT because she noticed improvement.  She had some concerns about her finances at she got a bill from anesthesiology after ECT and she was not happy about it.  She is working with her insurance and if issues resolve she may consider going to maintenance ECT.  She denies any mania, aggression, suicidal thoughts.  However she admitted irritability and frustration.  She still have residual obsession and feeling of hopelessness sometimes.  She is seeing therapist in this office.  She denies any mania or any hallucination.  Her energy level is fair.  She had a good Christmas.  She visited her sister who lives in [place].  Her husband is coming on February 8.  Patient denies drinking or using any illegal substances.  Visit Diagnosis:    ICD-10-CM   1. Generalized anxiety disorder F41.1 clonazePAM (KLONOPIN) 0.5 MG tablet  2. Obsessive-compulsive disorder, unspecified type F42.9 clonazePAM (KLONOPIN) 0.5 MG tablet    citalopram (CELEXA) 40 MG tablet    ARIPiprazole (ABILIFY) 2 MG tablet  3. MDD (major depressive disorder), recurrent episode, moderate (HCC) F33.1 citalopram (CELEXA) 40 MG tablet    ARIPiprazole (ABILIFY) 2 MG tablet    Past  Psychiatric History: Reviewed. History of anxiety and depression. Ad[facility] due to severe depression and suicidal thoughts. No history ofsuicidal attempt. Seroquel cause weight gain, Risperdal caused nausea, Trintellix cause itching, Geodon, Neurontin and Paxil did not help. Tried Lamictal when she was in [place] but could not afford when she returned to [place]. History of obsessive thoughts but no paranoia or psychosis. Recall history of mania and irritability.   Past Medical History:  Past Medical History:  Diagnosis Date  . Allergy   . Depression with anxiety    est. with psychiatry, Dr. Lolly MustacheArfeen. In the past she reports she has been told she was bipolar and OCD  . OCD (obsessive compulsive disorder)   . Osteoarthritis   . Vitamin D deficiency     Past Surgical History:  Procedure Laterality Date  . 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 COLPOSCOPY  2004,2005,2012,2018  . TOE SURGERY Right     Family Psychiatric History: Reviewed.  Family History:  Family History  Problem Relation Age of Onset  . Arthritis Mother   . Hypertension Mother   . Heart disease Father   . Stroke Father   . Polycythemia Father   . Breast cancer Sister [id number]  . Breast cancer Maternal Grandmother [id number]  . Melanoma Maternal Grandfather   . COPD Maternal Grandfather   . Cancer Maternal Grandfather  melanoma  . Diabetes Paternal Grandmother   . Heart disease Paternal Grandfather   . Bipolar disorder Paternal Uncle     Social History:  Social History   Socioeconomic History  . Marital status: Married    Spouse name: markus  . Number of children: 0  . Years of education: Not on file  . Highest education level: Master's degree (e.g., MA, MS, MEng, MEd, MSW, MBA)  Occupational History  . Not on file  Social Needs  . Financial resource strain: Not hard at all  . Food insecurity:    Worry: Never true    Inability: Never true  . Transportation needs:     Medical: No    Non-medical: No  Tobacco Use  . Smoking status: Never Smoker  . Smokeless tobacco: Never Used  Substance and Sexual Activity  . Alcohol use: Not Currently    Frequency: Never    Comment: rare  . Drug use: Never  . Sexual activity: Yes    Partners: Male    Comment: 1st intercourse- [age], partners- less than 20, married- 6255yrs   Lifestyle  . Physical activity:    Days per week: 7 days    Minutes per session: 10 min  . Stress: Very much  Relationships  . Social connections:    Talks on phone: More than three times a week    Gets together: Once a week    Attends religious service: Never    Active member of club or organization: No    Attends meetings of clubs or organizations: Never    Relationship status: Married  Other Topics Concern  . Not on file  Social History Narrative   Married. Husband lives in [place] through the week. She recently moved to [place] to be closer to her family.    Masters of Arts degree. Unemployed.     Allergies: No Known Allergies  Metabolic Disorder Labs: No results found for: HGBA1C, MPG No results found for: PROLACTIN No results found for: CHOL, TRIG, HDL, CHOLHDL, VLDL, LDLCALC No results found for: TSH  Therapeutic Level Labs: No results found for: LITHIUM No results found for: VALPROATE No components found for:  CBMZ  Current Medications: Current Outpatient Medications  Medication Sig Dispense Refill  . citalopram (CELEXA) 40 MG tablet Take 1 tablet (40 mg total) by mouth daily. 90 tablet 0  . clonazePAM (KLONOPIN) 0.5 MG tablet Use twice daily and may use 1 daily PRN (not more than 15 x a month) 75 tablet 1  . ketoconazole (NIZORAL) 2 % shampoo ketoconazole 2 % shampoo    . lamoTRIgine (LAMICTAL) 25 MG tablet Take 1 tablet (25 mg total) by mouth daily. 30 tablet 2  . norethindrone-ethinyl estradiol (BALZIVA) 0.4-35 MG-MCG tablet Take 1 tablet by mouth daily. 3 Package 4  . Sod Fluoride-Potassium Nitrate (PREVIDENT 5000 SENSITIVE)  1.1-5 % PSTE PreviDent 5000 Sensitive 1.1 %-5 % dental paste    . VITAMIN D PO Take by mouth.    . ARIPiprazole (ABILIFY) 2 MG tablet Take 1 tablet (2 mg total) by mouth daily. (Patient not taking: Reported on 07/19/2018) 30 tablet 1  . clindamycin (CLEOCIN T) 1 % lotion clindamycin 1 % lotion    . fluticasone (FLONASE) 50 MCG/ACT nasal spray Place 2 sprays into both nostrils daily. (Patient not taking: Reported on 08/08/2018) 16 g 6  . tretinoin (RETIN-A) 0.1 % cream tretinoin 0.1 % topical cream  APPLY A PEA SIZE AMOUNT TO FULL FACE AT BEDTIME  No current facility-administered medications for this visit.      Musculoskeletal: Strength & Muscle Tone: within normal limits Gait & Station: normal Patient leans: N/A  Psychiatric Specialty Exam: ROS  Blood pressure 108/70, pulse 70, height 5\' 1"  (1.549 m), weight 95 lb 9.6 oz (43.4 kg), last menstrual period 07/12/2018, SpO2 100 %.Body mass index is 18.06 kg/m.  General Appearance: Neat  Eye Contact:  Good  Speech:  Normal Rate  Volume:  Normal  Mood:  Anxious  Affect:  Congruent  Thought Process:  Descriptions of Associations: Intact  Orientation:  Full (Time, Place, and Person)  Thought Content: Obsessions and Rumination   Suicidal Thoughts:  No  Homicidal Thoughts:  No  Memory:  Immediate;   Fair Recent;   Fair Remote;   Good  Judgement:  Good  Insight:  Good  Psychomotor Activity:  Decreased  Concentration:  Concentration: Fair and Attention Span: Fair  Recall:  [employer] of Knowledge: Good  Language: Good  Akathisia:  No  Handed:  Right  AIMS (if indicated): not done  Assets:  Communication Skills Desire for Improvement Housing Social Support  ADL's:  Intact  Cognition: WNL  Sleep:  Good   Screenings: ECT-MADRS     ECT Treatment from [facility]/16/2018 in [facility] ECT Treatment from 05/09/2018 in [facility]  MADRS Total Score  22  35     Mini-Mental     ECT Treatment from 05/16/2018 in [facility] ECT Treatment from 05/09/2018 in [facility]  Total Score (max 30 points )  30  30    PHQ2-9     Office Visit from 01/26/2018 in [facility] Total Score  2  PHQ-9 Total Score  7       Assessment and Plan: Major depressive disorder, recurrent.  Does have compulsive disorder.  Generalized anxiety disorder.  I will discontinue Lamictal as patient does not see any improvement with the Lamictal.  She recall even 200 mg Lamictal did not help her.  She like to go back low-dose Abilify which now she realized helping her more.  She takes Klonopin 0.5 mg twice a day and really third as needed.  She still have it but like to have the prescription in case she needed in the future.  I will continue Celexa 40 mg daily.  Restart Abilify 2 mg and recommended to call us back if she has any question or any concern.  Discussed medication side effects and benefits.  Encouraged to continue therapy with the therapist.  Follow-up in 3 months.   Cleotis Nipper, MD 08/08/2018, 3:32 PM

## 2018-08-12 ENCOUNTER — Other Ambulatory Visit: Payer: Self-pay | Admitting: Obstetrics & Gynecology

## 2018-08-12 DIAGNOSIS — Z1231 Encounter for screening mammogram for malignant neoplasm of breast: Secondary | ICD-10-CM

## 2018-08-18 ENCOUNTER — Ambulatory Visit (HOSPITAL_COMMUNITY): Payer: 59 | Admitting: Licensed Clinical Social Worker

## 2018-09-08 ENCOUNTER — Ambulatory Visit (INDEPENDENT_AMBULATORY_CARE_PROVIDER_SITE_OTHER): Payer: 59 | Admitting: Licensed Clinical Social Worker

## 2018-09-08 DIAGNOSIS — F411 Generalized anxiety disorder: Secondary | ICD-10-CM

## 2018-09-08 NOTE — Progress Notes (Signed)
   THERAPIST PROGRESS NOTE  Session Time: 3pm-3:50pm  Participation Level: Active  Behavioral Response: NeatAlertAnxious  Type of Therapy: Individual Therapy  Treatment Goals addressed: Anxiety and Coping  Interventions: CBT and Supportive  Summary: Anne Hayden is a 48 y.o. female who presents in person with depression and anxiety. Client reports positive experience when visiting her husband. Client reports she finds it distressing when having suicidal thoughts. Client identified 90s rap as helpful for making her smile and will use this to combat distressing thoughts. Client shared worry about the effect of coronavirus on the stock market and its spreads effect on her travel plans in the future. Client shows progress toward goals as evidence by identifying ruminating thoughts and creating plans to address thoughts. Client continues to struggle with some obsessive thinking.  Suicidal/Homicidal: none current, identifies recent SI usually at night, passive but distressingwithout intent/plan  Therapist Response: Clinician checked in with client, assessing for SI/HI/psychosis and overall level of functioning. Clinician inquired about recent stressors and attempts at coping skills. Clinician validated client concerns related to world medical concerns and possible effect on economy and future travel plans. Clinician utilize reflective statement and summarizing to engage client. Clinician utilized socratic questioning to explore thoughts related to anxiety. Clinician praised client for engaging in volunteering. Clinician and client discussed ways to address suicidal thoughts at night, including listening to a type of music that makes her laugh, using headphones to avoid waking the dogs.   Plan: Return again in 3 weeks.   Diagnosis: Axis I: Generalized Anxiety Disorder    Harlon Ditty, LCSW 09/08/2018

## 2018-09-13 ENCOUNTER — Ambulatory Visit (INDEPENDENT_AMBULATORY_CARE_PROVIDER_SITE_OTHER): Payer: 59 | Admitting: Family Medicine

## 2018-09-13 ENCOUNTER — Encounter: Payer: Self-pay | Admitting: Family Medicine

## 2018-09-13 VITALS — BP 109/74 | HR 88 | Temp 97.7°F | Resp 16 | Ht 61.0 in | Wt 98.1 lb

## 2018-09-13 DIAGNOSIS — L709 Acne, unspecified: Secondary | ICD-10-CM | POA: Diagnosis not present

## 2018-09-13 DIAGNOSIS — H938X1 Other specified disorders of right ear: Secondary | ICD-10-CM

## 2018-09-13 MED ORDER — TRETINOIN 0.1 % EX CREA: TOPICAL_CREAM | CUTANEOUS | 5 refills | Status: DC

## 2018-09-13 NOTE — Patient Instructions (Signed)
Use a very small amount of hydrocortisone cream nightly. Apply with q-tip just to the outer ear opening.  This is dry skin, like a psoriasis or eczema of the ears and is common.   No signs of infection  I have refilled your retinA also.

## 2018-09-13 NOTE — Progress Notes (Signed)
Anne Hayden , [birth year], 48 y.o., female MRN: [id number] Patient Care Team    Relationship Specialty Notifications Start End  Natalia Leatherwood, DO PCP - General Family Medicine  01/26/18   Cleotis Nipper, MD Consulting Physician Psychiatry  01/27/18     Chief Complaint  Patient presents with  . Ear Problem    Bilateral ear scabs x2 weeks      Subjective:  Anne Hayden is a 48 y.o. with ear irritation and acne. She would like a refill on her retin-a. She has not been able to get in to see her dermatologist. She has been using a small amount before bed for many years. She is worried about dry flaky ear canals today. She has used ketoconazole on her forehead for flakiness and it worked well. Her right ear is still flaky. She denies pain. She has had eustachian tube dysfunction of bilateral ears in the past.    Depression screen [facility] 2/9 01/26/2018  Decreased Interest 0  Down, Depressed, Hopeless 2  PHQ - 2 Score 2  Altered sleeping 1  Tired, decreased energy 1  Change in appetite 0  Feeling bad or failure about yourself  2  Trouble concentrating 1  Moving slowly or fidgety/restless 0  Suicidal thoughts 0  PHQ-9 Score 7    No Known Allergies Social History   Tobacco Use  . Smoking status: Never Smoker  . Smokeless tobacco: Never Used  Substance Use Topics  . Alcohol use: Not Currently    Frequency: Never    Comment: rare   Past Medical History:  Diagnosis Date  . Allergy   . Depression with anxiety    est. with psychiatry, Dr. [name]. In the past she reports she has been told she was bipolar and OCD  . OCD (obsessive compulsive disorder)   . Osteoarthritis   . Vitamin D deficiency    Past Surgical History:  Procedure Laterality Date  . 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 COLPOSCOPY  2004,2005,2012,2018  . TOE SURGERY Right    Family History  Problem Relation Age of Onset  . Arthritis Mother   . Hypertension Mother   . Heart disease  Father   . Stroke Father   . Polycythemia Father   . Breast cancer Sister 3  . Breast cancer Maternal Grandmother 29  . Melanoma Maternal Grandfather   . COPD Maternal Grandfather   . Cancer Maternal Grandfather        melanoma  . Diabetes Paternal Grandmother   . Heart disease Paternal Grandfather   . Bipolar disorder Paternal Uncle    Allergies as of 09/13/2018   No Known Allergies     Medication List       Accurate as of September 13, 2018  2:54 PM. Always use your most recent med list.        ARIPiprazole 2 MG tablet Commonly known as:  ABILIFY Take 1 tablet (2 mg total) by mouth daily.   citalopram 40 MG tablet Commonly known as:  CELEXA Take 1 tablet (40 mg total) by mouth daily.   clindamycin 1 % lotion Commonly known as:  CLEOCIN T clindamycin 1 % lotion   clonazePAM 0.5 MG tablet Commonly known as:  KLONOPIN Use twice daily and may use 1 daily PRN (not more than 15 x a month)   fluticasone 50 MCG/ACT nasal spray Commonly known as:  FLONASE Hayden 2 sprays into both nostrils daily.  ketoconazole 2 % shampoo Commonly known as:  NIZORAL ketoconazole 2 % shampoo   norethindrone-ethinyl estradiol 0.4-35 MG-MCG tablet Commonly known as:  BALZIVA Take 1 tablet by mouth daily.   PREVIDENT 5000 SENSITIVE 1.1-5 % Pste Generic drug:  Sod Fluoride-Potassium Nitrate PreviDent 5000 Sensitive 1.1 %-5 % dental paste   tretinoin 0.1 % cream Commonly known as:  RETIN-A tretinoin 0.1 % topical cream  APPLY A PEA SIZE AMOUNT TO FULL FACE AT BEDTIME   VITAMIN D PO Take by mouth.       All past medical history, surgical history, allergies, family history, immunizations andmedications were updated in the EMR today and reviewed under the history and medication portions of their EMR.     ROS: Negative, with the exception of above mentioned in HPI   Objective:  BP 109/74 (BP Location: Right Arm, Patient Position: Sitting, Cuff Size: Normal)   Pulse 88   Temp 97.7  F (36.5 C) (Oral)   Resp 16   Ht 5\' 1"  (1.549 m)   Wt 98 lb 2 oz (44.5 kg)   LMP 09/06/2018   SpO2 98%   BMI 18.54 kg/m  Body mass index is 18.54 kg/m. Gen: Afebrile. No acute distress.  HENT: AT. [place]. Bilateral TM visualized and normal in appearance. Mild dry flaky distal right ear canal. Eyes:Pupils Equal Round Reactive to light, Extraocular movements intact,  Conjunctiva without redness, discharge or icterus. Skin: no rashes, purpura or petechiae.   No exam data present No results found. No results found for this or any previous visit (from the past 24 hour(s)).  Assessment/Plan: Anne Hayden is a 48 y.o. female present for OV for  Irritation of ear, right Small amount of dry flaky skin right ear canal distally.  - instructed her to apply a very small amount of hydrocortisone cream to area nightly until resolved.  - reassured there is no signs of infection.  Acne, unspecified acne type - refill retin-A for her today.     Reviewed expectations re: course of current medical issues.  Discussed self-management of symptoms.  Outlined signs and symptoms indicating need for more acute intervention.  Patient verbalized understanding and all questions were answered.  Patient received an After-Visit Summary.    No orders of the defined types were placed in this encounter.    Note is dictated utilizing voice recognition software. Although note has been proof read prior to signing, occasional typographical errors still can be missed. If any questions arise, please do not hesitate to call for verification.   electronically signed by:  Felix Pacini, DO  [facility] [place]

## 2018-09-29 ENCOUNTER — Ambulatory Visit (HOSPITAL_COMMUNITY): Payer: 59 | Admitting: Licensed Clinical Social Worker

## 2018-10-11 ENCOUNTER — Ambulatory Visit: Payer: Self-pay | Admitting: Obstetrics & Gynecology

## 2018-11-07 ENCOUNTER — Ambulatory Visit (INDEPENDENT_AMBULATORY_CARE_PROVIDER_SITE_OTHER): Payer: 59 | Admitting: Psychiatry

## 2018-11-07 ENCOUNTER — Encounter (HOSPITAL_COMMUNITY): Payer: Self-pay | Admitting: Psychiatry

## 2018-11-07 ENCOUNTER — Other Ambulatory Visit: Payer: Self-pay

## 2018-11-07 DIAGNOSIS — F411 Generalized anxiety disorder: Secondary | ICD-10-CM | POA: Diagnosis not present

## 2018-11-07 DIAGNOSIS — F429 Obsessive-compulsive disorder, unspecified: Secondary | ICD-10-CM

## 2018-11-07 DIAGNOSIS — F331 Major depressive disorder, recurrent, moderate: Secondary | ICD-10-CM | POA: Diagnosis not present

## 2018-11-07 MED ORDER — CLONAZEPAM 0.5 MG PO TABS: ORAL_TABLET | ORAL | 2 refills | Status: DC

## 2018-11-07 MED ORDER — CITALOPRAM HYDROBROMIDE 40 MG PO TABS: 40.0000 mg | ORAL_TABLET | Freq: Every day | ORAL | 0 refills | Status: DC

## 2018-11-07 MED ORDER — ARIPIPRAZOLE 5 MG PO TABS: 5.0000 mg | ORAL_TABLET | Freq: Every day | ORAL | 1 refills | Status: DC

## 2018-11-07 NOTE — Progress Notes (Signed)
Virtual Visit via Telephone Note  I connected with Anne Hayden on 11/07/18 at  2:40 PM EDT by telephone and verified that I am speaking with the correct person using two identifiers.   I discussed the limitations, risks, security and privacy concerns of performing an evaluation and management service by telephone and the availability of in person appointments. I also discussed with the patient that there may be a patient responsible charge related to this service. The patient expressed understanding and agreed to proceed.   History of Present Illness: Patient was evaluated through phone conversation.  On her last visit we started Abilify and stop the Lamictal.  She did not see much improvement but she is tolerating well.  She still have OCD and sometimes she worried that current pandemic may cause her homeless.  Her husband is currently not working and staying with her.  She admitted sometimes crying spells and increased anxiety.  She feels the Klonopin working along with Celexa.  We have discussed to resume maintenance ECT but at that time she has difficulty transportation but now she agrees since her husband is staying with her.  She is sleeping okay.  She denies any mania or psychosis.  She denies any suicidal thoughts.     Past Psychiatric History: Reviewed. History ofanxiety and depression. Admitted [facility] in 2010 due to severe depression and suicidal thoughts. No history ofsuicidal attempt. Seroquel cause weight gain, Risperdal caused nausea, Trintellix cause itching, Geodon, Neurontin and Paxil did not help. TriedLamictal when she was in [place] but could not afford when she returned to [place]. History of obsessive thoughts butnoparanoia or psychosis. Recall history of mania and irritability.    Observations/Objective: Mental status examination done on the phone.  Patient described her mood anxious.  Her speech is slow, soft with decreased volume and tone.  She endorsed  obsession about being homeless but denies any paranoia, suicidal thoughts or homicidal thought.  Her attention and concentration is fair.  She denies any auditory or visual hallucination.  She denies any grandiosity or delusions.  She is alert and oriented x3.  Her insight judgment is okay.  Her cognition is intact.  Her fund of knowledge is good.  Assessment and Plan: Major depressive disorder, recurrent.  Obsessive-compulsive disorder.  Generalized anxiety disorder.  I recommend to try Abilify 5 mg since she is tolerating well 2 mg.  We discussed again ECT maintenance and she is willing to explore the option.  She had ECT in the past with good response.  Recommend to try Abilify 5 mg but if you do not see any improvement then she should call us so we can refer her to Dr. Toni Amend for ECT.  Discussed medication side effects and benefits.  Recommended to call us back if she has any question or any concern.  Continue Klonopin 0.5 mg twice a day and third as needed and Celexa 40 mg daily.  Follow-up in 2 months.  Follow Up Instructions:    I discussed the assessment and treatment plan with the patient. The patient was provided an opportunity to ask questions and all were answered. The patient agreed with the plan and demonstrated an understanding of the instructions.   The patient was advised to call back or seek an in-person evaluation if the symptoms worsen or if the condition fails to improve as anticipated.  I provided 20 minutes of non-face-to-face time during this encounter.   Cleotis Nipper, MD

## 2018-11-09 ENCOUNTER — Ambulatory Visit: Payer: Self-pay

## 2018-11-30 ENCOUNTER — Other Ambulatory Visit (HOSPITAL_COMMUNITY): Payer: Self-pay | Admitting: Psychiatry

## 2018-11-30 DIAGNOSIS — F429 Obsessive-compulsive disorder, unspecified: Secondary | ICD-10-CM

## 2018-11-30 DIAGNOSIS — F331 Major depressive disorder, recurrent, moderate: Secondary | ICD-10-CM

## 2018-12-12 DIAGNOSIS — M2021 Hallux rigidus, right foot: Secondary | ICD-10-CM | POA: Insufficient documentation

## 2018-12-26 ENCOUNTER — Other Ambulatory Visit: Payer: Self-pay

## 2018-12-26 ENCOUNTER — Ambulatory Visit
Admission: RE | Admit: 2018-12-26 | Discharge: 2018-12-26 | Disposition: A | Discharge status: Home / Self Care | Payer: 59 | Source: Ambulatory Visit | Attending: Obstetrics & Gynecology | Admitting: Obstetrics & Gynecology

## 2018-12-26 DIAGNOSIS — Z1231 Encounter for screening mammogram for malignant neoplasm of breast: Secondary | ICD-10-CM

## 2019-01-05 ENCOUNTER — Encounter (HOSPITAL_COMMUNITY): Payer: Self-pay | Admitting: Psychiatry

## 2019-01-05 ENCOUNTER — Ambulatory Visit (INDEPENDENT_AMBULATORY_CARE_PROVIDER_SITE_OTHER): Payer: 59 | Admitting: Psychiatry

## 2019-01-05 DIAGNOSIS — F411 Generalized anxiety disorder: Secondary | ICD-10-CM

## 2019-01-05 DIAGNOSIS — F331 Major depressive disorder, recurrent, moderate: Secondary | ICD-10-CM

## 2019-01-05 DIAGNOSIS — F429 Obsessive-compulsive disorder, unspecified: Secondary | ICD-10-CM

## 2019-01-05 MED ORDER — CITALOPRAM HYDROBROMIDE 40 MG PO TABS: 40.0000 mg | ORAL_TABLET | Freq: Every day | ORAL | 0 refills | Status: DC

## 2019-01-05 MED ORDER — BUPROPION HCL ER (XL) 150 MG PO TB24: 150.0000 mg | ORAL_TABLET | Freq: Every day | ORAL | 1 refills | Status: DC

## 2019-01-05 MED ORDER — CLONAZEPAM 0.5 MG PO TABS: ORAL_TABLET | ORAL | 2 refills | Status: DC

## 2019-01-05 NOTE — Progress Notes (Signed)
Virtual Visit via Telephone Note  I connected with Anne Hayden on 01/05/19 at  2:00 PM EDT by telephone and verified that I am speaking with the correct person using two identifiers.   I discussed the limitations, risks, security and privacy concerns of performing an evaluation and management service by telephone and the availability of in person appointments. I also discussed with the patient that there may be a patient responsible charge related to this service. The patient expressed understanding and agreed to proceed.   History of Present Illness: Patient was evaluated by phone session.  On her last visit we increase Abilify to 5 mg but she did not see any improvement.  She continued to have chronic symptoms of depression with fatigue, lack of energy and lack of motivation.  Recently she is having a lot of complain of pain on her foot which she had surgery last year.  Sometimes she has difficulty walking.  She does not leave the house unless it is important.  She is pleased that her husband is working part-time from home.  She had stopped taking the Abilify due to lack of response.  She is sleeping good.  She feels her depression is same as sometimes she does not feel any emotions.  She has chronic obsessive thoughts and she feels that she will get homeless 1 day.  She also worried about COVID-19.  She feels numb but denies any crying spells or any feeling of hopelessness or worthlessness.  She denies any hallucination, paranoia, agitation or any anger.  We have discussed resuming ECT but due to recent pain in her foot she is not sure when to start.  She also endorsed financial issues but now she had a surgery in her foot she may have met the deductible.  He does not want to continue therapy because she feel it did not work for her.  She is taking Klonopin and Celexa.  She has no tremors shakes or any EPS.   Past Psychiatric History:Reviewed. History ofanxiety and depression.Admitted [facility] in 2010 due to severe depression and suicidal thoughts. No history ofsuicidal attempt. Seroquel cause weightgain, Risperdal caused nausea, Trintellix cause itching, Geodon, Neurontin and Paxil did not help. TriedLamictal when she was in [place] but could not afford when she returned to [place]. ECT helped. History of obsessive thoughts butnoparanoia or psychosis. Recall history of mania and irritability.  Psychiatric Specialty Exam: Physical Exam  ROS  There were no vitals taken for this visit.There is no height or weight on file to calculate BMI.  General Appearance: NA  Eye Contact:  NA  Speech:  Clear and Coherent and Slow  Volume:  Decreased  Mood:  Dysphoric  Affect:  NA  Thought Process:  Goal Directed  Orientation:  Full (Time, Place, and Person)  Thought Content:  Obsessions and Rumination  Suicidal Thoughts:  No  Homicidal Thoughts:  No  Memory:  Immediate;   Good Recent;   Good Remote;   Fair  Judgement:  Fair  Insight:  Good  Psychomotor Activity:  NA  Concentration:  Concentration: Fair and Attention Span: Fair  Recall:  Good  Fund of Knowledge:  Good  Language:  Good  Akathisia:  No  Handed:  Right  AIMS (if indicated):     Assets:  Communication Skills Desire for Improvement Housing Resilience Social Support  ADL's:  Intact  Cognition:  WNL  Sleep:   fair      Assessment and Plan: Major depressive disorder,  recurrent.  Obsessive-compulsive disorder.  Generalized anxiety disorder.  I will discontinue Abilify since it did not help her.  I recommend to resume ECT when she is ready.  She promised to call us back when she feels ready for ECT.  I recommend to try low-dose Wellbutrin with Celexa to help her residual symptoms.  She had never tried before.  She agreed with the plan.  We will start Wellbutrin XL 150 mg daily, continue Celexa 40 mg daily and Klonopin 0.5 mg twice a day and third as needed for severe anxiety.  Recommended to call  us back if is any question or any concern.  Follow-up in 2 months.  Follow Up Instructions:    I discussed the assessment and treatment plan with the patient. The patient was provided an opportunity to ask questions and all were answered. The patient agreed with the plan and demonstrated an understanding of the instructions.   The patient was advised to call back or seek an in-person evaluation if the symptoms worsen or if the condition fails to improve as anticipated.  I provided 15 minutes of non-face-to-face time during this encounter.   Kathlee Nations, MD

## 2019-01-27 ENCOUNTER — Other Ambulatory Visit (HOSPITAL_COMMUNITY): Payer: Self-pay | Admitting: Psychiatry

## 2019-01-27 DIAGNOSIS — F331 Major depressive disorder, recurrent, moderate: Secondary | ICD-10-CM

## 2019-02-14 LAB — HM COLONOSCOPY

## 2019-03-06 ENCOUNTER — Other Ambulatory Visit (HOSPITAL_COMMUNITY): Payer: Self-pay | Admitting: Psychiatry

## 2019-03-06 DIAGNOSIS — F331 Major depressive disorder, recurrent, moderate: Secondary | ICD-10-CM

## 2019-03-08 ENCOUNTER — Other Ambulatory Visit (HOSPITAL_COMMUNITY): Payer: Self-pay

## 2019-03-08 DIAGNOSIS — F331 Major depressive disorder, recurrent, moderate: Secondary | ICD-10-CM

## 2019-03-08 MED ORDER — BUPROPION HCL ER (XL) 150 MG PO TB24: 150.0000 mg | ORAL_TABLET | Freq: Every day | ORAL | 0 refills | Status: DC

## 2019-04-06 ENCOUNTER — Ambulatory Visit (INDEPENDENT_AMBULATORY_CARE_PROVIDER_SITE_OTHER): Payer: 59 | Admitting: Psychiatry

## 2019-04-06 ENCOUNTER — Other Ambulatory Visit: Payer: Self-pay

## 2019-04-06 ENCOUNTER — Encounter (HOSPITAL_COMMUNITY): Payer: Self-pay | Admitting: Psychiatry

## 2019-04-06 DIAGNOSIS — F429 Obsessive-compulsive disorder, unspecified: Secondary | ICD-10-CM | POA: Diagnosis not present

## 2019-04-06 DIAGNOSIS — F331 Major depressive disorder, recurrent, moderate: Secondary | ICD-10-CM | POA: Diagnosis not present

## 2019-04-06 DIAGNOSIS — F411 Generalized anxiety disorder: Secondary | ICD-10-CM | POA: Diagnosis not present

## 2019-04-06 MED ORDER — CLONAZEPAM 0.5 MG PO TABS: ORAL_TABLET | ORAL | 2 refills | Status: DC

## 2019-04-06 MED ORDER — BUPROPION HCL ER (XL) 300 MG PO TB24: 300.0000 mg | ORAL_TABLET | Freq: Every day | ORAL | 0 refills | Status: DC

## 2019-04-06 MED ORDER — CITALOPRAM HYDROBROMIDE 40 MG PO TABS: 40.0000 mg | ORAL_TABLET | Freq: Every day | ORAL | 0 refills | Status: DC

## 2019-04-06 NOTE — Progress Notes (Signed)
Virtual Visit via Telephone Note  I connected with Anne Hayden on 04/06/19 at  2:40 PM EDT by telephone and verified that I am speaking with the correct person using two identifiers.   I discussed the limitations, risks, security and privacy concerns of performing an evaluation and management service by telephone and the availability of in person appointments. I also discussed with the patient that there may be a patient responsible charge related to this service. The patient expressed understanding and agreed to proceed.   History of Present Illness: Patient was evaluated by phone session.  On her last visit we started her on Wellbutrin since she do not see any improvement with Abilify.  She admitted some improvement with the Wellbutrin.  She has no longer suicidal thoughts and her energy level is improved.  She also feel her obsessive thoughts are not as intense but she continues to feel sad depressed with lack of energy and motivation to do things.  She does not want to do ECT at this time but like to give more time to the new medication.  She denies any agitation, anger or any severe mood swing.  She denies any side effects of the medication.  Sometimes she has a jaw clenching which is chronic and does not feel it is caused by the medication.  Her husband is working from home and she is pleased with that.  She does not leave the house because of COVID.  However she denies any major panic attack.  She is taking Klonopin, Celexa and Wellbutrin.  Her sleep is better.  She denies drinking or using any illegal substances.  Her appetite is okay.  Her weight is stable and unchanged from the past.   Past Psychiatric History:Reviewed. H/O anxiety, OCD and depression.Admitted [facility] in 2010 due to severe depression and suicidal thoughts. No h/o suicidal attempt. Seroquel cause weightgain, Risperdal caused nausea, Trintellix cause itching, Geodon, Neurontin, Paxil and Abilify did not  help. Took Lamictal in [place] but could not afford in [place]. ECT helped. Recall history of mania and irritability.  Psychiatric Specialty Exam: Physical Exam  ROS  There were no vitals taken for this visit.There is no height or weight on file to calculate BMI.  General Appearance: NA  Eye Contact:  NA  Speech:  Slow  Volume:  Decreased  Mood:  Dysphoric  Affect:  NA  Thought Process:  Goal Directed  Orientation:  Full (Time, Place, and Person)  Thought Content:  Obsessions and Rumination  Suicidal Thoughts:  No  Homicidal Thoughts:  No  Memory:  Immediate;   Good Recent;   Fair Remote;   Good  Judgement:  Fair  Insight:  Fair  Psychomotor Activity:  NA  Concentration:  Concentration: Fair and Attention Span: Fair  Recall:  Good  Fund of Knowledge:  Good  Language:  Good  Akathisia:  No  Handed:  Right  AIMS (if indicated):     Assets:  Communication Skills Desire for Improvement Housing Resilience Social Support  ADL's:  Intact  Cognition:  WNL  Sleep:   9-10    Assessment and Plan: Major depressive disorder, recurrent.  Obsessive-compulsive disorder.  Generalized anxiety disorder.  Patient shown some improvement with addition of Wellbutrin.  I recommend to try Wellbutrin XL 300 mg to target her residual symptoms of depression and anxiety.  She will continue Celexa 40 mg daily and clonazepam 0.5 mg twice a day and third as needed for severe anxiety.  Recommended to  call us back if she is any question or any concern.  Follow-up in 3 months.   Follow Up Instructions:    I discussed the assessment and treatment plan with the patient. The patient was provided an opportunity to ask questions and all were answered. The patient agreed with the plan and demonstrated an understanding of the instructions.   The patient was advised to call back or seek an in-person evaluation if the symptoms worsen or if the condition fails to improve as anticipated.  I provided 20 minutes of  non-face-to-face time during this encounter.   Kathlee Nations, MD

## 2019-05-20 ENCOUNTER — Other Ambulatory Visit (HOSPITAL_COMMUNITY): Payer: Self-pay | Admitting: Psychiatry

## 2019-05-20 DIAGNOSIS — F331 Major depressive disorder, recurrent, moderate: Secondary | ICD-10-CM

## 2019-06-06 ENCOUNTER — Encounter (HOSPITAL_COMMUNITY): Payer: Self-pay | Admitting: Psychiatry

## 2019-06-06 ENCOUNTER — Other Ambulatory Visit: Payer: Self-pay

## 2019-06-06 ENCOUNTER — Ambulatory Visit (INDEPENDENT_AMBULATORY_CARE_PROVIDER_SITE_OTHER): Payer: 59 | Admitting: Psychiatry

## 2019-06-06 DIAGNOSIS — F411 Generalized anxiety disorder: Secondary | ICD-10-CM

## 2019-06-06 DIAGNOSIS — F429 Obsessive-compulsive disorder, unspecified: Secondary | ICD-10-CM | POA: Diagnosis not present

## 2019-06-06 DIAGNOSIS — F331 Major depressive disorder, recurrent, moderate: Secondary | ICD-10-CM

## 2019-06-06 MED ORDER — CLONAZEPAM 0.5 MG PO TABS: ORAL_TABLET | ORAL | 2 refills | Status: DC

## 2019-06-06 MED ORDER — LAMOTRIGINE 25 MG PO TABS: ORAL_TABLET | ORAL | 2 refills | Status: DC

## 2019-06-06 MED ORDER — BUPROPION HCL ER (XL) 300 MG PO TB24: 300.0000 mg | ORAL_TABLET | Freq: Every day | ORAL | 0 refills | Status: DC

## 2019-06-06 NOTE — Progress Notes (Signed)
Virtual Visit via Telephone Note  I connected with Anne Hayden on 06/06/19 at  2:40 PM EST by telephone and verified that I am speaking with the correct person using two identifiers.   I discussed the limitations, risks, security and privacy concerns of performing an evaluation and management service by telephone and the availability of in person appointments. I also discussed with the patient that there may be a patient responsible charge related to this service. The patient expressed understanding and agreed to proceed.   History of Present Illness: Patient was evaluated by phone session.  On the last visit we increase Wellbutrin to 300 but patient noticed tremors.  She feel her obsessive thoughts are much better and she is less depressed and her sleep is slightly improved.  She does not sleep 11 hours.  However she is still struggle with chronic depression and passive and fleeting suicidal thoughts.  She is not happy because recently she saw the doctor for her foot and she was told she may need another surgery.  This will be her third surgery if it happens.  She denies any irritability, anger, mood swing.  We have recommended ECT however she refused but now she is open to the option.  She does not want to be admitted for ECT but okay if it is outpatient.  Her husband is working from home and she is pleased for that.  She takes mostly Klonopin twice a day and third only if she feels very nervous.  Her energy level is fair.  Her weight is unchanged from the past.  She recall Lamictal helped when she was in [place] but when she came back to you said she could not afford it.  She is open to restart Lamictal to see if her chronic depression and passive suicidal thoughts go away.   Past Psychiatric History:Reviewed. H/O anxiety, OCD and depression.Admitted [facility] in 2010 due to severe depression and suicidal thoughts. No h/o suicidal attempt. Seroquel cause weightgain, Risperdal caused  nausea, Trintellix cause itching, Geodon, Neurontin, Paxil and Abilify did not help. Took Lamictal in [place] but could not afford in [place]. ECT helped.Recall history of mania and irritability.   Psychiatric Specialty Exam: Physical Exam  ROS  There were no vitals taken for this visit.There is no height or weight on file to calculate BMI.  General Appearance: NA  Eye Contact:  NA  Speech:  Slow  Volume:  Decreased  Mood:  Depressed and Dysphoric  Affect:  NA  Thought Process:  Goal Directed  Orientation:  Full (Time, Place, and Person)  Thought Content:  Obsessions and Rumination  Suicidal Thoughts:  passive and fleeting thoughts but no plan  Homicidal Thoughts:  No  Memory:  Immediate;   Fair Recent;   Good Remote;   Good  Judgement:  Good  Insight:  Good  Psychomotor Activity:  NA  Concentration:  Concentration: Fair and Attention Span: Fair  Recall:  Good  Fund of Knowledge:  Good  Language:  Good  Akathisia:  tremors in hand  Handed:  Right  AIMS (if indicated):     Assets:  Communication Skills Desire for Improvement Housing Resilience Social Support  ADL's:  Intact  Cognition:  WNL  Sleep:   9 hrs      Assessment and Plan: Major depressive disorder, recurrent.  Obsessive-compulsive disorder.  Anxiety.  I will discontinue Celexa since patient feels combination of Celexa and Wellbutrin causing tremors.  She noticed improvement with Wellbutrin as she  has less obsessive thoughts and her energy level is improved.  We will try Lamictal 25 mg daily for 1 week and then 50 mg daily.  Continue Wellbutrin XL 300 mg daily and Klonopin 0.5 mg twice a day and third as needed for severe anxiety.  We will explore outpatient ECT if Lamictal does not work.  Patient is also anxious because she may require third foot surgery in January.  She is not interested in therapy.  Recommended to call us back if she has any question or any concern.  Follow-up in 6 weeks.  Follow Up  Instructions:    I discussed the assessment and treatment plan with the patient. The patient was provided an opportunity to ask questions and all were answered. The patient agreed with the plan and demonstrated an understanding of the instructions.   The patient was advised to call back or seek an in-person evaluation if the symptoms worsen or if the condition fails to improve as anticipated.  I provided 20 minutes of non-face-to-face time during this encounter.   Cleotis Nipper, MD

## 2019-06-14 ENCOUNTER — Other Ambulatory Visit: Payer: Self-pay

## 2019-06-14 ENCOUNTER — Encounter: Payer: Self-pay | Admitting: Family Medicine

## 2019-06-14 ENCOUNTER — Ambulatory Visit (INDEPENDENT_AMBULATORY_CARE_PROVIDER_SITE_OTHER): Payer: Self-pay | Admitting: Family Medicine

## 2019-06-14 VITALS — BP 99/70 | Temp 97.7°F

## 2019-06-14 DIAGNOSIS — M545 Low back pain, unspecified: Secondary | ICD-10-CM

## 2019-06-14 DIAGNOSIS — R112 Nausea with vomiting, unspecified: Secondary | ICD-10-CM

## 2019-06-14 DIAGNOSIS — Z87442 Personal history of urinary calculi: Secondary | ICD-10-CM

## 2019-06-14 NOTE — Progress Notes (Signed)
Virtual Visit via Video Note  I connected with pt on 06/14/19 at  4:00 PM EST by a video enabled telemedicine application and verified that I am speaking with the correct person using two identifiers.  Location patient: home Location provider:work or home office Persons participating in the virtual visit: patient, provider  I discussed the limitations of evaluation and management by telemedicine and the availability of in person appointments. The patient expressed understanding and agreed to proceed.  Telemedicine visit is a necessity given the COVID-19 restrictions in place at the current time.  HPI: 48 y/o WF being seen today accompanied by her husband for left low back pain and n/v. Onset this morning, was awoken from sleep with severe pain in left low back.  She felt nauseated and vomited yellow liquid.  The pain moved a bit into the side but not the groin.  Sounds like some generalized L abd pain but not focal and not intense.  The pain was not colicky. She couldn't find a comfortable position.  It lasted about 1.5-2 hrs and then resolved completely and she was able to sleep for 6+ hours afterwards.  Currently has no pain and has mild "upset" stomach but no pain in abd and no nausea. She has kept some food and drink down this afternoon. She has some nausea med and tramadol from a past procedure but has not taken these.  No dysuria, hematuria, urinary urgency or frequency, or diarrhea. No alcohol. She had not eaten in the last 6-7 hours prior to her pain. She gives hx of kidney stone, "present since age 31 or 5", last imaged with u/s about 10+ yrs ago per her report today, she doesn't recall what side it was on but recalls being told that if it ever caused problem then it likely was too big to pass on her own.  No recent LP strain, trauma, or repetitive motion.   ROS: See pertinent positives and negatives per HPI.  Past Medical History:  Diagnosis Date  . Allergy   . Depression with  anxiety    est. with psychiatry, Dr. Adele Schilder. In the past she reports she has been told she was bipolar and OCD  . OCD (obsessive compulsive disorder)   . Osteoarthritis   . Vitamin D deficiency     Past Surgical History:  Procedure Laterality Date  . 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 COLPOSCOPY  2004,2005,2012,2018  . TOE SURGERY Right     Family History  Problem Relation Age of Onset  . Arthritis Mother   . Hypertension Mother   . Heart disease Father   . Stroke Father   . Polycythemia Father   . Breast cancer Sister 24  . Breast cancer Maternal Grandmother 82  . Melanoma Maternal Grandfather   . COPD Maternal Grandfather   . Cancer Maternal Grandfather        melanoma  . Diabetes Paternal Grandmother   . Heart disease Paternal Grandfather   . Bipolar disorder Paternal Uncle     SOCIAL HX:  Social History   Socioeconomic History  . Marital status: Married    Spouse name: markus  . Number of children: 0  . Years of education: Not on file  . Highest education level: Master's degree (e.g., MA, MS, MEng, MEd, MSW, MBA)  Occupational History  . Not on file  Social Needs  . Financial resource strain: Not hard at all  . Food insecurity    Worry: Never true  Inability: Never true  . Transportation needs    Medical: No    Non-medical: No  Tobacco Use  . Smoking status: Never Smoker  . Smokeless tobacco: Never Used  Substance and Sexual Activity  . Alcohol use: Not Currently    Frequency: Never    Comment: rare  . Drug use: Never  . Sexual activity: Yes    Partners: Male    Comment: 1st intercourse- [id number], partners- less than 20, married- 5531yrs   Lifestyle  . Physical activity    Days per week: 7 days    Minutes per session: 10 min  . Stress: Very much  Relationships  . Social connections    Talks on phone: More than three times a week    Gets together: Once a week    Attends religious service: Never    Active member of club or  organization: No    Attends meetings of clubs or organizations: Never    Relationship status: Married  Other Topics Concern  . Not on file  Social History Narrative   Married. Husband lives in [place] through the week. She recently moved to [place] to be closer to her family.    Masters of Arts degree. Unemployed.       Current Outpatient Medications:  .  buPROPion (WELLBUTRIN XL) 300 MG 24 hr tablet, Take 1 tablet (300 mg total) by mouth daily., Disp: 90 tablet, Rfl: 0 .  clindamycin (CLEOCIN T) 1 % lotion, clindamycin 1 % lotion, Disp: , Rfl:  .  clonazePAM (KLONOPIN) 0.5 MG tablet, Use twice daily and may use 1 daily PRN (not more than 15 x a month), Disp: 75 tablet, Rfl: 2 .  ketoconazole (NIZORAL) 2 % shampoo, ketoconazole 2 % shampoo, Disp: , Rfl:  .  lamoTRIgine (LAMICTAL) 25 MG tablet, Take one tab daily for one week and than two tab daily, Disp: 60 tablet, Rfl: 2 .  norethindrone-ethinyl estradiol (BALZIVA) 0.4-35 MG-MCG tablet, Take 1 tablet by mouth daily., Disp: 3 Package, Rfl: 4 .  Sod Fluoride-Potassium Nitrate (PREVIDENT 5000 SENSITIVE) 1.1-5 % PSTE, PreviDent 5000 Sensitive 1.1 %-5 % dental paste, Disp: , Rfl:  .  tretinoin (RETIN-A) 0.1 % cream, tretinoin 0.1 % topical cream  APPLY A PEA SIZE AMOUNT TO FULL FACE AT BEDTIME, Disp: 45 g, Rfl: 5 .  VITAMIN D PO, Take by mouth., Disp: , Rfl:  .  citalopram (CELEXA) 40 MG tablet, Take 1 tablet (40 mg total) by mouth daily. (Patient not taking: Reported on 06/14/2019), Disp: 90 tablet, Rfl: 0 .  fluticasone (FLONASE) 50 MCG/ACT nasal spray, Place 2 sprays into both nostrils daily. (Patient not taking: Reported on 06/14/2019), Disp: 16 g, Rfl: 6  EXAM:  VITALS per patient if applicable: BP 99/70 (BP Location: Left Arm, Patient Position: Sitting, Cuff Size: Normal)   Temp 97.7 F (36.5 C) (Oral)    GENERAL: alert, oriented, appears well and in no acute distress. Smiling and moving completely normal.  HEENT: atraumatic, conjunttiva  clear, no obvious abnormalities on inspection of external nose and ears  NECK: normal movements of the head and neck  LUNGS: on inspection no signs of respiratory distress, breathing rate appears normal, no obvious gross SOB, gasping or wheezing  CV: no obvious cyanosis  MS: moves all visible extremities without noticeable abnormality  PSYCH/NEURO: pleasant and cooperative, no obvious depression or anxiety, speech and thought processing grossly intact  LABS: none today    Chemistry      Component Value Date/Time  NA 140 05/02/2018 1143   K 3.4 (L) 05/02/2018 1143   CL 103 05/02/2018 1143   CO2 29 05/02/2018 1143   BUN 9 05/02/2018 1143   CREATININE 0.76 05/02/2018 1143      Component Value Date/Time   CALCIUM 8.9 05/02/2018 1143   ALKPHOS 54 01/26/2018 1140   AST 14 01/26/2018 1140   ALT 10 01/26/2018 1140   BILITOT 0.3 01/26/2018 1140     Lab Results  Component Value Date   WBC 6.0 05/02/2018   HGB 13.1 05/02/2018   HCT 40.1 05/02/2018   MCV 90.3 05/02/2018   PLT 344 05/02/2018    ASSESSMENT AND PLAN:  Discussed the following assessment and plan:  Acute L LB pain + n/v, unknown etiology, resolved after 1-2 hrs. Tolerating PO now. Past hx of large stone per pt report, ? What side it was on. Sx's could certainly be explained by ureterolithiasis but w/out gross hematuria AND with the pain completely resolved for over 8 hrs so I don't think further eval to look for this is indicated at this time. Signs/symptoms to call or return for were reviewed and pt expressed understanding. If similar episode comes in the next 24h or so then I would get CT to check for obstructing stone. She has tramadol and anti-emetic to use prn. Encouraged pt to drink as much fluids as possible.  -we discussed possible serious and likely etiologies, options for evaluation and workup, limitations of telemedicine visit vs in person visit, treatment, treatment risks and precautions. Pt prefers  to treat via telemedicine empirically rather then risking or undertaking an in person visit at this moment. Patient agrees to seek prompt in person care if worsening, new symptoms arise, or if is not improving with treatment.   I discussed the assessment and treatment plan with the patient. The patient was provided an opportunity to ask questions and all were answered. The patient agreed with the plan and demonstrated an understanding of the instructions.   The patient was advised to call back or seek an in-person evaluation if the symptoms worsen or if the condition fails to improve as anticipated.  F/u: if not improving or if pain recurs.  Signed:  Santiago Bumpers, MD           06/14/2019

## 2019-06-19 DIAGNOSIS — M24074 Loose body in right toe joint(s): Secondary | ICD-10-CM | POA: Insufficient documentation

## 2019-06-20 ENCOUNTER — Telehealth: Payer: Self-pay | Admitting: Family Medicine

## 2019-06-20 NOTE — Telephone Encounter (Signed)
Patient reports she is still vomiting from 1 week ago. She says it looks like blood in urine. Patient asked if she could go to ER.  Spoke to nurse. I was told to tell patient to go to ER, if scans needed to be done, they could be done at that time. Patient understood. Patient will report to ER.  No call back needed.

## 2019-07-12 ENCOUNTER — Telehealth (HOSPITAL_COMMUNITY): Payer: Self-pay

## 2019-07-12 ENCOUNTER — Telehealth (HOSPITAL_COMMUNITY): Payer: Self-pay | Admitting: *Deleted

## 2019-07-12 NOTE — Telephone Encounter (Signed)
Writer returned pt message regarding the Lamictal "not working" and making her feel "worse" with increased crying spells. Pt encouraged to call writer back.

## 2019-07-12 NOTE — Telephone Encounter (Signed)
Patient called regarding her Lamotrigine 25mg . She stated that she just had surgery and she's more depressed. She also stated that family members have said that she's more irritable and that she crys over nothing. Patient stated that she doesn't think the Lamotrigine is helping and is interested in stopping it but doesn't want to go on any more meds. She has a scheduled appointment on 07/24/19. Please review and advise. Thank you.

## 2019-07-12 NOTE — Telephone Encounter (Signed)
If Lamictal making her more depressed than she should to stop.  She is taking very low-dose and should not have any withdrawal symptoms.  For now continue Wellbutrin.  We have talked about reconsider ECT and she agreed since it helped her a lot in the past.  Can you call [place] [facility] to see if she can restart ECT with Dr. Weber Cooks.

## 2019-07-21 ENCOUNTER — Encounter: Payer: Self-pay | Admitting: Family Medicine

## 2019-07-21 ENCOUNTER — Ambulatory Visit (INDEPENDENT_AMBULATORY_CARE_PROVIDER_SITE_OTHER): Payer: 59 | Admitting: Family Medicine

## 2019-07-21 VITALS — BP 109/79 | Temp 97.3°F | Ht 61.0 in | Wt 92.4 lb

## 2019-07-21 DIAGNOSIS — J029 Acute pharyngitis, unspecified: Secondary | ICD-10-CM | POA: Diagnosis not present

## 2019-07-21 DIAGNOSIS — J3489 Other specified disorders of nose and nasal sinuses: Secondary | ICD-10-CM | POA: Diagnosis not present

## 2019-07-21 MED ORDER — FLUTICASONE PROPIONATE 50 MCG/ACT NA SUSP: 2.0000 | Freq: Every day | NASAL | 6 refills | Status: DC

## 2019-07-21 MED ORDER — CETIRIZINE HCL 10 MG PO TABS: 10.0000 mg | ORAL_TABLET | Freq: Every day | ORAL | 5 refills | Status: DC

## 2019-07-21 MED ORDER — OMEPRAZOLE 20 MG PO CPDR: 20.0000 mg | DELAYED_RELEASE_CAPSULE | Freq: Every day | ORAL | 3 refills | Status: DC

## 2019-07-21 NOTE — Telephone Encounter (Signed)
Called patient to relay the message from the doctor but got her voicemail. Left message for pt to call me back.

## 2019-07-21 NOTE — Patient Instructions (Signed)
Gastroesophageal Reflux Disease, Adult Gastroesophageal reflux (GER) happens when acid from the stomach flows up into the tube that connects the mouth and the stomach (esophagus). Normally, food travels down the esophagus and stays in the stomach to be digested. However, when a person has GER, food and stomach acid sometimes move back up into the esophagus. If this becomes a more serious problem, the person may be diagnosed with a disease called gastroesophageal reflux disease (GERD). GERD occurs when the reflux:  Happens often.  Causes frequent or severe symptoms.  Causes problems such as damage to the esophagus. When stomach acid comes in contact with the esophagus, the acid may cause soreness (inflammation) in the esophagus. Over time, GERD may create small holes (ulcers) in the lining of the esophagus. What are the causes? This condition is caused by a problem with the muscle between the esophagus and the stomach (lower esophageal sphincter, or LES). Normally, the LES muscle closes after food passes through the esophagus to the stomach. When the LES is weakened or abnormal, it does not close properly, and that allows food and stomach acid to go back up into the esophagus. The LES can be weakened by certain dietary substances, medicines, and medical conditions, including:  Tobacco use.  Pregnancy.  Having a hiatal hernia.  Alcohol use.  Certain foods and beverages, such as coffee, chocolate, onions, and peppermint. What increases the risk? You are more likely to develop this condition if you:  Have an increased body weight.  Have a connective tissue disorder.  Use NSAID medicines. What are the signs or symptoms? Symptoms of this condition include:  Heartburn.  Difficult or painful swallowing.  The feeling of having a lump in the throat.  Abitter taste in the mouth.  Bad breath.  Having a large amount of saliva.  Having an upset or bloated  stomach.  Belching.  Chest pain. Different conditions can cause chest pain. Make sure you see your health care provider if you experience chest pain.  Shortness of breath or wheezing.  Ongoing (chronic) cough or a night-time cough.  Wearing away of tooth enamel.  Weight loss. How is this diagnosed? Your health care provider will take a medical history and perform a physical exam. To determine if you have mild or severe GERD, your health care provider may also monitor how you respond to treatment. You may also have tests, including:  A test to examine your stomach and esophagus with a small camera (endoscopy).  A test thatmeasures the acidity level in your esophagus.  A test thatmeasures how much pressure is on your esophagus.  A barium swallow or modified barium swallow test to show the shape, size, and functioning of your esophagus. How is this treated? The goal of treatment is to help relieve your symptoms and to prevent complications. Treatment for this condition may vary depending on how severe your symptoms are. Your health care provider may recommend:  Changes to your diet.  Medicine.  Surgery. Follow these instructions at home: Eating and drinking   Follow a diet as recommended by your health care provider. This may involve avoiding foods and drinks such as: ? Coffee and tea (with or without caffeine). ? Drinks that containalcohol. ? Energy drinks and sports drinks. ? Carbonated drinks or sodas. ? Chocolate and cocoa. ? Peppermint and mint flavorings. ? Garlic and onions. ? Horseradish. ? Spicy and acidic foods, including peppers, chili powder, curry powder, vinegar, hot sauces, and barbecue sauce. ? Citrus fruit juices and citrus   fruits, such as oranges, lemons, and limes. ? Tomato-based foods, such as red sauce, chili, salsa, and pizza with red sauce. ? Fried and fatty foods, such as donuts, french fries, potato chips, and high-fat dressings. ? High-fat  meats, such as hot dogs and fatty cuts of red and white meats, such as rib eye steak, sausage, ham, and bacon. ? High-fat dairy items, such as whole milk, butter, and cream cheese.  Eat small, frequent meals instead of large meals.  Avoid drinking large amounts of liquid with your meals.  Avoid eating meals during the 2-3 hours before bedtime.  Avoid lying down right after you eat.  Do not exercise right after you eat. Lifestyle   Do not use any products that contain nicotine or tobacco, such as cigarettes, e-cigarettes, and chewing tobacco. If you need help quitting, ask your health care provider.  Try to reduce your stress by using methods such as yoga or meditation. If you need help reducing stress, ask your health care provider.  If you are overweight, reduce your weight to an amount that is healthy for you. Ask your health care provider for guidance about a safe weight loss goal. General instructions  Pay attention to any changes in your symptoms.  Take over-the-counter and prescription medicines only as told by your health care provider. Do not take aspirin, ibuprofen, or other NSAIDs unless your health care provider told you to do so.  Wear loose-fitting clothing. Do not wear anything tight around your waist that causes pressure on your abdomen.  Raise (elevate) the head of your bed about 6 inches (15 cm).  Avoid bending over if this makes your symptoms worse.  Keep all follow-up visits as told by your health care provider. This is important. Contact a health care provider if:  You have: ? New symptoms. ? Unexplained weight loss. ? Difficulty swallowing or it hurts to swallow. ? Wheezing or a persistent cough. ? A hoarse voice.  Your symptoms do not improve with treatment. Get help right away if you:  Have pain in your arms, neck, jaw, teeth, or back.  Feel sweaty, dizzy, or light-headed.  Have chest pain or shortness of breath.  Vomit and your vomit looks  like blood or coffee grounds.  Faint.  Have stool that is bloody or black.  Cannot swallow, drink, or eat. Summary  Gastroesophageal reflux happens when acid from the stomach flows up into the esophagus. GERD is a disease in which the reflux happens often, causes frequent or severe symptoms, or causes problems such as damage to the esophagus.  Treatment for this condition may vary depending on how severe your symptoms are. Your health care provider may recommend diet and lifestyle changes, medicine, or surgery.  Contact a health care provider if you have new or worsening symptoms.  Take over-the-counter and prescription medicines only as told by your health care provider. Do not take aspirin, ibuprofen, or other NSAIDs unless your health care provider told you to do so.  Keep all follow-up visits as told by your health care provider. This is important. This information is not intended to replace advice given to you by your health care provider. Make sure you discuss any questions you have with your health care provider. Document Revised: 01/05/2018 Document Reviewed: 01/05/2018 Elsevier Patient Education  2020 [employer]  

## 2019-07-21 NOTE — Progress Notes (Signed)
VIRTUAL VISIT VIA VIDEO  I connected with Penni Homans on 07/21/19 at 10:00 AM EST by a video enabled telemedicine application and verified that I am speaking with the correct person using two identifiers. Location patient: Home Location provider: [facility], Office Persons participating in the virtual visit: Patient, Dr. Claiborne Billings and R.Baker, LPN  I discussed the limitations of evaluation and management by telemedicine and the availability of in person appointments. The patient expressed understanding and agreed to proceed.   SUBJECTIVE Chief Complaint  Patient presents with  . Bad taste in mouth    0 Pt has bad taste in mouth x2 weeks after having really bad indigistion and sore throat which is causing her to not want to eat. Since she is not eating much she has had a bad headache.     HPI: Anne Hayden is a 49 y.o. female presents today secondary to having a bad taste in her mouth for 2 weeks and severely bad indigestion.  She reports she started over-the-counter Nexium and the indigestion is better.  She still has a bad taste in her mouth and she now has a feeling of nasal drainage/sore throat.  She reports she is not eating as much secondary to the sour taste in her mouth.  She is worried she is going to lose more weight secondary to not eating as much.  She reports she had been lightheaded over the last 4 to 5 days as well with a mild headache secondary to not eating well.  She denies any fever, chills, short of breath, vomit or diarrhea.  She reports she typically is rather constipated but had normal stools over the last few days.  She did have general anesthesia the day prior to the onset of her sore throat and reports of phlegm in her throat.  She reports she has Zofran for the nausea.  ROS: See pertinent positives and negatives per HPI.  Patient Active Problem List   Diagnosis Date Noted  . Hallux rigidus of right foot 12/12/2018  . Acne 09/13/2018  . Depression  with anxiety   . OCD (obsessive compulsive disorder)   . Vitamin D deficiency   . Osteoarthritis of ankle and foot 09/09/2017  . CIN I (cervical intraepithelial neoplasia I) 07/28/2013  . Family history of breast cancer 07/28/2013  . 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 Smoking status: Never Smoker  . Smokeless tobacco: Never Used  Substance Use Topics  . Alcohol use: Not Currently    Comment: rare    Current Outpatient Medications:  .  buPROPion (WELLBUTRIN XL) 300 MG 24 hr tablet, Take 1 tablet (300 mg total) by mouth daily., Disp: 90 tablet, Rfl: 0 .  clindamycin (CLEOCIN T) 1 % lotion, clindamycin 1 % lotion, Disp: , Rfl:  .  clonazePAM (KLONOPIN) 0.5 MG tablet, Use twice daily and may use 1 daily PRN (not more than 15 x a month), Disp: 75 tablet, Rfl: 2 .  esomeprazole (NEXIUM) 20 MG capsule, Take 20 mg by mouth daily at 12 noon., Disp: , Rfl:  .  ketoconazole (NIZORAL) 2 % shampoo, ketoconazole 2 % shampoo, Disp: , Rfl:  .  norethindrone-ethinyl estradiol (BALZIVA) 0.4-35 MG-MCG tablet, Take 1 tablet by mouth daily., Disp: 3 Package, Rfl: 4 .  Sod Fluoride-Potassium Nitrate (PREVIDENT 5000 SENSITIVE) 1.1-5 % PSTE, PreviDent 5000 Sensitive 1.1 %-5 % dental paste, Disp: , Rfl:  .  tretinoin (RETIN-A) 0.1 % cream, tretinoin  0.1 % topical cream  APPLY A PEA SIZE AMOUNT TO FULL FACE AT BEDTIME, Disp: 45 g, Rfl: 5 .  citalopram (CELEXA) 40 MG tablet, Take 1 tablet (40 mg total) by mouth daily. (Patient not taking: Reported on 07/21/2019), Disp: 90 tablet, Rfl: 0 .  lamoTRIgine (LAMICTAL) 25 MG tablet, Take one tab daily for one week and than two tab daily (Patient not taking: Reported on 07/21/2019), Disp: 60 tablet, Rfl: 2 .  VITAMIN D PO, Take by mouth., Disp: , Rfl:   No Known Allergies  OBJECTIVE: BP 109/79   Temp (!) 97.3 F (36.3 C) (Temporal)   Ht 5\' 1"  (1.549 m)   Wt 92 lb 6 oz (41.9 kg)   LMP 06/19/2019 (Exact Date)   BMI 17.45 kg/m  Gen: No acute  distress. Nontoxic in appearance.  HENT: AT. [place].  MMM.  Eyes:Pupils Equal Round Reactive to light, Extraocular movements intact,  Conjunctiva without redness, discharge or icterus. Chest: Cough or shortness of breath not present on exam Neuro: . Alert. Oriented x3   ASSESSMENT AND PLAN: TRANESHA LESSNER is a 49 y.o. female present for  Throat soreness/Nasal drainage Patient has reports of increased indigestion that seems to have improved after her starting over-the-counter Nexium.  Then followed with increased phlegm production, sour taste in her mouth and mild throat soreness after having general anesthesia. -Covid test was negative prior to surgery. Patient's main concern today seem to be the sour taste in her mouth/throat and her difficulty eating secondary to it.  Encouraged her to continue the Nexium for at least 2 weeks.  This has been called in for her in the event she runs out of her over-the-counter meds. We also discussed the possibility of this being drainage from the sinuses versus side effect to her recently having general anesthesia.  Encouraged her to start an antihistamine and prescribe Zyrtec nightly.  She is to start Flonase nasal spray daily. Discussed warm tea/honey and flavored lozenges and attempt to help her with the sour taste in her mouth.  She states she has been trying all those and nothing seems to be working. She does not have any infectious signs/symptoms.  Encouraged her to monitor for any fevers chills sinus pressure pain or cough, if these develop then follow-up at that time.  No orders of the defined types were placed in this encounter.  Meds ordered this encounter  Medications  . cetirizine (ZYRTEC) 10 MG tablet    Sig: Take 1 tablet (10 mg total) by mouth at bedtime.    Dispense:  30 tablet    Refill:  5  . fluticasone (FLONASE) 50 MCG/ACT nasal spray    Sig: Place 2 sprays into both nostrils daily.    Dispense:  16 g    Refill:  6  . omeprazole  (PRILOSEC) 20 MG capsule    Sig: Take 1 capsule (20 mg total) by mouth daily.    Dispense:  30 capsule    Refill:  [place], DO 07/21/2019

## 2019-07-24 ENCOUNTER — Other Ambulatory Visit: Payer: Self-pay

## 2019-07-24 ENCOUNTER — Encounter (HOSPITAL_COMMUNITY): Payer: Self-pay | Admitting: Psychiatry

## 2019-07-24 ENCOUNTER — Ambulatory Visit (INDEPENDENT_AMBULATORY_CARE_PROVIDER_SITE_OTHER): Payer: 59 | Admitting: Psychiatry

## 2019-07-24 DIAGNOSIS — F331 Major depressive disorder, recurrent, moderate: Secondary | ICD-10-CM | POA: Diagnosis not present

## 2019-07-24 DIAGNOSIS — F429 Obsessive-compulsive disorder, unspecified: Secondary | ICD-10-CM

## 2019-07-24 DIAGNOSIS — F411 Generalized anxiety disorder: Secondary | ICD-10-CM | POA: Diagnosis not present

## 2019-07-24 MED ORDER — CLONAZEPAM 0.5 MG PO TABS: ORAL_TABLET | ORAL | 2 refills | Status: DC

## 2019-07-24 MED ORDER — BUPROPION HCL ER (XL) 300 MG PO TB24: 300.0000 mg | ORAL_TABLET | Freq: Every day | ORAL | 0 refills | Status: DC

## 2019-07-24 NOTE — Progress Notes (Signed)
Virtual Visit via Telephone Note  I connected with Anne Hayden on 07/24/19 at  2:40 PM EST by telephone and verified that I am speaking with the correct person using two identifiers.   I discussed the limitations, risks, security and privacy concerns of performing an evaluation and management service by telephone and the availability of in person appointments. I also discussed with the patient that there may be a patient responsible charge related to this service. The patient expressed understanding and agreed to proceed.   History of Present Illness: Patient was evaluated by phone session.  On the last visit we tried Lamictal as patient continued to have obsessive thoughts but she could not tolerate well.  She noticed it is making her more tired sleepy and she is more depressed.  She recalled taking Lamictal when she was in [place] but at that time she did not feel it caused side effects.  Patient also recently had foot surgery and she has a hard time tolerating the surgery.  She cannot walk and ambulatory as much.  She is not sure if the surgery has caused something change in her bowel.  She feels tired all the time.  She has nausea and had a bad taste.  We have recommended ECT but patient is still taking time to think about it and she wants to have physical symptoms get under control.  She takes Klonopin and Wellbutrin.  At this time she like to continue the same medication Klonopin and Wellbutrin and want to hold off to add new medication or PCP until her physical symptoms get better.  Patient denies any suicidal thoughts or homicidal thoughts.  She denies any crying spells.  She had a good support from her husband and parents.   Past Psychiatric History:Reviewed. H/Oanxiety, OCDand depression.Admitted [facility] in 2010 due to severe depression and suicidal thoughts. No h/osuicidal attempt. Seroquel cause weightgain, Risperdal caused nausea, Trintellix cause itching, Geodon,  Neurontin,Paxiland Abilifydid not help. [place] E[place] helped.Recall history of mania and irritability.   Psychiatric Specialty Exam: Physical Exam  Review of Systems  Gastrointestinal: Positive for nausea.       Bad taste    There were no vitals taken for this visit.There is no height or weight on file to calculate BMI.  General Appearance: NA  Eye Contact:  NA  Speech:  Slow  Volume:  Decreased  Mood:  Anxious and Dysphoric  Affect:  NA  Thought Process:  Goal Directed  Orientation:  Full (Time, Place, and Person)  Thought Content:  Obsessions and Rumination  Suicidal Thoughts:  No  Homicidal Thoughts:  No  Memory:  Immediate;   Good Recent;   Fair Remote;   Fair  Judgement:  Fair  Insight:  Present  Psychomotor Activity:  NA  Concentration:  Concentration: Fair and Attention Span: Fair  Recall:  [employer] of Knowledge:  Fair  Language:  Good  Akathisia:  No  Handed:  Right  AIMS (if indicated):     Assets:  Communication Skills Desire for Improvement Housing Resilience Social Support  ADL's:  Intact  Cognition:  WNL  Sleep:   8 hrs      Assessment and Plan: Major depressive disorder, recurrent.  OCD.  Anxiety.  Discontinue Lamictal as patient do not see any improvement with the addition.  Continue Klonopin 0.5 mg twice a day and third as needed and Wellbutrin XL 300 mg daily.  Patient will call us if  she decided ECT.  Discussed medication side effects and benefits.  Recommended to call us back if she has any question of any concern.  Follow-up in 3 months.  Follow Up Instructions:    I discussed the assessment and treatment plan with the patient. The patient was provided an opportunity to ask questions and all were answered. The patient agreed with the plan and demonstrated an understanding of the instructions.   The patient was advised to call back or seek an in-person evaluation if the symptoms worsen or if  the condition fails to improve as anticipated.  I provided 20 minutes of non-face-to-face time during this encounter.   Kathlee Nations, MD

## 2019-08-01 ENCOUNTER — Ambulatory Visit (INDEPENDENT_AMBULATORY_CARE_PROVIDER_SITE_OTHER): Payer: 59 | Admitting: Family Medicine

## 2019-08-01 ENCOUNTER — Encounter: Payer: Self-pay | Admitting: Family Medicine

## 2019-08-01 ENCOUNTER — Other Ambulatory Visit: Payer: Self-pay

## 2019-08-01 VITALS — BP 115/81 | HR 96 | Temp 98.2°F | Resp 17 | Ht 61.0 in | Wt 95.4 lb

## 2019-08-01 DIAGNOSIS — J029 Acute pharyngitis, unspecified: Secondary | ICD-10-CM | POA: Diagnosis not present

## 2019-08-01 DIAGNOSIS — J019 Acute sinusitis, unspecified: Secondary | ICD-10-CM | POA: Diagnosis not present

## 2019-08-01 MED ORDER — CEFDINIR 300 MG PO CAPS: 300.0000 mg | ORAL_CAPSULE | Freq: Two times a day (BID) | ORAL | 0 refills | Status: DC

## 2019-08-01 NOTE — Progress Notes (Signed)
SUBJECTIVE Chief Complaint  Patient presents with  . Sore Throat    Pt still has sore throat, bad taste in mouth, and feels like something is stuck in her throat. Pt has not been using Nexium. Feels nauseated at times. Pt states foods taste different. Pt states salty foods taste better. She states the taste of water has improved.     HPI: Anne Hayden is a 49 y.o. female presents today to follow up  Patient presents today to follow-up on her upper respiratory symptoms.  Patient states that she is not feeling any improvement.  She is still feeling rather significant nasal congestion and increased phlegm production.  She states her symptoms seem to be better at night.  She still having mild nausea.  She is not using the Nexium. Prior note:  secondary to having a bad taste in her mouth for 2 weeks and severely bad indigestion.  She reports she started over-the-counter Nexium and the indigestion is better.  She still has a bad taste in her mouth and she now has a feeling of nasal drainage/sore throat.  She reports she is not eating as much secondary to the sour taste in her mouth.  She is worried she is going to lose more weight secondary to not eating as much.  She reports she had been lightheaded over the last 4 to 5 days as well with a mild headache secondary to not eating well.  She denies any fever, chills, short of breath, vomit or diarrhea.  She reports she typically is rather constipated but had normal stools over the last few days.  She did have general anesthesia the day prior to the onset of her sore throat and reports of phlegm in her throat.  She reports she has Zofran for the nausea.  ROS: See pertinent positives and negatives per HPI.  Patient Active Problem List   Diagnosis Date Noted  . Loose body in right toe joint 06/19/2019  . Hallux rigidus of right foot 12/12/2018  . Acne 09/13/2018  . Depression with anxiety   . OCD (obsessive compulsive disorder)   . Vitamin D  deficiency   . Osteoarthritis of ankle and foot 09/09/2017  . CIN I (cervical intraepithelial neoplasia I) 07/28/2013  . Family history of breast cancer 07/28/2013  . 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 Smoking status: Never Smoker  . Smokeless tobacco: Never Used  Substance Use Topics  . Alcohol use: Not Currently    Comment: rare    Current Outpatient Medications:  .  buPROPion (WELLBUTRIN XL) 300 MG 24 hr tablet, Take 1 tablet (300 mg total) by mouth daily., Disp: 90 tablet, Rfl: 0 .  cetirizine (ZYRTEC) 10 MG tablet, Take 1 tablet (10 mg total) by mouth at bedtime., Disp: 30 tablet, Rfl: 5 .  clindamycin (CLEOCIN T) 1 % lotion, clindamycin 1 % lotion, Disp: , Rfl:  .  clonazePAM (KLONOPIN) 0.5 MG tablet, Use twice daily and may use 1 daily PRN (not more than 15 x a month), Disp: 75 tablet, Rfl: 2 .  fluticasone (FLONASE) 50 MCG/ACT nasal spray, Place 2 sprays into both nostrils daily., Disp: 16 g, Rfl: 6 .  ketoconazole (NIZORAL) 2 % shampoo, ketoconazole 2 % shampoo, Disp: , Rfl:  .  norethindrone-ethinyl estradiol (BALZIVA) 0.4-35 MG-MCG tablet, Take 1 tablet by mouth daily., Disp: 3 Package, Rfl: 4 .  omeprazole (PRILOSEC) 20 MG capsule, Take 1 capsule (20 mg total)  by mouth daily., Disp: 30 capsule, Rfl: 3 .  Sod Fluoride-Potassium Nitrate (PREVIDENT 5000 SENSITIVE) 1.1-5 % PSTE, PreviDent 5000 Sensitive 1.1 %-5 % dental paste, Disp: , Rfl:  .  tretinoin (RETIN-A) 0.1 % cream, tretinoin 0.1 % topical cream  APPLY A PEA SIZE AMOUNT TO FULL FACE AT BEDTIME, Disp: 45 g, Rfl: 5 .  VITAMIN D PO, Take by mouth., Disp: , Rfl:  .  cefdinir (OMNICEF) 300 MG capsule, Take 1 capsule (300 mg total) by mouth 2 (two) times daily., Disp: 20 capsule, Rfl: 0  No Known Allergies  OBJECTIVE: BP 115/81 (BP Location: Left Arm, Patient Position: Sitting, Cuff Size: Normal)   Pulse 96   Temp 98.2 F (36.8 C) (Temporal)   Resp 17   Ht 5\' 1"  (1.549 m)   Wt 95 lb 6 oz (43.3  kg)   LMP 06/06/2019   SpO2 99%   BMI 18.02 kg/m  Gen: Afebrile. No acute distress.  HENT: AT. [place]. Bilateral TM visualized and normal in appearance. MMM. Bilateral nares with erythema and mild swelling, no drainage noted. Throat with mild erythema.  No exudates.  Postnasal drip present.  No cough or hoarseness present. Eyes:Pupils Equal Round Reactive to light, Extraocular movements intact,  Conjunctiva without redness, discharge or icterus. Neck/lymp/endocrine: Supple, right submandibular lymphadenopathy present.   CV: RRR Chest: CTAB, no wheeze or crackles Skin: no rashes, purpura or petechiae.  Neuro:  Normal gait. PERLA. EOMi. Alert. Oriented.    ASSESSMENT AND PLAN: Anne Hayden is a 49 y.o. female present for  Pharyngitis, unspecified etiology/Acute non-recurrent sinusitis, unspecified location Rest, hydrate.  +/- flonase, mucinex (DM if cough), nettie pot or nasal saline.  Omnicef twice daily prescribed, take until completed.  If cough present it can last up to 6-8 weeks.  F/U 2 weeks of not improved.     No orders of the defined types were placed in this encounter.  Meds ordered this encounter  Medications  . cefdinir (OMNICEF) 300 MG capsule    Sig: Take 1 capsule (300 mg total) by mouth 2 (two) times daily.    Dispense:  20 capsule    Refill:  0      Anne Hayden 58, DO 08/02/2019

## 2019-08-01 NOTE — Patient Instructions (Signed)
start mucinex- this will help decrease the amount of phlegm production.  I called in cefdinir/omnicef every 12 hrs for 10 days.    Sinusitis, Adult Sinusitis is soreness and swelling (inflammation) of your sinuses. Sinuses are hollow spaces in the bones around your face. They are located:  Around your eyes.  In the middle of your forehead.  Behind your nose.  In your cheekbones. Your sinuses and nasal passages are lined with a fluid called mucus. Mucus drains out of your sinuses. Swelling can trap mucus in your sinuses. This lets germs (bacteria, virus, or fungus) grow, which leads to infection. Most of the time, this condition is caused by a virus. What are the causes? This condition is caused by:  Allergies.  Asthma.  Germs.  Things that block your nose or sinuses.  Growths in the nose (nasal polyps).  Chemicals or irritants in the air.  Fungus (rare). What increases the risk? You are more likely to develop this condition if:  You have a weak body defense system (immune system).  You do a lot of swimming or diving.  You use nasal sprays too much.  You smoke. What are the signs or symptoms? The main symptoms of this condition are pain and a feeling of pressure around the sinuses. Other symptoms include:  Stuffy nose (congestion).  Runny nose (drainage).  Swelling and warmth in the sinuses.  Headache.  Toothache.  A cough that may get worse at night.  Mucus that collects in the throat or the back of the nose (postnasal drip).  Being unable to smell and taste.  Being very tired (fatigue).  A fever.  Sore throat.  Bad breath. How is this diagnosed? This condition is diagnosed based on:  Your symptoms.  Your medical history.  A physical exam.  Tests to find out if your condition is short-term (acute) or long-term (chronic). Your doctor may: ? Check your nose for growths (polyps). ? Check your sinuses using a tool that has a light  (endoscope). ? Check for allergies or germs. ? Do imaging tests, such as an MRI or CT scan. How is this treated? Treatment for this condition depends on the cause and whether it is short-term or long-term.  If caused by a virus, your symptoms should go away on their own within 10 days. You may be given medicines to relieve symptoms. They include: ? Medicines that shrink swollen tissue in the nose. ? Medicines that treat allergies (antihistamines). ? A spray that treats swelling of the nostrils. ? Rinses that help get rid of thick mucus in your nose (nasal saline washes).  If caused by bacteria, your doctor may wait to see if you will get better without treatment. You may be given antibiotic medicine if you have: ? A very bad infection. ? A weak body defense system.  If caused by growths in the nose, you may need to have surgery. Follow these instructions at home: Medicines  Take, use, or apply over-the-counter and prescription medicines only as told by your doctor. These may include nasal sprays.  If you were prescribed an antibiotic medicine, take it as told by your doctor. Do not stop taking the antibiotic even if you start to feel better. Hydrate and humidify   Drink enough water to keep your pee (urine) pale yellow.  Use a cool mist humidifier to keep the humidity level in your home above 50%.  Breathe in steam for 10-15 minutes, 3-4 times a day, or as told by your  doctor. You can do this in the bathroom while a hot shower is running.  Try not to spend time in cool or dry air. Rest  Rest as much as you can.  Sleep with your head raised (elevated).  Make sure you get enough sleep each night. General instructions   Put a warm, moist washcloth on your face 3-4 times a day, or as often as told by your doctor. This will help with discomfort.  Wash your hands often with soap and water. If there is no soap and water, use hand sanitizer.  Do not smoke. Avoid being around  people who are smoking (secondhand smoke).  Keep all follow-up visits as told by your doctor. This is important. Contact a doctor if:  You have a fever.  Your symptoms get worse.  Your symptoms do not get better within 10 days. Get help right away if:  You have a very bad headache.  You cannot stop throwing up (vomiting).  You have very bad pain or swelling around your face or eyes.  You have trouble seeing.  You feel confused.  Your neck is stiff.  You have trouble breathing. Summary  Sinusitis is swelling of your sinuses. Sinuses are hollow spaces in the bones around your face.  This condition is caused by tissues in your nose that become inflamed or swollen. This traps germs. These can lead to infection.  If you were prescribed an antibiotic medicine, take it as told by your doctor. Do not stop taking it even if you start to feel better.  Keep all follow-up visits as told by your doctor. This is important. This information is not intended to replace advice given to you by your health care provider. Make sure you discuss any questions you have with your health care provider. Document Revised: 11/29/2017 Document Reviewed: 11/29/2017 Elsevier Patient Education  [place].

## 2019-08-02 ENCOUNTER — Encounter: Payer: Self-pay | Admitting: Family Medicine

## 2019-08-09 ENCOUNTER — Telehealth: Payer: Self-pay | Admitting: Family Medicine

## 2019-08-09 DIAGNOSIS — J029 Acute pharyngitis, unspecified: Secondary | ICD-10-CM

## 2019-08-09 NOTE — Telephone Encounter (Signed)
Patient wants referral to ENT.  Please call patient at [phone].

## 2019-08-09 NOTE — Telephone Encounter (Signed)
Pt has been seen 2x for sore throat and drainage. Please advise.

## 2019-08-09 NOTE — Telephone Encounter (Signed)
ent referral placed

## 2019-08-09 NOTE — Addendum Note (Signed)
Addended by: Felix Pacini A on: 08/09/2019 04:53 PM   Modules accepted: Orders

## 2019-08-10 NOTE — Telephone Encounter (Signed)
Pt was called and detailed VM was left giving her information.

## 2019-08-14 ENCOUNTER — Other Ambulatory Visit: Payer: Self-pay | Admitting: Obstetrics & Gynecology

## 2019-08-14 ENCOUNTER — Telehealth (HOSPITAL_COMMUNITY): Payer: Self-pay | Admitting: *Deleted

## 2019-08-14 NOTE — Telephone Encounter (Signed)
She is taking Wellbutrin for a long time and never mentioned about issue with taste and smell.  She should have Covid test and if it is negative she can try coming off from Wellbutrin to see if symptoms resolve.  If symptoms do not resolve then she need to go back on Wellbutrin.

## 2019-08-14 NOTE — Telephone Encounter (Signed)
Pt called today c/o weight loss due to decreased appetite and "problems" with her "smell and taste". Pt was seen by ENT on 08/01/19 and given RX for Mucinex and Omnicef per provider note. Pt states that ENT says these s/s must be related to the Wellbutrin which pt has been taking for awhile now. Please review and advise.

## 2019-08-14 NOTE — Telephone Encounter (Signed)
Annual exam scheduled 09/12/19.

## 2019-08-18 ENCOUNTER — Telehealth (HOSPITAL_COMMUNITY): Payer: Self-pay | Admitting: *Deleted

## 2019-08-18 NOTE — Telephone Encounter (Signed)
Pt called to share that she's had two COVID tests, both negative, and that the ENT has recommended she come off the Wellbutrin. Pt does have Klonopin and still has some Wellbutrin 150mg  left so will taper down then d/c medication all together.

## 2019-08-21 NOTE — Telephone Encounter (Signed)
Ok. If she started to get depressed after stopping Wellbutrin than she should call us back.

## 2019-09-12 ENCOUNTER — Encounter: Payer: Self-pay | Admitting: Obstetrics & Gynecology

## 2019-09-12 ENCOUNTER — Other Ambulatory Visit: Payer: Self-pay

## 2019-09-12 ENCOUNTER — Ambulatory Visit (INDEPENDENT_AMBULATORY_CARE_PROVIDER_SITE_OTHER): Payer: 59 | Admitting: Obstetrics & Gynecology

## 2019-09-12 VITALS — BP 102/68 | Ht 61.0 in | Wt 97.2 lb

## 2019-09-12 DIAGNOSIS — Z01419 Encounter for gynecological examination (general) (routine) without abnormal findings: Secondary | ICD-10-CM | POA: Diagnosis not present

## 2019-09-12 DIAGNOSIS — Z3041 Encounter for surveillance of contraceptive pills: Secondary | ICD-10-CM

## 2019-09-12 DIAGNOSIS — Z8742 Personal history of other diseases of the female genital tract: Secondary | ICD-10-CM

## 2019-09-12 MED ORDER — BALZIVA 0.4-35 MG-MCG PO TABS: 1.0000 | ORAL_TABLET | Freq: Every day | ORAL | 4 refills | Status: DC

## 2019-09-12 NOTE — Progress Notes (Signed)
Anne Hayden [facility] 10/27/1970 [id number]   History:    49 y.o. G1P0A1 Married x 8 yrs.    RP:  New patient presenting for annual gyn exam   HPI: Well on Balziva.  No BTB.  No pelvic pain.  Occasionally sexually active, no pain with IC.  Urine/BMs normal.  Breasts normal.  BMI 18.37, stable.  Health labs with Fam MD.   Past medical history,surgical history, family history and social history were all reviewed and documented in the EPIC chart.  Gynecologic History Patient's last menstrual period was 09/05/2019.  Obstetric History OB History  Gravida Para Term Preterm AB Living  1 0     1 0  SAB TAB Ectopic Multiple Live Births               # Outcome Date GA Lbr Len/2nd Weight Sex Delivery Anes PTL Lv  1 AB              ROS: A ROS was performed and pertinent positives and negatives are included in the history.  GENERAL: No fevers or chills. HEENT: No change in vision, no earache, sore throat or sinus congestion. NECK: No pain or stiffness. CARDIOVASCULAR: No chest pain or pressure. No palpitations. PULMONARY: No shortness of breath, cough or wheeze. GASTROINTESTINAL: No abdominal pain, nausea, vomiting or diarrhea, melena or bright red blood per rectum. GENITOURINARY: No urinary frequency, urgency, hesitancy or dysuria. MUSCULOSKELETAL: No joint or muscle pain, no back pain, no recent trauma. DERMATOLOGIC: No rash, no itching, no lesions. ENDOCRINE: No polyuria, polydipsia, no heat or cold intolerance. No recent change in weight. HEMATOLOGICAL: No anemia or easy bruising or bleeding. NEUROLOGIC: No headache, seizures, numbness, tingling or weakness. PSYCHIATRIC: No depression, no loss of interest in normal activity or change in sleep pattern.     Exam:   BP 102/68   Ht 5\' 1"  (1.549 m)   Wt 97 lb 3.2 oz (44.1 kg)   LMP 09/05/2019 Comment: pill  BMI 18.37 kg/m   Body mass index is 18.37 kg/m.  General appearance : Well developed well nourished female. No acute  distress HEENT: Eyes: no retinal hemorrhage or exudates,  Neck supple, trachea midline, no carotid bruits, no thyroidmegaly Lungs: Clear to auscultation, no rhonchi or wheezes, or rib retractions  Heart: Regular rate and rhythm, no murmurs or gallops Breast:Examined in sitting and supine position were symmetrical in appearance, no palpable masses or tenderness,  no skin retraction, no nipple inversion, no nipple discharge, no skin discoloration, no axillary or supraclavicular lymphadenopathy Abdomen: no palpable masses or tenderness, no rebound or guarding Extremities: no edema or skin discoloration or tenderness  Pelvic: Vulva: Normal             Vagina: No gross lesions or discharge  Cervix: No gross lesions or discharge.  Pap reflex done.    Uterus  AV, normal size, shape and consistency, non-tender and mobile  Adnexa  Without masses or tenderness  Anus: Normal   Assessment/Plan:  49 y.o. female for annual exam   1. Encounter for routine gynecological examination with Papanicolaou smear of cervix Normal gynecologic exam.  Pap reflex done.  Breast exam normal.  Screening mammogram June 2020 was negative.  Health labs with family physician.  Body mass index stable at 18.37.  Will restart on fitness as her right big toe heals.  2. H/O abnormal cervical Papanicolaou smear Previous Colpo x 5, but per patient no LEEP or other treatment needed.  3. Encounter for  surveillance of contraceptive pills Well on Balziva.  No CI to continue on BCPs.  Prescription sent to pharmacy.  Other orders - citalopram (CELEXA) 40 MG tablet; Take 40 mg by mouth daily. - norethindrone-ethinyl estradiol (BALZIVA) 0.4-35 MG-MCG tablet; Take 1 tablet by mouth daily.  Princess Bruins MD, 2:52 PM 09/12/2019

## 2019-09-12 NOTE — Patient Instructions (Signed)
1. Encounter for routine gynecological examination with Papanicolaou smear of cervix Normal gynecologic exam.  Pap reflex done.  Breast exam normal.  Screening mammogram June 2020 was negative.  Health labs with family physician.  Body mass index stable at 18.37.  Will restart on fitness as her right big toe heals.  2. H/O abnormal cervical Papanicolaou smear Previous Colpo x 5, but per patient no LEEP or other treatment needed.  3. Encounter for surveillance of contraceptive pills Well on Balziva.  No CI to continue on BCPs.  Prescription sent to pharmacy.  Other orders - citalopram (CELEXA) 40 MG tablet; Take 40 mg by mouth daily. - norethindrone-ethinyl estradiol (BALZIVA) 0.4-35 MG-MCG tablet; Take 1 tablet by mouth daily.  Anne Hayden, it was a pleasure seeing you today!  I will inform you of your results as soon as they are available.

## 2019-09-13 LAB — PAP IG W/ RFLX HPV ASCU

## 2019-09-26 ENCOUNTER — Telehealth (HOSPITAL_COMMUNITY): Payer: Self-pay | Admitting: *Deleted

## 2019-09-26 NOTE — Telephone Encounter (Signed)
Thanks for update

## 2019-09-26 NOTE — Telephone Encounter (Signed)
Pt called to update on 'side effects" from medication as reported in my last encounter. Pt states no s/e now that she's d/c the Wellbutrin and resumed Celexa.

## 2019-09-29 ENCOUNTER — Other Ambulatory Visit: Payer: Self-pay

## 2019-09-29 ENCOUNTER — Ambulatory Visit (INDEPENDENT_AMBULATORY_CARE_PROVIDER_SITE_OTHER): Payer: 59 | Admitting: Psychiatry

## 2019-09-29 ENCOUNTER — Encounter (HOSPITAL_COMMUNITY): Payer: Self-pay | Admitting: Psychiatry

## 2019-09-29 DIAGNOSIS — F411 Generalized anxiety disorder: Secondary | ICD-10-CM

## 2019-09-29 DIAGNOSIS — F331 Major depressive disorder, recurrent, moderate: Secondary | ICD-10-CM | POA: Diagnosis not present

## 2019-09-29 DIAGNOSIS — F429 Obsessive-compulsive disorder, unspecified: Secondary | ICD-10-CM | POA: Diagnosis not present

## 2019-09-29 MED ORDER — CITALOPRAM HYDROBROMIDE 40 MG PO TABS: 40.0000 mg | ORAL_TABLET | Freq: Every day | ORAL | 2 refills | Status: DC

## 2019-09-29 MED ORDER — CLONAZEPAM 0.5 MG PO TABS: ORAL_TABLET | ORAL | 2 refills | Status: DC

## 2019-09-29 NOTE — Progress Notes (Signed)
Virtual Visit via Telephone Note  I connected with Anne Hayden on 09/29/19 at 11:00 AM EDT by telephone and verified that I am speaking with the correct person using two identifiers.   I discussed the limitations, risks, security and privacy concerns of performing an evaluation and management service by telephone and the availability of in person appointments. I also discussed with the patient that there may be a patient responsible charge related to this service. The patient expressed understanding and agreed to proceed.   History of Present Illness: Patient was evaluated by phone session.  We tried Wellbutrin but after taking for 2 weeks she had trouble with taste.  She had Covid test which was negative.  She admitted it make her more sad and depressed and she decided to stop.  She is back on citalopram but since she has not that many pills back to work she is taking half of the citalopram.  She still take Klonopin 0.5 mg twice a day and very rarely take the third dose.  She is feeling better with citalopram but like to go back to 40 mg which she used to take in the past.  She does not feel as depressed.  She has OCD and anxiety but they are under control.  She is sleeping better.  After the foot surgery she still have issues with walking but she is able to walk few steps.  She denies any paranoia, hallucination or any suicidal thoughts.  She had decided to put off ECT treatment since current medicine working.  She has no tremors, shakes or any EPS.  Her appetite is okay.  She had a good support from her husband and parents.   Past Psychiatric History:Reviewed. H/Oanxiety, OCDand depression.H/O inpatient at [facility] in 2010 for depression and suicidal thoughts. No h/osuicidal attempt. Seroquel cause weightgain, Risperdal caused nausea, Trintellix cause itching, Geodon, Neurontin,Paxiland Abilifydid not help. 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 Wellbutrin  caused loss of taste. ECT helped.Recall history of mania and irritability.  Psychiatric Specialty Exam: Physical Exam  Review of Systems  Last menstrual period 09/05/2019.There is no height or weight on file to calculate BMI.  General Appearance: NA  Eye Contact:  NA  Speech:  Slow  Volume:  Decreased  Mood:  Euthymic  Affect:  NA  Thought Process:  Goal Directed  Orientation:  Full (Time, Place, and Person)  Thought Content:  Rumination  Suicidal Thoughts:  No  Homicidal Thoughts:  No  Memory:  Immediate;   Good Recent;   Good Remote;   Fair  Judgement:  Intact  Insight:  Present  Psychomotor Activity:  NA  Concentration:  Concentration: Fair and Attention Span: Fair  Recall:  [employer] of Knowledge:  Good  Language:  Good  Akathisia:  No  Handed:  Right  AIMS (if indicated):     Assets:  Communication Skills Desire for Improvement Housing Resilience Social Support  ADL's:  Intact  Cognition:  WNL  Sleep:   better     Assessment and Plan: Major depressive disorder, recurrent.  OCD.  Anxiety.  Discontinue Wellbutrin since patient do not see any improvement and having loss of taste.  Restart Celexa 40 mg daily and Klonopin 0.5 mg twice a day and third as needed for severe anxiety.  Discussed medication side effects and benefits.  Recommended to call us back if she has any question of any concern.  Follow-up in 3 months.  Follow Up Instructions:  I discussed the assessment and treatment plan with the patient. The patient was provided an opportunity to ask questions and all were answered. The patient agreed with the plan and demonstrated an understanding of the instructions.   The patient was advised to call back or seek an in-person evaluation if the symptoms worsen or if the condition fails to improve as anticipated.  I provided 20 minutes of non-face-to-face time during this encounter.   Kathlee Nations, MD

## 2019-10-12 ENCOUNTER — Other Ambulatory Visit: Payer: Self-pay | Admitting: Family Medicine

## 2019-10-12 DIAGNOSIS — Z1231 Encounter for screening mammogram for malignant neoplasm of breast: Secondary | ICD-10-CM

## 2019-10-23 ENCOUNTER — Ambulatory Visit (HOSPITAL_COMMUNITY): Payer: 59 | Admitting: Psychiatry

## 2019-12-17 ENCOUNTER — Other Ambulatory Visit (HOSPITAL_COMMUNITY): Payer: Self-pay | Admitting: Psychiatry

## 2019-12-17 DIAGNOSIS — F429 Obsessive-compulsive disorder, unspecified: Secondary | ICD-10-CM

## 2019-12-17 DIAGNOSIS — F411 Generalized anxiety disorder: Secondary | ICD-10-CM

## 2019-12-25 ENCOUNTER — Other Ambulatory Visit: Payer: Self-pay | Admitting: Obstetrics & Gynecology

## 2019-12-27 ENCOUNTER — Ambulatory Visit
Admission: RE | Admit: 2019-12-27 | Discharge: 2019-12-27 | Disposition: A | Discharge status: Home / Self Care | Payer: 59 | Source: Ambulatory Visit | Attending: Family Medicine | Admitting: Family Medicine

## 2019-12-27 ENCOUNTER — Other Ambulatory Visit: Payer: Self-pay

## 2019-12-27 DIAGNOSIS — Z1231 Encounter for screening mammogram for malignant neoplasm of breast: Secondary | ICD-10-CM

## 2020-01-01 ENCOUNTER — Other Ambulatory Visit: Payer: Self-pay

## 2020-01-01 ENCOUNTER — Encounter (HOSPITAL_COMMUNITY): Payer: Self-pay | Admitting: Psychiatry

## 2020-01-01 ENCOUNTER — Telehealth (INDEPENDENT_AMBULATORY_CARE_PROVIDER_SITE_OTHER): Payer: 59 | Admitting: Psychiatry

## 2020-01-01 DIAGNOSIS — F331 Major depressive disorder, recurrent, moderate: Secondary | ICD-10-CM | POA: Diagnosis not present

## 2020-01-01 DIAGNOSIS — F429 Obsessive-compulsive disorder, unspecified: Secondary | ICD-10-CM

## 2020-01-01 MED ORDER — CITALOPRAM HYDROBROMIDE 40 MG PO TABS: 40.0000 mg | ORAL_TABLET | Freq: Every day | ORAL | 2 refills | Status: DC

## 2020-01-01 MED ORDER — CLONAZEPAM 0.5 MG PO TABS: ORAL_TABLET | ORAL | 2 refills | Status: DC

## 2020-01-01 NOTE — Progress Notes (Signed)
Virtual Visit via Telephone Note  I connected with Anne Hayden on 01/01/20 at 10:40 AM EDT by telephone and verified that I am speaking with the correct person using two identifiers.   I discussed the limitations, risks, security and privacy concerns of performing an evaluation and management service by telephone and the availability of in person appointments. I also discussed with the patient that there may be a patient responsible charge related to this service. The patient expressed understanding and agreed to proceed.   Patient Location: Home Provider Location: Home Office  History of Present Illness: Patient is evaluated by phone session.  She is doing much better since covert restrictions are eased out.  She is going out more frequently.  She is happy that her husband is working from home but husband has to make decision at the end of this month if he has to go back to [place] or he is thinking to start his own business..  She is sleeping good.  She is taking Klonopin only twice a day.  She like citalopram current dose.  She has no tremors shakes or any EPS.  Her energy level is good.  Her appetite is okay.  Her OCD and anxiety is under control.  She like to keep the current medication.   Past Psychiatric History:Reviewed. H/Oanxiety, OCDand depression.H/O inpatient at [facility] in 2010 for depression and suicidal thoughts. No h/osuicidal attempt. Seroquel cause weightgain, Risperdal caused nausea, Trintellix cause itching, Geodon, Neurontin,Paxiland Abilifydid not help. 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 Wellbutrin caused loss of taste. ECT helped.Recall history of mania and irritability.    Psychiatric Specialty Exam: Physical Exam  Review of Systems  Last menstrual period 12/27/2019.There is no height or weight on file to calculate BMI.  General Appearance: NA  Eye Contact:  NA  Speech:  Slow  Volume:  Decreased  Mood:  Euthymic   Affect:  NA  Thought Process:  Goal Directed  Orientation:  Full (Time, Place, and Person)  Thought Content:  WDL  Suicidal Thoughts:  No  Homicidal Thoughts:  No  Memory:  Immediate;   Good Recent;   Good Remote;   Good  Judgement:  Intact  Insight:  Present  Psychomotor Activity:  NA  Concentration:  Concentration: Fair and Attention Span: Fair  Recall:  Good  Fund of Knowledge:  Good  Language:  Good  Akathisia:  No  Handed:  Right  AIMS (if indicated):     Assets:  Communication Skills Desire for Improvement Housing Resilience Social Support  ADL's:  Intact  Cognition:  WNL  Sleep:   ok      Assessment and Plan: Major depressive disorder, recurrent.  OCD.  Patient doing better on Celexa 40 mg and Klonopin 0.5 mg twice a day.  Discussed medication side effects and benefits.  Recommended to call us back if she has any question or any concern.  Follow-up in 3 months.     Follow Up Instructions:    I discussed the assessment and treatment plan with the patient. The patient was provided an opportunity to ask questions and all were answered. The patient agreed with the plan and demonstrated an understanding of the instructions.   The patient was advised to call back or seek an in-person evaluation if the symptoms worsen or if the condition fails to improve as anticipated.  I provided 15 minutes of non-face-to-face time during this encounter.   Cleotis Nipper, MD

## 2020-01-03 IMAGING — MG DIGITAL SCREENING BILATERAL MAMMOGRAM WITH TOMO AND CAD
8 series · 9 of 24 positions shown · non-contrast
Comparison: None.

CLINICAL DATA: Screening.

EXAM:
DIGITAL SCREENING BILATERAL MAMMOGRAM WITH TOMO AND CAD

[R CC synth-2D]
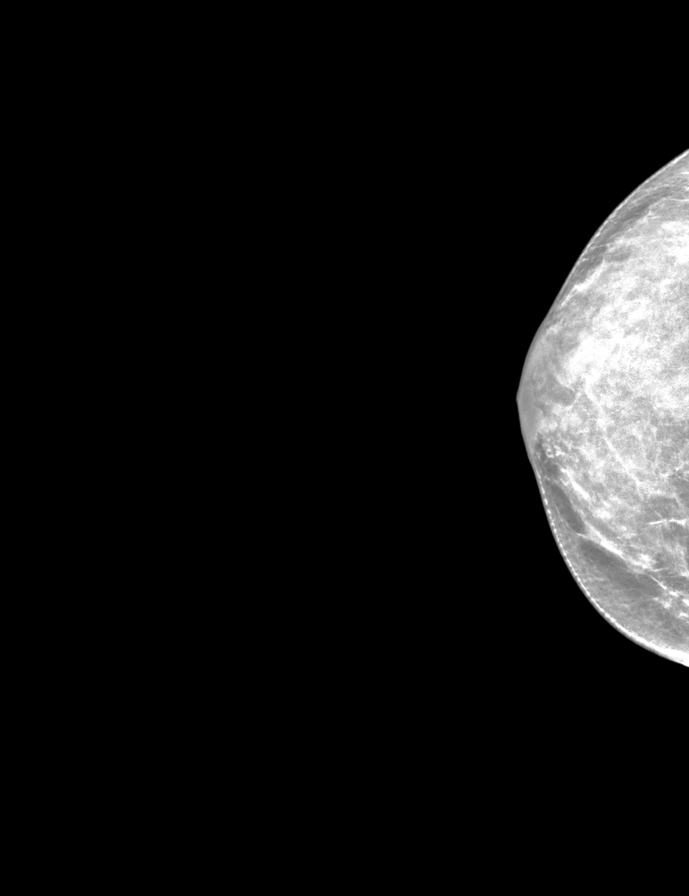

[L CC synth-2D]
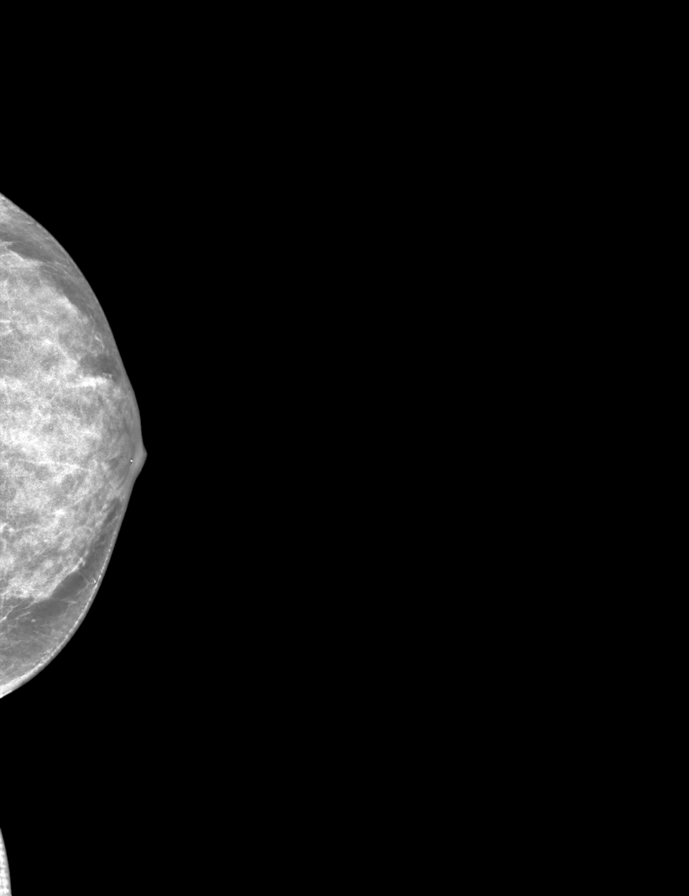

[L MLO synth-2D]
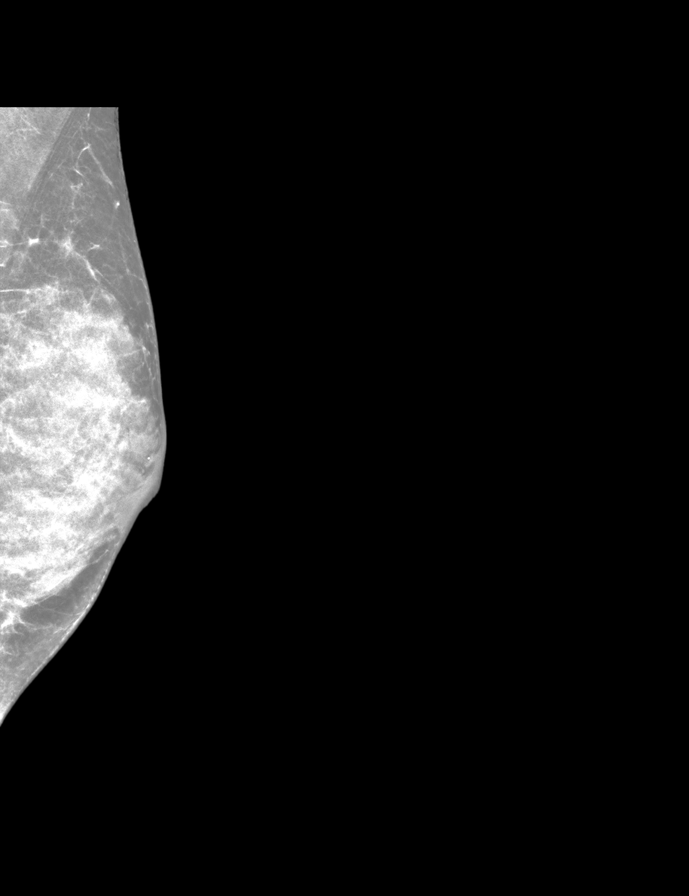

[R MLO synth-2D]
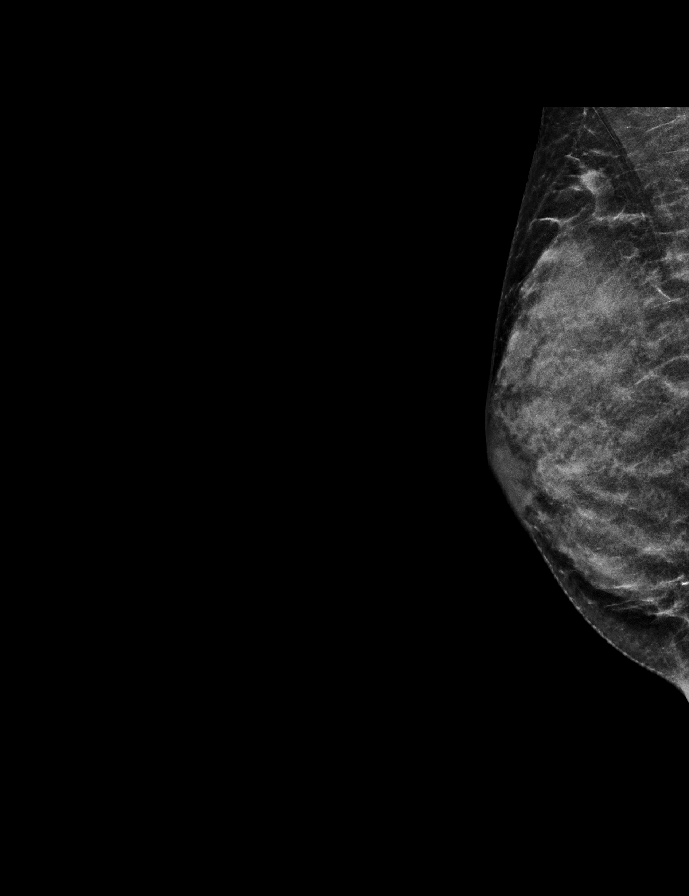

[R MLO tomo · 2 of 41 frames shown]
[frame 14/41]
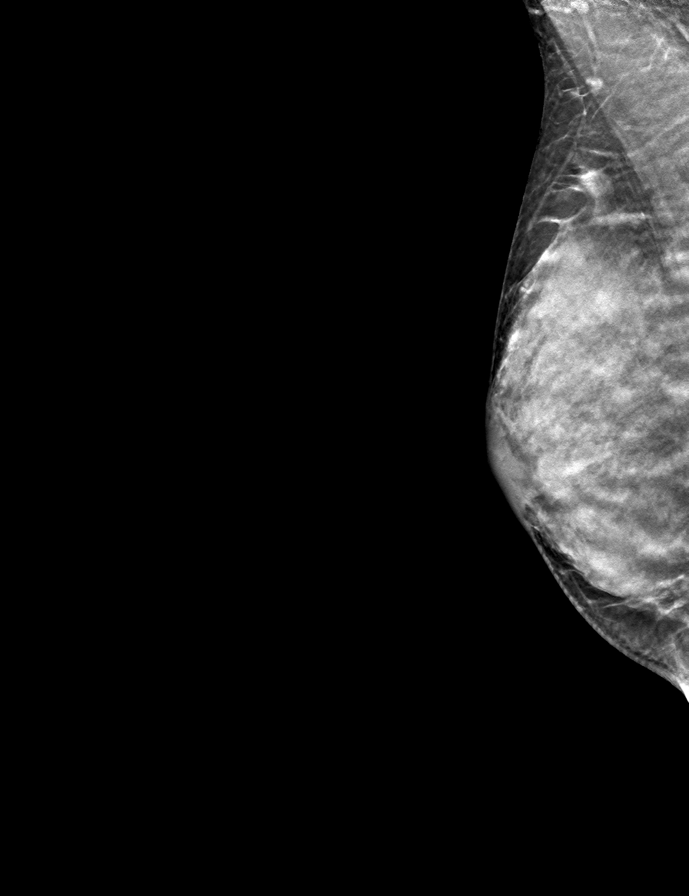
[frame 21/41]
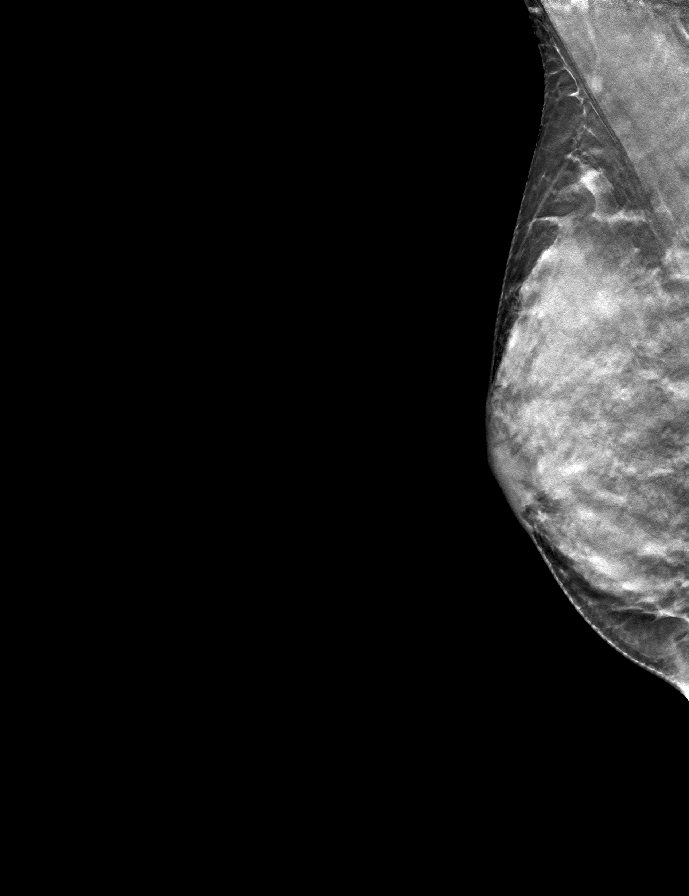

[L CC tomo · tomo slice 23/45.0]
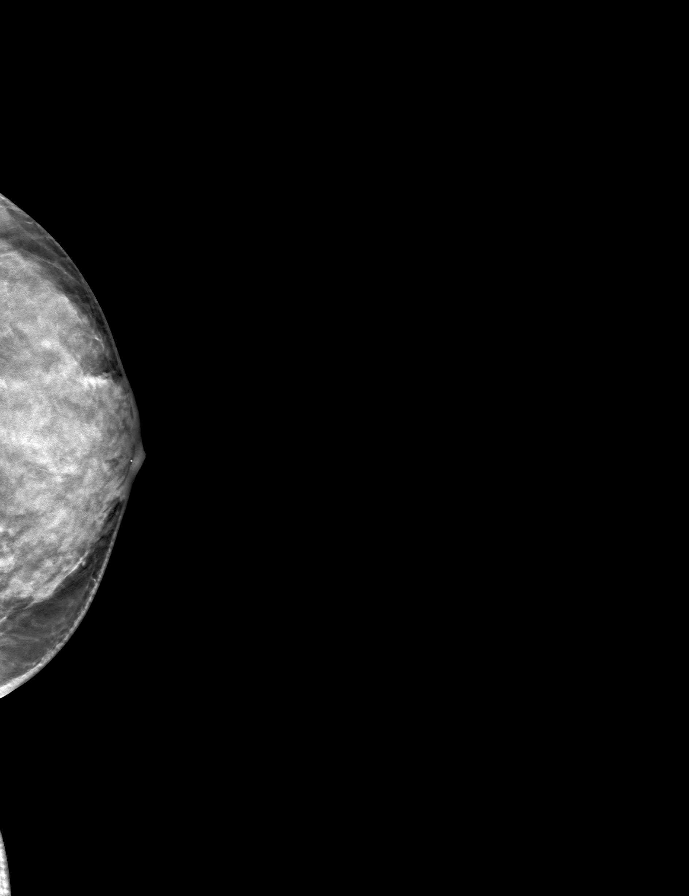

[R CC tomo · tomo slice 23/46.0]
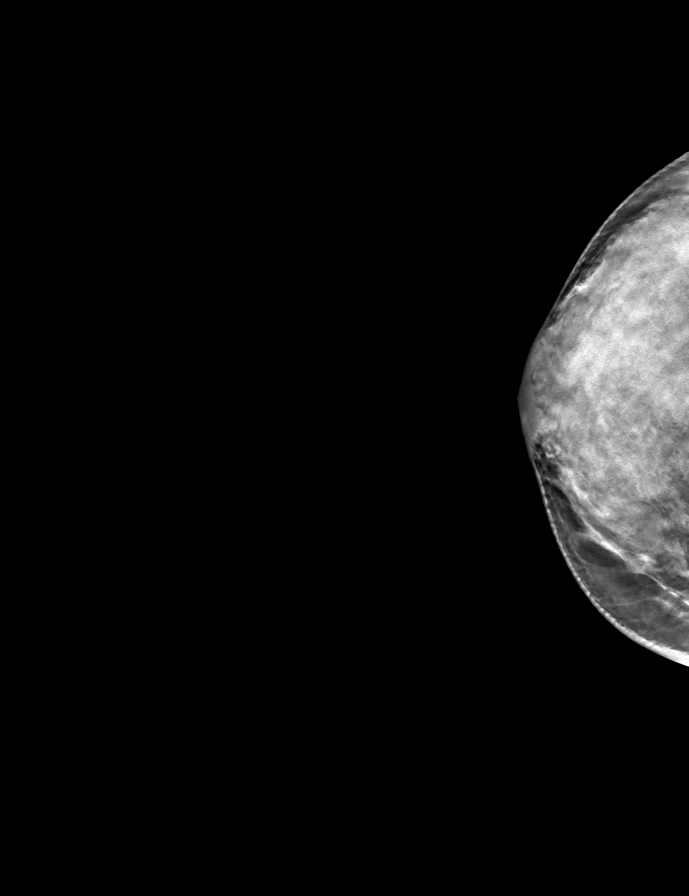

[L MLO tomo · tomo slice 21/41.0]
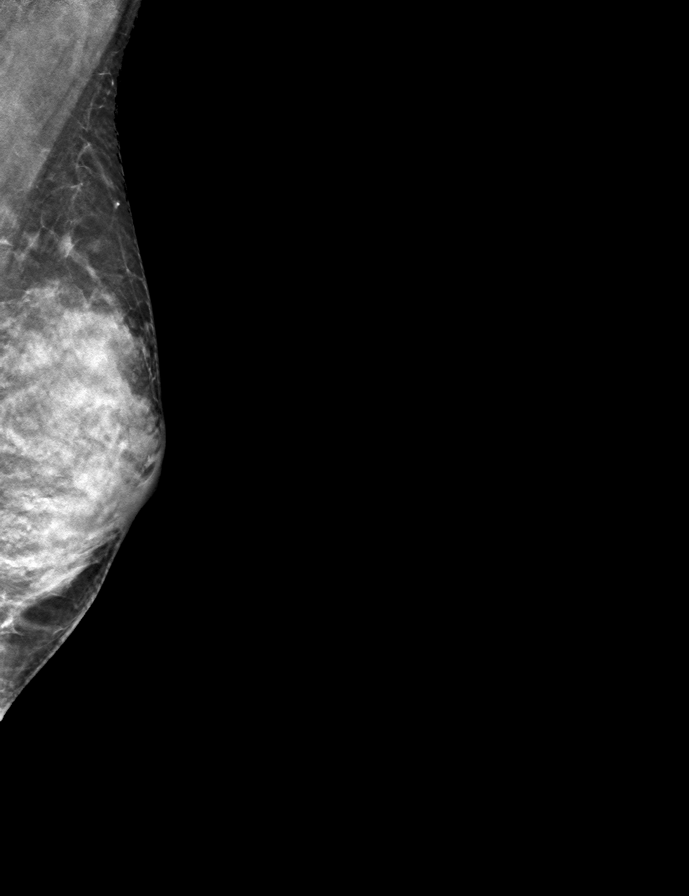

[9 of 24 positions shown; findings below may reference images not displayed]

ACR Breast Density Category c: The breast tissue is heterogeneously
dense, which may obscure small masses
FINDINGS: There are no findings suspicious for malignancy. Images were
processed with CAD.
IMPRESSION: No mammographic evidence of malignancy. A result letter of this
screening mammogram will be mailed directly to the patient.

RECOMMENDATION:
Screening mammogram in one year. (Code:EM-2-IHY)

BI-RADS CATEGORY  1: Negative.

## 2020-01-25 ENCOUNTER — Encounter: Payer: Self-pay | Admitting: Family Medicine

## 2020-01-25 ENCOUNTER — Ambulatory Visit (INDEPENDENT_AMBULATORY_CARE_PROVIDER_SITE_OTHER): Payer: 59 | Admitting: Family Medicine

## 2020-01-25 ENCOUNTER — Other Ambulatory Visit: Payer: Self-pay

## 2020-01-25 VITALS — BP 113/76 | HR 74 | Temp 97.8°F | Resp 16 | Ht 61.75 in | Wt 97.5 lb

## 2020-01-25 DIAGNOSIS — Z130 Encounter for screening for diseases of the blood and blood-forming organs and certain disorders involving the immune mechanism: Secondary | ICD-10-CM

## 2020-01-25 DIAGNOSIS — Z79899 Other long term (current) drug therapy: Secondary | ICD-10-CM | POA: Diagnosis not present

## 2020-01-25 DIAGNOSIS — Z13220 Encounter for screening for lipoid disorders: Secondary | ICD-10-CM

## 2020-01-25 DIAGNOSIS — Z131 Encounter for screening for diabetes mellitus: Secondary | ICD-10-CM

## 2020-01-25 DIAGNOSIS — Z0000 Encounter for general adult medical examination without abnormal findings: Secondary | ICD-10-CM | POA: Diagnosis not present

## 2020-01-25 DIAGNOSIS — L989 Disorder of the skin and subcutaneous tissue, unspecified: Secondary | ICD-10-CM

## 2020-01-25 LAB — CBC
HCT: 38.7 % (ref 36.0–46.0)
Hemoglobin: 13.2 g/dL (ref 12.0–15.0)
MCHC: 34.1 g/dL (ref 30.0–36.0)
MCV: 89.4 fl (ref 78.0–100.0)
Platelets: 317 10*3/uL (ref 150.0–400.0)
RBC: 4.33 Mil/uL (ref 3.87–5.11)
RDW: 13.2 % (ref 11.5–15.5)
WBC: 4.1 10*3/uL (ref 4.0–10.5)

## 2020-01-25 LAB — COMPREHENSIVE METABOLIC PANEL
ALT: 13 U/L (ref 0–35)
AST: 16 U/L (ref 0–37)
Albumin: 3.9 g/dL (ref 3.5–5.2)
Alkaline Phosphatase: 65 U/L (ref 39–117)
BUN: 10 mg/dL (ref 6–23)
CO2: 28 mEq/L (ref 19–32)
Calcium: 8.5 mg/dL (ref 8.4–10.5)
Chloride: 102 mEq/L (ref 96–112)
Creatinine, Ser: 0.69 mg/dL (ref 0.40–1.20)
GFR: 90.9 mL/min (ref >60.00)
Glucose, Bld: 71 mg/dL (ref 70–99)
Potassium: 3.8 mEq/L (ref 3.5–5.1)
Sodium: 137 mEq/L (ref 135–145)
Total Bilirubin: 0.5 mg/dL (ref 0.2–1.2)
Total Protein: 6.2 g/dL (ref 6.0–8.3)

## 2020-01-25 LAB — LIPID PANEL
Cholesterol: 164 mg/dL (ref 0–200)
HDL: 51.8 mg/dL (ref >39.00)
LDL Cholesterol: 100 mg/dL — ABNORMAL HIGH (ref 0–99)
NonHDL: 112.02
Total CHOL/HDL Ratio: 3
Triglycerides: 59 mg/dL (ref 0.0–149.0)
VLDL: 11.8 mg/dL (ref 0.0–40.0)

## 2020-01-25 LAB — HEMOGLOBIN A1C: Hgb A1c MFr Bld: 5.1 % (ref 4.6–6.5)

## 2020-01-25 NOTE — Progress Notes (Signed)
This visit occurred during the SARS-CoV-2 public health emergency.  Safety protocols were in place, including screening questions prior to the visit, additional usage of staff PPE, and extensive cleaning of exam room while observing appropriate contact time as indicated for disinfecting solutions.    Patient ID: Anne Hayden, female  DOB: 09-Dec-1970, 49 y.o.   MRN: [id number] Patient Care Team    Relationship Specialty Notifications Start End  Natalia Leatherwood, DO PCP - General Family Medicine  01/26/18   Cleotis Nipper, MD Consulting Physician Psychiatry  01/27/18   Charlott Rakes, MD Consulting Physician Gastroenterology  01/25/20   Cherlyn Roberts, MD Referring Physician Dermatology  01/25/20   Genia Del, MD Consulting Physician Obstetrics and Gynecology  01/25/20     Chief Complaint  Patient presents with   Annual Exam    Fasting. Pap 07/19/2018. Mammo 12/27/19. Pt went to dentist and said to get PCP to check lump in throat, at thyroid     Subjective:  Anne Hayden is a 49 y.o.  Female  present for CPE. All past medical history, surgical history, allergies, family history, immunizations, medications and social history were updated in the electronic medical record today. All recent labs, ED visits and hospitalizations within the last year were reviewed.  Health maintenance:  Colonoscopy: Patient reports colonoscopy completed last year around August 2020 by Dr. Myrtie Cruise [facility]> records requested Mammogram: completed: 12/27/2019-breast center in [place] Cervical cancer screening: last pap: 07/19/2018 Dr. Acey Lav Immunizations: tdap UTD 07/2015, Influenza UTD 2020 (encouraged yearly).  Covid series completed Infectious disease screening: HIV completed 2017, Hep C patient declined screening today. DEXA: routine screen.  Assistive device: none Oxygen TMH:DQQI Patient has a Dental home. Hospitalizations/ED visits: reviewed.   Depression screen [facility] 2/9  01/25/2020 01/26/2018  Decreased Interest 0 0  Down, Depressed, Hopeless 1 2  PHQ - 2 Score 1 2  Altered sleeping 0 1  Tired, decreased energy 0 1  Change in appetite 0 0  Feeling bad or failure about yourself  0 2  Trouble concentrating 3 1  Moving slowly or fidgety/restless 0 0  Suicidal thoughts 0 0  PHQ-9 Score 4 7  Difficult doing work/chores Not difficult at all -   GAD 7 : Generalized Anxiety Score 01/25/2020  Nervous, Anxious, on Edge 3  Control/stop worrying 0  Worry too much - different things 0  Trouble relaxing 1  Restless 0  Easily annoyed or irritable 1  Afraid - awful might happen 0  Total GAD 7 Score 5  Anxiety Difficulty Not difficult at all   Immunization History  Administered Date(s) Administered   Influenza,inj,quad, With Preservative 05/13/2017   Moderna SARS-COVID-2 Vaccination 10/08/2019, 11/05/2019   Past Medical History:  Diagnosis Date   Allergy    Depression with anxiety    est. with psychiatry, Dr. [name]. In the past she reports she has been told she was bipolar and OCD   OCD (obsessive compulsive disorder)    Osteoarthritis    Vitamin D deficiency    No Known Allergies Past Surgical History:  Procedure Laterality Date   CERVICAL BIOPSY  10/08/2016   CIN I-low grade   COLPOSCOPY  2004,2005,2012,2018   TOE SURGERY Right    Family History  Problem Relation Age of Onset   Arthritis Mother    Hypertension Mother    Heart disease Father    Stroke Father    Polycythemia Father    Breast cancer Sister 13   Breast cancer Maternal  Grandmother 70   Melanoma Maternal Grandfather    COPD Maternal Grandfather    Cancer Maternal Grandfather        melanoma   Diabetes Paternal Grandmother    Heart disease Paternal Grandfather    Bipolar disorder Paternal Uncle    Social History   Social History Narrative   Married. Husband lives in [place] through the week. She recently moved to [place] to be closer to her family.    Masters  of Arts degree. Unemployed.     Allergies as of 01/25/2020   No Known Allergies     Medication List       Accurate as of January 25, 2020  2:25 PM. If you have any questions, ask your nurse or doctor.        STOP taking these medications   omeprazole 20 MG capsule Commonly known as: PRILOSEC Stopped by: Felix Pacini, DO     TAKE these medications   Balziva 0.4-35 MG-MCG tablet Generic drug: norethindrone-ethinyl estradiol Take 1 tablet by mouth daily.   citalopram 40 MG tablet Commonly known as: CELEXA Take 1 tablet (40 mg total) by mouth daily.   clindamycin 1 % lotion Commonly known as: CLEOCIN T clindamycin 1 % lotion   clonazePAM 0.5 MG tablet Commonly known as: KLONOPIN Use twice daily as needed for aanxiety   fluticasone 50 MCG/ACT nasal spray Commonly known as: FLONASE Place 2 sprays into both nostrils daily.   ketoconazole 2 % shampoo Commonly known as: NIZORAL ketoconazole 2 % shampoo   PreviDent 5000 Sensitive 1.1-5 % Pste Generic drug: Sod Fluoride-Potassium Nitrate PreviDent 5000 Sensitive 1.1 %-5 % dental paste   tretinoin 0.1 % cream Commonly known as: RETIN-A tretinoin 0.1 % topical cream  APPLY A PEA SIZE AMOUNT TO FULL FACE AT BEDTIME   VITAMIN D PO Take by mouth.       All past medical history, surgical history, allergies, family history, immunizations andmedications were updated in the EMR today and reviewed under the history and medication portions of their EMR.     No results found for this or any previous visit (from the past 2160 hour(s)).   ROS: 14 pt review of systems performed and negative (unless mentioned in an HPI)  Objective: BP 113/76 (BP Location: Left Arm, Patient Position: Sitting, Cuff Size: Normal)    Pulse 74    Temp 97.8 F (36.6 C) (Temporal)    Resp 16    Ht 5' 1.75" (1.568 m)    Wt 97 lb 8 oz (44.2 kg)    LMP 12/26/2019 (Exact Date)    SpO2 97%    BMI 17.98 kg/m  Gen: Afebrile. No acute distress. Nontoxic in  appearance, well-developed, well-nourished, pleasant female HENT: AT. [place]. Bilateral TM visualized and normal in appearance, normal external auditory canal. MMM, no oral lesions, adequate dentition. Bilateral nares within normal limits. Throat without erythema, ulcerations or exudates.  No cough on exam, no hoarseness on exam. Eyes:Pupils Equal Round Reactive to light, Extraocular movements intact,  Conjunctiva without redness, discharge or icterus. Neck/lymp/endocrine: Supple, no lymphadenopathy, no thyromegaly CV: RRR no murmur, no edema, +2/4 P posterior tibialis pulses.  Chest: CTAB, no wheeze, rhonchi or crackles.  Normal respiratory effort.  Good air movement. Abd: Soft.  Flat. NTND. BS present.  No masses palpated. No hepatosplenomegaly. No rebound tenderness or guarding. Skin: Very small subcutaneous cystic-like lesion ~1 mm left anterior neck.  No redness or drainage.  No tenderness.  No rashes, purpura or petechiae.  Warm and well-perfused. Skin intact. Neuro/Msk:  Normal gait. PERLA. EOMi. Alert. Oriented x3.  Cranial nerves II through XII intact. Muscle strength 5/5 upper/lower extremity. DTRs equal bilaterally. Psych: Normal affect, dress and demeanor. Normal speech. Normal thought content and judgment.  No exam data present  Assessment/plan: ENYA BUREAU is a 49 y.o. female present for CPE  Screening for deficiency anemia - CBC Encounter for long-term current use of medication - Comprehensive metabolic panel Diabetes mellitus screening - Hemoglobin A1c Screening cholesterol level - Lipid panel Skin lesion Very small cystic-like skin lesion less than 1 mm on anterior neck.  Does not appear concerning.  She has an upcoming dermatology appointment scheduled.  She will discuss with them.  Encounter for preventative adult health care examination Patient was encouraged to exercise greater than 150 minutes a week. Patient was encouraged to choose a diet filled with fresh fruits  and vegetables, and lean meats. AVS provided to patient today for education/recommendation on gender specific health and safety maintenance. Colonoscopy: Patient reports colonoscopy completed last year around August 2020 by Dr. Myrtie Cruise GI> records requested Mammogram: completed: 12/27/2019-breast center in [place] Cervical cancer screening: last pap: 07/19/2018 Dr. Acey Lav Immunizations: tdap UTD 07/2015, Influenza UTD 2020 (encouraged yearly).  Covid series completed Infectious disease screening: HIV completed 2017, Hep C patient declined screening today. DEXA: routine screen.   Return in about 1 year (around 01/24/2021) for CPE (30 min).   Orders Placed This Encounter  Procedures   CBC   Comprehensive metabolic panel   Hemoglobin A1c   Lipid panel   No orders of the defined types were placed in this encounter.  Referral Orders  No referral(s) requested today     Electronically signed by: Felix Pacini, DO [facility]

## 2020-01-25 NOTE — Patient Instructions (Signed)
Health Maintenance, Female Adopting a healthy lifestyle and getting preventive care are important in promoting health and wellness. Ask your health care provider about:  The right schedule for you to have regular tests and exams.  Things you can do on your own to prevent diseases and keep yourself healthy. What should I know about diet, weight, and exercise? Eat a healthy diet   Eat a diet that includes plenty of vegetables, fruits, low-fat dairy products, and lean protein.  Do not eat a lot of foods that are high in solid fats, added sugars, or sodium. Maintain a healthy weight Body mass index (BMI) is used to identify weight problems. It estimates body fat based on height and weight. Your health care provider can help determine your BMI and help you achieve or maintain a healthy weight. Get regular exercise Get regular exercise. This is one of the most important things you can do for your health. Most adults should:  Exercise for at least 150 minutes each week. The exercise should increase your heart rate and make you sweat (moderate-intensity exercise).  Do strengthening exercises at least twice a week. This is in addition to the moderate-intensity exercise.  Spend less time sitting. Even light physical activity can be beneficial. Watch cholesterol and blood lipids Have your blood tested for lipids and cholesterol at 48 years of age, then have this test every 5 years. Have your cholesterol levels checked more often if:  Your lipid or cholesterol levels are high.  You are older than 48 years of age.  You are at high risk for heart disease. What should I know about cancer screening? Depending on your health history and family history, you may need to have cancer screening at various ages. This may include screening for:  Breast cancer.  Cervical cancer.  Colorectal cancer.  Skin cancer.  Lung cancer. What should I know about heart disease, diabetes, and high blood  pressure? Blood pressure and heart disease  High blood pressure causes heart disease and increases the risk of stroke. This is more likely to develop in people who have high blood pressure readings, are of African descent, or are overweight.  Have your blood pressure checked: ? Every 3-5 years if you are 18-39 years of age. ? Every year if you are 40 years old or older. Diabetes Have regular diabetes screenings. This checks your fasting blood sugar level. Have the screening done:  Once every three years after age 40 if you are at a normal weight and have a low risk for diabetes.  More often and at a younger age if you are overweight or have a high risk for diabetes. What should I know about preventing infection? Hepatitis B If you have a higher risk for hepatitis B, you should be screened for this virus. Talk with your health care provider to find out if you are at risk for hepatitis B infection. Hepatitis C Testing is recommended for:  Everyone born from 1945 through 1965.  Anyone with known risk factors for hepatitis C. Sexually transmitted infections (STIs)  Get screened for STIs, including gonorrhea and chlamydia, if: ? You are sexually active and are younger than 48 years of age. ? You are older than 48 years of age and your health care provider tells you that you are at risk for this type of infection. ? Your sexual activity has changed since you were last screened, and you are at increased risk for chlamydia or gonorrhea. Ask your health care provider if   you are at risk.  Ask your health care provider about whether you are at high risk for HIV. Your health care provider may recommend a prescription medicine to help prevent HIV infection. If you choose to take medicine to prevent HIV, you should first get tested for HIV. You should then be tested every 3 months for as long as you are taking the medicine. Pregnancy  If you are about to stop having your period (premenopausal) and  you may become pregnant, seek counseling before you get pregnant.  Take 400 to 800 micrograms (mcg) of folic acid every day if you become pregnant.  Ask for birth control (contraception) if you want to prevent pregnancy. Osteoporosis and menopause Osteoporosis is a disease in which the bones lose minerals and strength with aging. This can result in bone fractures. If you are 65 years old or older, or if you are at risk for osteoporosis and fractures, ask your health care provider if you should:  Be screened for bone loss.  Take a calcium or vitamin D supplement to lower your risk of fractures.  Be given hormone replacement therapy (HRT) to treat symptoms of menopause. Follow these instructions at home: Lifestyle  Do not use any products that contain nicotine or tobacco, such as cigarettes, e-cigarettes, and chewing tobacco. If you need help quitting, ask your health care provider.  Do not use street drugs.  Do not share needles.  Ask your health care provider for help if you need support or information about quitting drugs. Alcohol use  Do not drink alcohol if: ? Your health care provider tells you not to drink. ? You are pregnant, may be pregnant, or are planning to become pregnant.  If you drink alcohol: ? Limit how much you use to 0-1 drink a day. ? Limit intake if you are breastfeeding.  Be aware of how much alcohol is in your drink. In the [place] one drink equals one 12 oz bottle of beer (355 mL), one 5 oz glass of wine (148 mL), or one 1 oz glass of hard liquor (44 mL). General instructions  Schedule regular health, dental, and eye exams.  Stay current with your vaccines.  Tell your health care provider if: ? You often feel depressed. ? You have ever been abused or do not feel safe at home. Summary  Adopting a healthy lifestyle and getting preventive care are important in promoting health and wellness.  Follow your health care provider's instructions about healthy  diet, exercising, and getting tested or screened for diseases.  Follow your health care provider's instructions on monitoring your cholesterol and blood pressure. This information is not intended to replace advice given to you by your health care provider. Make sure you discuss any questions you have with your health care provider. Document Revised: 06/22/2018 Document Reviewed: 06/22/2018 Elsevier Patient Education  2020 Elsevier Inc.  

## 2020-02-09 ENCOUNTER — Encounter: Payer: Self-pay | Admitting: Family Medicine

## 2020-04-02 ENCOUNTER — Other Ambulatory Visit: Payer: Self-pay

## 2020-04-02 ENCOUNTER — Encounter (HOSPITAL_COMMUNITY): Payer: Self-pay | Admitting: Psychiatry

## 2020-04-02 ENCOUNTER — Telehealth (INDEPENDENT_AMBULATORY_CARE_PROVIDER_SITE_OTHER): Payer: 59 | Admitting: Psychiatry

## 2020-04-02 VITALS — Wt 97.0 lb

## 2020-04-02 DIAGNOSIS — F331 Major depressive disorder, recurrent, moderate: Secondary | ICD-10-CM

## 2020-04-02 DIAGNOSIS — F429 Obsessive-compulsive disorder, unspecified: Secondary | ICD-10-CM

## 2020-04-02 MED ORDER — CLONAZEPAM 0.5 MG PO TABS: ORAL_TABLET | ORAL | 2 refills | Status: DC

## 2020-04-02 MED ORDER — CITALOPRAM HYDROBROMIDE 40 MG PO TABS: 40.0000 mg | ORAL_TABLET | Freq: Every day | ORAL | 2 refills | Status: DC

## 2020-04-02 NOTE — Progress Notes (Signed)
Virtual Visit via Telephone Note  I connected with Anne Hayden on 04/02/20 at  2:40 PM EDT by telephone and verified that I am speaking with the correct person using two identifiers.  Location: Patient: home Provider: home office   I discussed the limitations, risks, security and privacy concerns of performing an evaluation and management service by telephone and the availability of in person appointments. I also discussed with the patient that there may be a patient responsible charge related to this service. The patient expressed understanding and agreed to proceed.   History of Present Illness: Patient is evaluated by phone session.  She is sad because husband is going back to [place] on for Saturday.  Though she understand that 1 day he has to go back to work but she was hoping 3 more days he can the state.  Last week her cat died and that also makes her very sad.  But she feels overall medicine keeping her stable.  She denies any crying spells, suicidal thoughts.  She does have some time obsession but they are not as bad.  She sleeps mostly 8 hours a day.  When she is sad then she sleeps 10 hours.  She denies any feeling of hopelessness or worthlessness.  Her mother lives with her.  She is hoping 1 day her husband will start his own business and then they can stay together.  Patient recently had blood work.  She has a normal CBC, chemistry and hemoglobin A1c.  She denies drinking or using any illegal substances.  Her appetite and weight remains unchanged from past few years.    Past Psychiatric History: H/Oanxiety, OCDand depression.H/[facility] inpatient [facility] 2060fordepression and suicidal thoughts. No h/osuicidal attempt. Seroquel cause weightgain, Risperdal caused nausea, Trintellix cause itching, Geodon, Neurontin,Paxiland Abilifydid not help. [place]Wellbutrin caused loss of taste.ECT helped.Recall history of mania  and irritability.  Psychiatric Specialty Exam: Physical Exam  Review of Systems  Weight 97 lb (44 kg).There is no height or weight on file to calculate BMI.  General Appearance: NA  Eye Contact:  NA  Speech:  Slow  Volume:  Decreased  Mood:  Anxious  Affect:  NA  Thought Process:  Goal Directed  Orientation:  Full (Time, Place, and Person)  Thought Content:  WDL  Suicidal Thoughts:  No  Homicidal Thoughts:  No  Memory:  Immediate;   Good Recent;   Good Remote;   Good  Judgement:  Intact  Insight:  Present  Psychomotor Activity:  NA  Concentration:  Concentration: Fair and Attention Span: Fair  Recall:  Good  Fund of Knowledge:  Good  Language:  Good  Akathisia:  No  Handed:  Right  AIMS (if indicated):     Assets:  Communication Skills Desire for Improvement Housing Resilience Social Support  ADL's:  Intact  Cognition:  WNL  Sleep:   8 hrs      Assessment and Plan: Major depressive disorder, recurrent.  OCD.  Patient is a stable on her current medication.  She does not want to change.  She is sad since her husband is going back to work to [place].  She is hoping to visit him in October.  Continue Celexa 40 mg daily, Klonopin 0.5 mg twice a day.  I reviewed blood work results.  Recommended to call us back if she has any question or any concern.  Follow-up in 3 months.  Follow Up Instructions:    I discussed the  assessment and treatment plan with the patient. The patient was provided an opportunity to ask questions and all were answered. The patient agreed with the plan and demonstrated an understanding of the instructions.   The patient was advised to call back or seek an in-person evaluation if the symptoms worsen or if the condition fails to improve as anticipated.  I provided 14 minutes of non-face-to-face time during this encounter.   Cleotis Nipper, MD

## 2020-04-24 ENCOUNTER — Telehealth (HOSPITAL_COMMUNITY): Payer: Self-pay | Admitting: *Deleted

## 2020-04-24 DIAGNOSIS — F429 Obsessive-compulsive disorder, unspecified: Secondary | ICD-10-CM

## 2020-04-24 MED ORDER — CLONAZEPAM 0.5 MG PO TABS: ORAL_TABLET | ORAL | 2 refills | Status: DC

## 2020-04-24 NOTE — Telephone Encounter (Signed)
Done

## 2020-04-24 NOTE — Telephone Encounter (Signed)
Pt called requesting Klonopin be increased back to #75. Pt has an upcoming appointment on 07/02/20. Please review and advise.

## 2020-06-26 ENCOUNTER — Other Ambulatory Visit (HOSPITAL_COMMUNITY): Payer: Self-pay | Admitting: *Deleted

## 2020-06-26 DIAGNOSIS — F429 Obsessive-compulsive disorder, unspecified: Secondary | ICD-10-CM

## 2020-06-26 MED ORDER — CITALOPRAM HYDROBROMIDE 40 MG PO TABS: 40.0000 mg | ORAL_TABLET | Freq: Every day | ORAL | 0 refills | Status: DC

## 2020-06-28 ENCOUNTER — Telehealth (HOSPITAL_COMMUNITY): Payer: 59 | Admitting: Psychiatry

## 2020-07-02 ENCOUNTER — Telehealth (HOSPITAL_COMMUNITY): Payer: 59 | Admitting: Psychiatry

## 2020-07-17 ENCOUNTER — Other Ambulatory Visit: Payer: Self-pay

## 2020-07-17 ENCOUNTER — Encounter (HOSPITAL_COMMUNITY): Payer: Self-pay | Admitting: Psychiatry

## 2020-07-17 ENCOUNTER — Telehealth (INDEPENDENT_AMBULATORY_CARE_PROVIDER_SITE_OTHER): Payer: 59 | Admitting: Psychiatry

## 2020-07-17 VITALS — Wt 97.0 lb

## 2020-07-17 DIAGNOSIS — F331 Major depressive disorder, recurrent, moderate: Secondary | ICD-10-CM

## 2020-07-17 DIAGNOSIS — F429 Obsessive-compulsive disorder, unspecified: Secondary | ICD-10-CM | POA: Diagnosis not present

## 2020-07-17 MED ORDER — CLONAZEPAM 0.5 MG PO TABS: ORAL_TABLET | ORAL | 2 refills | Status: DC

## 2020-07-17 MED ORDER — CITALOPRAM HYDROBROMIDE 40 MG PO TABS: 40.0000 mg | ORAL_TABLET | Freq: Every day | ORAL | 2 refills | Status: DC

## 2020-07-17 NOTE — Progress Notes (Signed)
Virtual Visit via Telephone Note  I connected with Anne Hayden on 07/17/20 at  9:40 AM EST by telephone and verified that I am speaking with the correct person using two identifiers.  Location: Patient: Home Provider: Home Office   I discussed the limitations, risks, security and privacy concerns of performing an evaluation and management service by telephone and the availability of in person appointments. I also discussed with the patient that there may be a patient responsible charge related to this service. The patient expressed understanding and agreed to proceed.   History of Present Illness: Patient is evaluated by phone session.  She is on the phone by herself.  She reported had a good time recently since husband visited from [place].  Patient told they also went to [place] around Thanksgiving for 3 days.  Patient told original plan was to visit her husband's family in [place] but due to lockdown they could not go and stay in [place] for 3 days.  Overall patient feels things are going well.  Sometimes she gets very anxious and nervous and took tired Klonopin.  However it rarely happens.  She feels her OCD and depression is a stable.  She denies any crying spells, feeling of hopelessness or worthlessness.  Her energy level is fair.  Her appetite is okay.  Her weight is stable.  She is hoping to visit [place] in February to her husband.  Patient told the our long-term plan is husband low start his own business so they can stay together.  Patient lives with her mother who is very supportive.  She has no tremors, shakes or any EPS.  She like to keep her current medication since it is working good.  Past Psychiatric History: H/Oanxiety, OCDand depression.H/O inpatient [facility] in 2022fordepression and suicidal thoughts. No h/osuicidal attempt. Seroquel cause weightgain, Risperdal caused nausea, Trintellix cause itching, Geodon, Neurontin,Paxiland Abilifydid not help.  [place].Wellbutrin caused loss of taste.ECT helped.Recall history of mania and irritability.   Psychiatric Specialty Exam: Physical Exam  Review of Systems  Weight 97 lb (44 kg).There is no height or weight on file to calculate BMI.  General Appearance: NA  Eye Contact:  NA  Speech:  Slow  Volume:  Decreased  Mood:  Anxious  Affect:  NA  Thought Process:  Descriptions of Associations: Intact  Orientation:  Full (Time, Place, and Person)  Thought Content:  Rumination  Suicidal Thoughts:  No  Homicidal Thoughts:  No  Memory:  Immediate;   Good Recent;   Good Remote;   Fair  Judgement:  Intact  Insight:  Present  Psychomotor Activity:  NA  Concentration:  Concentration: Fair and Attention Span: Fair  Recall:  Good  Fund of Knowledge:  Good  Language:  Good  Akathisia:  No  Handed:  Right  AIMS (if indicated):     Assets:  Communication Skills Desire for Improvement Housing Social Support  ADL's:  Intact  Cognition:  WNL  Sleep:   8 hrs      Assessment and Plan: Obsessive-compulsive disorder.  Major depressive disorder, recurrent.  Patient is a stable on her current medication.  Continue Celexa 40 mg daily and Klonopin 0.5 mg twice a day and third in rare cases when she is very nervous and anxious.  Discussed medication side effect specially benzodiazepine dependence, tolerance and withdrawal.  Recommended to call us back if she is any question or any concern.  Follow-up in 3 months.  Follow Up  Instructions:    I discussed the assessment and treatment plan with the patient. The patient was provided an opportunity to ask questions and all were answered. The patient agreed with the plan and demonstrated an understanding of the instructions.   The patient was advised to call back or seek an in-person evaluation if the symptoms worsen or if the condition fails to improve as anticipated.  I provided 13 minutes of  non-face-to-face time during this encounter.   Cleotis Nipper, MD

## 2020-08-21 ENCOUNTER — Other Ambulatory Visit (HOSPITAL_COMMUNITY): Payer: Self-pay | Admitting: Psychiatry

## 2020-08-21 DIAGNOSIS — F331 Major depressive disorder, recurrent, moderate: Secondary | ICD-10-CM

## 2020-08-21 DIAGNOSIS — F429 Obsessive-compulsive disorder, unspecified: Secondary | ICD-10-CM

## 2020-09-12 ENCOUNTER — Encounter: Payer: 59 | Admitting: Obstetrics & Gynecology

## 2020-10-16 ENCOUNTER — Telehealth (INDEPENDENT_AMBULATORY_CARE_PROVIDER_SITE_OTHER): Payer: 59 | Admitting: Psychiatry

## 2020-10-16 ENCOUNTER — Other Ambulatory Visit: Payer: Self-pay

## 2020-10-16 ENCOUNTER — Encounter (HOSPITAL_COMMUNITY): Payer: Self-pay | Admitting: Psychiatry

## 2020-10-16 DIAGNOSIS — F331 Major depressive disorder, recurrent, moderate: Secondary | ICD-10-CM | POA: Diagnosis not present

## 2020-10-16 DIAGNOSIS — F429 Obsessive-compulsive disorder, unspecified: Secondary | ICD-10-CM | POA: Diagnosis not present

## 2020-10-16 MED ORDER — CLONAZEPAM 0.5 MG PO TABS: ORAL_TABLET | ORAL | 2 refills | Status: DC

## 2020-10-16 MED ORDER — CITALOPRAM HYDROBROMIDE 40 MG PO TABS: 40.0000 mg | ORAL_TABLET | Freq: Every day | ORAL | 2 refills | Status: DC

## 2020-10-16 NOTE — Progress Notes (Signed)
Virtual Visit via Telephone Note  I connected with Anne Hayden on 10/16/20 at 10:00 AM EDT by telephone and verified that I am speaking with the correct person using two identifiers.  Location: Patient: Home Provider: Home Office   I discussed the limitations, risks, security and privacy concerns of performing an evaluation and management service by telephone and the availability of in person appointments. I also discussed with the patient that there may be a patient responsible charge related to this service. The patient expressed understanding and agreed to proceed.   History of Present Illness: Patient is evaluated by phone session.  She is taking citalopram and Klonopin.  She endorsed some time she feel obsessive about certain things and get irritated.  Lately she is thinking too much about religion that which religion is good among other religions.  She also started reading Bible.  She also thinking too much about the antiques and is spent a lot of time analyzing antique things. She reported these obsessive thoughts comes and goes but she denies any paranoia, mania, suicidal thoughts.  She is happy that her husband is coming in 2 weeks.  She does talk to her husband on and off.  She understands the symptoms are chronic and comes and goes.  She lives with her mother who is very supportive.  She is sleeping good.  She has no tremors shakes or any EPS.  She denies any anhedonia or hallucinations.  She reported her appetite is okay and her weight is unchanged from the past.   Past Psychiatric History: H/Oanxiety, OCD, maniaand depression.H/O inpatient [facility] and suicidal thoughts. No h/osuicidal attempt. Seroquel cause weightgain, Risperdal caused nausea, Trintellix cause itching, Geodon, Neurontin,Paxiland Abilifydid not help. [place]Wellbutrin caused loss of taste.ECT helped.  Psychiatric  Specialty Exam: Physical Exam  Review of Systems  Weight 98 lb (44.5 kg).There is no height or weight on file to calculate BMI.  General Appearance: NA  Eye Contact:  NA  Speech:  Slow  Volume:  Decreased  Mood:  Anxious  Affect:  NA  Thought Process:  Descriptions of Associations: Intact  Orientation:  Full (Time, Place, and Person)  Thought Content:  Obsessions and Rumination  Suicidal Thoughts:  No  Homicidal Thoughts:  No  Memory:  Immediate;   Good Recent;   Good Remote;   Fair  Judgement:  Fair  Insight:  Present  Psychomotor Activity:  NA  Concentration:  Concentration: Fair and Attention Span: Fair  Recall:  Good  Fund of Knowledge:  Good  Language:  Good  Akathisia:  No  Handed:  Right  AIMS (if indicated):     Assets:  Communication Skills Desire for Improvement Housing Resilience Transportation  ADL's:  Intact  Cognition:  WNL  Sleep:   ok      Assessment and Plan: Obsessive-compulsive disorder.  Major depressive disorder, recurrent.    Patient does not want to change the medication and like to keep the citalopram 40 mg and Klonopin twice a day and third if needed for severe anxiety.  We talked about therapy to address her chronic obsessive symptoms but patient at this time cannot afford due to high deductible but promised to consider if needed and able to afford.  We discussed medication side effects and benefits.  Discussed benzodiazepine dependence tolerance and withdrawal.  Continue current dose of citalopram and Klonopin.  Recommended to call us back if there is any question or any concern.  Follow-up  in 3 months.  Follow Up Instructions:    I discussed the assessment and treatment plan with the patient. The patient was provided an opportunity to ask questions and all were answered. The patient agreed with the plan and demonstrated an understanding of the instructions.   The patient was advised to call back or seek an in-person evaluation if the  symptoms worsen or if the condition fails to improve as anticipated.  I provided 16 minutes of non-face-to-face time during this encounter.   Cleotis Nipper, MD

## 2020-10-22 ENCOUNTER — Other Ambulatory Visit: Payer: Self-pay | Admitting: Obstetrics & Gynecology

## 2020-10-22 NOTE — Telephone Encounter (Signed)
Medication refill request: OCP  Last AEX:  09-12-19 ML Next AEX: 11-08-20 Last MMG (if hormonal medication request): 12-27-19 density D/BIRADS 1 negative  Refill authorized: Today, please advise.   Medication pended for #28, 0RF. Please refill if appropriate.

## 2020-11-08 ENCOUNTER — Other Ambulatory Visit: Payer: Self-pay

## 2020-11-08 ENCOUNTER — Encounter: Payer: Self-pay | Admitting: Obstetrics & Gynecology

## 2020-11-08 ENCOUNTER — Other Ambulatory Visit (HOSPITAL_COMMUNITY)
Admission: RE | Admit: 2020-11-08 | Discharge: 2020-11-08 | Disposition: A | Discharge status: Home / Self Care | Payer: 59 | Source: Ambulatory Visit | Attending: Obstetrics & Gynecology | Admitting: Obstetrics & Gynecology

## 2020-11-08 ENCOUNTER — Ambulatory Visit (INDEPENDENT_AMBULATORY_CARE_PROVIDER_SITE_OTHER): Payer: 59 | Admitting: Obstetrics & Gynecology

## 2020-11-08 VITALS — BP 108/68 | Ht 61.25 in | Wt 101.0 lb

## 2020-11-08 DIAGNOSIS — A6000 Herpesviral infection of urogenital system, unspecified: Secondary | ICD-10-CM | POA: Diagnosis not present

## 2020-11-08 DIAGNOSIS — Z01419 Encounter for gynecological examination (general) (routine) without abnormal findings: Secondary | ICD-10-CM | POA: Diagnosis present

## 2020-11-08 DIAGNOSIS — Z3041 Encounter for surveillance of contraceptive pills: Secondary | ICD-10-CM | POA: Diagnosis not present

## 2020-11-08 DIAGNOSIS — N870 Mild cervical dysplasia: Secondary | ICD-10-CM | POA: Diagnosis present

## 2020-11-08 MED ORDER — VALACYCLOVIR HCL 1 G PO TABS: 1000.0000 mg | ORAL_TABLET | Freq: Every day | ORAL | 2 refills | Status: AC

## 2020-11-08 MED ORDER — BALZIVA 0.4-35 MG-MCG PO TABS: 1.0000 | ORAL_TABLET | Freq: Every day | ORAL | 4 refills | Status: DC

## 2020-11-08 NOTE — Progress Notes (Signed)
Anne Hayden [facility] 1970-07-28 [id number]   History:    50 y.o. G1P0A1 Married x 8 yrs.   SL:[id number] presenting for annual gyn exam   GOT:LXBW on Balziva.  No BTB. No pelvic pain. Occasionally sexually active, no pain with IC. Urine/BMs normal. Breasts normal. BMI 18.37, stable. Health labs with Fam MD.   Past medical history,surgical history, family history and social history were all reviewed and documented in the EPIC chart.  Gynecologic History No LMP recorded. (Menstrual status: Oral contraceptives).  Obstetric History OB History  Gravida Para Term Preterm AB Living  1 0     1 0  SAB IAB Ectopic Multiple Live Births               # Outcome Date GA Lbr Len/2nd Weight Sex Delivery Anes PTL Lv  1 AB              ROS: A ROS was performed and pertinent positives and negatives are included in the history.  GENERAL: No fevers or chills. HEENT: No change in vision, no earache, sore throat or sinus congestion. NECK: No pain or stiffness. CARDIOVASCULAR: No chest pain or pressure. No palpitations. PULMONARY: No shortness of breath, cough or wheeze. GASTROINTESTINAL: No abdominal pain, nausea, vomiting or diarrhea, melena or bright red blood per rectum. GENITOURINARY: No urinary frequency, urgency, hesitancy or dysuria. MUSCULOSKELETAL: No joint or muscle pain, no back pain, no recent trauma. DERMATOLOGIC: No rash, no itching, no lesions. ENDOCRINE: No polyuria, polydipsia, no heat or cold intolerance. No recent change in weight. HEMATOLOGICAL: No anemia or easy bruising or bleeding. NEUROLOGIC: No headache, seizures, numbness, tingling or weakness. PSYCHIATRIC: No depression, no loss of interest in normal activity or change in sleep pattern.     Exam:   BP 108/68   Ht 5' 1.25" (1.556 m)   Wt 101 lb (45.8 kg)   BMI 18.93 kg/m   Body mass index is 18.93 kg/m.  General appearance : Well developed well nourished female. No acute distress HEENT: Eyes: no retinal  hemorrhage or exudates,  Neck supple, trachea midline, no carotid bruits, no thyroidmegaly Lungs: Clear to auscultation, no rhonchi or wheezes, or rib retractions  Heart: Regular rate and rhythm, no murmurs or gallops Breast:Examined in sitting and supine position were symmetrical in appearance, no palpable masses or tenderness,  no skin retraction, no nipple inversion, no nipple discharge, no skin discoloration, no axillary or supraclavicular lymphadenopathy Abdomen: no palpable masses or tenderness, no rebound or guarding Extremities: no edema or skin discoloration or tenderness  Pelvic: Vulva: Normal             Vagina: No gross lesions or discharge  Cervix: No gross lesions or discharge.  Pap reflex done.  Uterus  AV, normal size, shape and consistency, non-tender and mobile  Adnexa  Without masses or tenderness  Anus: Normal   Assessment/Plan:  50 y.o. female for annual exam   1. Encounter for routine gynecological examination with Papanicolaou smear of cervix Normal gynecologic exam.  Pap reflex done.  Breast exam normal.  Screening mammogram June 2021 was negative.  Good body mass index at 18.93.  Continue with fitness and healthy nutrition.  Health labs with family physician. - Cytology - PAP( [facility])  2. CIN I (cervical intraepithelial neoplasia I) - Cytology - PAP( [place])  3. Encounter for surveillance of contraceptive pills Well on Balziva.  No contraindication to continue.  Prescription sent to pharmacy.  4. Genital herpes simplex, unspecified  site Recurrent genital herpes.  Rare recurrences.  Would like every prescription of valacyclovir.  Prescription sent to pharmacy.  Other orders - norethindrone-ethinyl estradiol (BALZIVA) 0.4-35 MG-MCG tablet; Take 1 tablet by mouth daily. - valACYclovir (VALTREX) 1000 MG tablet; Take 1 tablet (1,000 mg total) by mouth daily for 5 days.  Genia Del MD, 1:47 PM 11/08/2020

## 2020-11-09 ENCOUNTER — Encounter: Payer: Self-pay | Admitting: Obstetrics & Gynecology

## 2020-11-19 ENCOUNTER — Other Ambulatory Visit: Payer: Self-pay | Admitting: Family Medicine

## 2020-11-19 DIAGNOSIS — Z1231 Encounter for screening mammogram for malignant neoplasm of breast: Secondary | ICD-10-CM

## 2020-11-21 ENCOUNTER — Other Ambulatory Visit: Payer: Self-pay | Admitting: Obstetrics & Gynecology

## 2020-12-11 LAB — CYTOLOGY - PAP
Comment: NEGATIVE
Comment: NEGATIVE
Diagnosis: UNDETERMINED — AB
HPV 16: NEGATIVE
HPV 18 / 45: NEGATIVE
High risk HPV: POSITIVE — AB

## 2020-12-23 ENCOUNTER — Encounter: Payer: Self-pay | Admitting: Obstetrics & Gynecology

## 2020-12-23 ENCOUNTER — Other Ambulatory Visit: Payer: Self-pay

## 2020-12-23 ENCOUNTER — Ambulatory Visit (INDEPENDENT_AMBULATORY_CARE_PROVIDER_SITE_OTHER): Payer: 59 | Admitting: Obstetrics & Gynecology

## 2020-12-23 ENCOUNTER — Other Ambulatory Visit (HOSPITAL_COMMUNITY)
Admission: RE | Admit: 2020-12-23 | Discharge: 2020-12-23 | Disposition: A | Discharge status: Home / Self Care | Payer: 59 | Source: Ambulatory Visit | Attending: Obstetrics & Gynecology | Admitting: Obstetrics & Gynecology

## 2020-12-23 VITALS — BP 110/74

## 2020-12-23 DIAGNOSIS — N870 Mild cervical dysplasia: Secondary | ICD-10-CM

## 2020-12-23 DIAGNOSIS — R87610 Atypical squamous cells of undetermined significance on cytologic smear of cervix (ASC-US): Secondary | ICD-10-CM | POA: Diagnosis present

## 2020-12-23 DIAGNOSIS — R87810 Cervical high risk human papillomavirus (HPV) DNA test positive: Secondary | ICD-10-CM

## 2020-12-23 NOTE — Progress Notes (Signed)
    Anne Hayden [facility] [birth year] [id number]        50 y.o.  G1P0010   RP: ASCUS/HPV HR Pos for Colposcopy  HPI: Last Pap on 11/08/2020 showed ASCUS/HPV HR Pos.  HPV 16-18-45 Neg.  H/O abnormal Paps about 4-5 yrs ago, Colpo done, no treatment needed.  Declines STI screen.   OB History  Gravida Para Term Preterm AB Living  1 0     1 0  SAB IAB Ectopic Multiple Live Births               # Outcome Date GA Lbr Len/2nd Weight Sex Delivery Anes PTL Lv  1 AB             Past medical history,surgical history, problem list, medications, allergies, family history and social history were all reviewed and documented in the EPIC chart.   Directed ROS with pertinent positives and negatives documented in the history of present illness/assessment and plan.  Exam:  Vitals:   12/23/20 1344  BP: 110/74   General appearance:  Normal  Colposcopy Procedure Note Anne Hayden 12/23/2020  Indications: ASCUS/HPV HR pos  Procedure Details  The risks and benefits of the procedure and Written informed consent obtained.  Speculum placed in vagina and excellent visualization of cervix achieved, cervix swabbed x 3 with acetic acid solution.  Findings:  Cervix colposcopy: Physical Exam Genitourinary:       Vaginal colposcopy: Normal  Vulvar colposcopy: Normal  Perirectal colposcopy: Normal  The cervix was sprayed with Hurricane before performing the cervical biopsies.  Specimens: Cervical Bx at 3 and 9 O'Clock  Complications:  None, good hemostasis with Silver Nitrate . Plan: Management per results.   Assessment/Plan:  50 y.o. G1P0010   1. 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  Previous abnormal Pap about 4 to 5 years ago.  History of CIN-1.  No previous cervical treatment.  Colposcopy procedure reviewed with patient.  Colposcopy findings discussed.  Pending cervical biopsy.  Management per results.  Postprocedure precautions  reviewed. - Surgical pathology( [place]/ [facility])   Anne Del MD, 2:00 PM 12/23/2020

## 2020-12-25 LAB — SURGICAL PATHOLOGY

## 2021-01-16 ENCOUNTER — Other Ambulatory Visit: Payer: Self-pay

## 2021-01-16 ENCOUNTER — Telehealth (INDEPENDENT_AMBULATORY_CARE_PROVIDER_SITE_OTHER): Payer: 59 | Admitting: Psychiatry

## 2021-01-16 ENCOUNTER — Encounter (HOSPITAL_COMMUNITY): Payer: Self-pay | Admitting: Psychiatry

## 2021-01-16 DIAGNOSIS — F331 Major depressive disorder, recurrent, moderate: Secondary | ICD-10-CM

## 2021-01-16 DIAGNOSIS — F429 Obsessive-compulsive disorder, unspecified: Secondary | ICD-10-CM | POA: Diagnosis not present

## 2021-01-16 MED ORDER — CITALOPRAM HYDROBROMIDE 40 MG PO TABS: 40.0000 mg | ORAL_TABLET | Freq: Every day | ORAL | 2 refills | Status: DC

## 2021-01-16 MED ORDER — CLONAZEPAM 0.5 MG PO TABS: ORAL_TABLET | ORAL | 2 refills | Status: DC

## 2021-01-16 NOTE — Progress Notes (Signed)
Virtual Visit via Telephone Note  I connected with Anne Hayden on 01/16/21 at 10:00 AM EDT by telephone and verified that I am speaking with the correct person using two identifiers.  Location: Patient: home Provider: home office   I discussed the limitations, risks, security and privacy concerns of performing an evaluation and management service by telephone and the availability of in person appointments. I also discussed with the patient that there may be a patient responsible charge related to this service. The patient expressed understanding and agreed to proceed.   History of Present Illness: Patient is evaluated by phone session.  She is taking Celexa and Klonopin.  Patient told her anxiety got increased last month when her husband's mother died in [place].  Patient told she was only 50 year old.  Now she is very concerned about her own mother who is 49 year old and recently cologard positive results and need colonoscopy.  Patient told her husband's family who died only and she is very concerned about her husband.  She reported her husband's father also died early age.  Patient told she did not go to [place] for a funeral but her husband did for 5 days and when he returned back to [place] patient did stay with her husband in [place].  Patient told recently her husband got a promotion and she does not believe her husband has planned to move to [place].  Some nights she has difficulty sleeping because she is thinking about a lot of things.  She have some obsessive thoughts but denies any mania, hallucination, anger or any crying spells.  She denies any suicidal thoughts.  Energy level is fair.  Her appetite is okay.  Her weight is stable.     Past Psychiatric History:  H/O anxiety, OCD, mania and depression. H/O inpatient at [facility] in 2010 for depression and suicidal thoughts.  No h/o suicidal attempt.  Seroquel cause weight gain, Risperdal caused nausea, Trintellix  cause itching, Geodon, Neurontin, Paxil and Abilify did not help. Took Lamictal worked in [place] but did not work in [place]. Wellbutrin caused loss of taste. ECT helped.     Psychiatric Specialty Exam: Physical Exam  Review of Systems  Weight 101 lb (45.8 kg), last menstrual period 12/17/2020.There is no height or weight on file to calculate BMI.  General Appearance: NA  Eye Contact:  NA  Speech:  Slow  Volume:  Decreased  Mood:  Anxious  Affect:  NA  Thought Process:  Goal Directed  Orientation:  Full (Time, Place, and Person)  Thought Content:  Obsessions  Suicidal Thoughts:  No  Homicidal Thoughts:  No  Memory:  Immediate;   Good Recent;   Good Remote;   Fair  Judgement:  Fair  Insight:  Shallow  Psychomotor Activity:  NA  Concentration:  Concentration: Fair and Attention Span: Fair  Recall:  Good  Fund of Knowledge:  Good  Language:  Good  Akathisia:  No  Handed:  Right  AIMS (if indicated):     Assets:  Communication Skills Desire for Improvement Housing Transportation  ADL's:  Intact  Cognition:  WNL  Sleep:   ok      Assessment and Plan: Obsessive compulsive disorder.  Major depressive disorder, recurrent.  Reassurance given.  We have talked about in the past for therapy but patient reluctant as she is not sure if her insurance will pay.  I have told that she should call the insurance company if therapy appointments are in the  network.  She promised to call her insurance.  She does not want to change the medication.  We will continue Celexa 40 mg daily and Klonopin 0.5 mg twice a day and third if needed.  She feels her current medicine is helping her and her symptoms are manageable.  Recommended to call us back if is any question or any concern.  Follow-up in 3 months.  Follow Up Instructions:    I discussed the assessment and treatment plan with the patient. The patient was provided an opportunity to ask questions and all were answered. The patient agreed with the  plan and demonstrated an understanding of the instructions.   The patient was advised to call back or seek an in-person evaluation if the symptoms worsen or if the condition fails to improve as anticipated.  I provided 15 minutes of non-face-to-face time during this encounter.   Cleotis Nipper, MD

## 2021-01-20 ENCOUNTER — Ambulatory Visit
Admission: RE | Admit: 2021-01-20 | Discharge: 2021-01-20 | Disposition: A | Discharge status: Home / Self Care | Payer: PRIVATE HEALTH INSURANCE | Source: Ambulatory Visit | Attending: Family Medicine | Admitting: Family Medicine

## 2021-01-20 ENCOUNTER — Other Ambulatory Visit: Payer: Self-pay

## 2021-01-20 DIAGNOSIS — Z1231 Encounter for screening mammogram for malignant neoplasm of breast: Secondary | ICD-10-CM

## 2021-01-23 ENCOUNTER — Telehealth: Payer: Self-pay | Admitting: Family Medicine

## 2021-01-23 NOTE — Telephone Encounter (Signed)
Patient returned call for mammogram results. I relayed Dr. Alan Ripper message as follows: "Please inform patient her mammogram is normal." Patient voiced happy understanding.

## 2021-01-23 NOTE — Telephone Encounter (Signed)
Noted  

## 2021-04-18 ENCOUNTER — Encounter (HOSPITAL_COMMUNITY): Payer: Self-pay | Admitting: Psychiatry

## 2021-04-18 ENCOUNTER — Telehealth (HOSPITAL_BASED_OUTPATIENT_CLINIC_OR_DEPARTMENT_OTHER): Payer: 59 | Admitting: Psychiatry

## 2021-04-18 ENCOUNTER — Other Ambulatory Visit: Payer: Self-pay

## 2021-04-18 VITALS — Wt 101.0 lb

## 2021-04-18 DIAGNOSIS — F331 Major depressive disorder, recurrent, moderate: Secondary | ICD-10-CM

## 2021-04-18 DIAGNOSIS — F429 Obsessive-compulsive disorder, unspecified: Secondary | ICD-10-CM | POA: Diagnosis not present

## 2021-04-18 MED ORDER — CLONAZEPAM 0.5 MG PO TABS: ORAL_TABLET | ORAL | 2 refills | Status: DC

## 2021-04-18 MED ORDER — CITALOPRAM HYDROBROMIDE 40 MG PO TABS: 40.0000 mg | ORAL_TABLET | Freq: Every day | ORAL | 2 refills | Status: DC

## 2021-04-18 MED ORDER — BUSPIRONE HCL 5 MG PO TABS: 5.0000 mg | ORAL_TABLET | Freq: Two times a day (BID) | ORAL | 2 refills | Status: DC

## 2021-04-18 NOTE — Progress Notes (Signed)
Virtual Visit via Telephone Note  I connected with Penni Homans on 04/18/21 at 10:20 AM EDT by telephone and verified that I am speaking with the correct person using two identifiers.  Location: Patient: Home Provider: Home Office   I discussed the limitations, risks, security and privacy concerns of performing an evaluation and management service by telephone and the availability of in person appointments. I also discussed with the patient that there may be a patient responsible charge related to this service. The patient expressed understanding and agreed to proceed.   History of Present Illness: Patient is evaluated by phone session.  She is still struggling with anxiety and lately noticed having stomach issues because of anxiety.  She had obsessive thoughts about dying and not waking up in the morning.  Since her husband's mother died she is concerned about her own mother who is 49 year old.  Her appetite is okay.  She is going next week [place] to visit her husband and she is looking forward to.  She denies crying spells or any feeling of hopelessness.  She denies any paranoia or any suicidal thoughts.  Her energy level is fair.  Past Psychiatric History:  H/O anxiety, OCD, mania and depression. H/O inpatient at [facility] in [facility] for depression and suicidal thoughts.  No h/o suicidal attempt.  Seroquel cause weight gain, Risperdal caused nausea, Trintellix cause itching, Geodon, Neurontin, Paxil and Abilify did not help. Took Lamictal worked in [place] but did not work in [place]. Wellbutrin caused loss of taste. ECT helped.       Psychiatric Specialty Exam: Physical Exam  Review of Systems  Weight 101 lb (45.8 kg).There is no height or weight on file to calculate BMI.  General Appearance: NA  Eye Contact:  NA  Speech:  Slow  Volume:  Decreased  Mood:  Anxious  Affect:  NA  Thought Process:  Goal Directed  Orientation:  Full (Time, Place, and Person)  Thought Content:   Obsessions  Suicidal Thoughts:  No  Homicidal Thoughts:  No  Memory:  Immediate;   Good Recent;   Good Remote;   Good  Judgement:  Intact  Insight:  Present  Psychomotor Activity:  NA  Concentration:  Concentration: Fair and Attention Span: Fair  Recall:  Good  Fund of Knowledge:  Good  Language:  Good  Akathisia:  No  Handed:  Right  AIMS (if indicated):     Assets:  Communication Skills Desire for Improvement Housing Transportation  ADL's:  Intact  Cognition:  WNL  Sleep:   ok      Assessment and Plan: Obsessive-compulsive disorder.  Major depressive disorder, recurrent.  Patient is still have residual anxiety and sometimes she have stomach issues related to Anxiety.  We talked about trying the BuSpar.  Patient recall took in her school age but do not remember the details.  I recommend trying BuSpar 5 mg twice a day to supplement and to help her anxiety and OCD symptoms.  Patient will continue Celexa 40 mg daily and Klonopin 0.5 mg 2-3 times a day.  Patient is not interested in therapy.  Recommended to call us back if she is any question or any concern.  Follow-up in 3 months.  Follow Up Instructions:    I discussed the assessment and treatment plan with the patient. The patient was provided an opportunity to ask questions and all were answered. The patient agreed with the plan and demonstrated an understanding of the instructions.   The  patient was advised to call back or seek an in-person evaluation if the symptoms worsen or if the condition fails to improve as anticipated.  I provided 15 minutes of non-face-to-face time during this encounter.   Cleotis Nipper, MD

## 2021-05-10 ENCOUNTER — Other Ambulatory Visit (HOSPITAL_COMMUNITY): Payer: Self-pay | Admitting: Psychiatry

## 2021-05-10 DIAGNOSIS — F429 Obsessive-compulsive disorder, unspecified: Secondary | ICD-10-CM

## 2021-06-08 ENCOUNTER — Other Ambulatory Visit (HOSPITAL_COMMUNITY): Payer: Self-pay | Admitting: Psychiatry

## 2021-06-08 DIAGNOSIS — F429 Obsessive-compulsive disorder, unspecified: Secondary | ICD-10-CM

## 2021-06-19 ENCOUNTER — Other Ambulatory Visit: Payer: Self-pay

## 2021-06-19 ENCOUNTER — Telehealth (HOSPITAL_BASED_OUTPATIENT_CLINIC_OR_DEPARTMENT_OTHER): Payer: 59 | Admitting: Psychiatry

## 2021-06-19 ENCOUNTER — Encounter (HOSPITAL_COMMUNITY): Payer: Self-pay | Admitting: Psychiatry

## 2021-06-19 VITALS — Wt 101.0 lb

## 2021-06-19 DIAGNOSIS — F429 Obsessive-compulsive disorder, unspecified: Secondary | ICD-10-CM

## 2021-06-19 DIAGNOSIS — F331 Major depressive disorder, recurrent, moderate: Secondary | ICD-10-CM

## 2021-06-19 MED ORDER — CITALOPRAM HYDROBROMIDE 40 MG PO TABS: 40.0000 mg | ORAL_TABLET | Freq: Every day | ORAL | 2 refills | Status: DC

## 2021-06-19 MED ORDER — CLONAZEPAM 0.5 MG PO TABS: ORAL_TABLET | ORAL | 2 refills | Status: DC

## 2021-06-19 MED ORDER — LYBALVI 5-10 MG PO TABS: 10.0000 mg | ORAL_TABLET | Freq: Every day | ORAL | 0 refills | Status: DC

## 2021-06-19 NOTE — Progress Notes (Signed)
Virtual Visit via Telephone Note  I connected with Anne Hayden on 06/19/21 at  9:00 AM EST by telephone and verified that I am speaking with the correct person using two identifiers.  Location: Patient: Home Provider: Home office   I discussed the limitations, risks, security and privacy concerns of performing an evaluation and management service by telephone and the availability of in person appointments. I also discussed with the patient that there may be a patient responsible charge related to this service. The patient expressed understanding and agreed to proceed.   History of Present Illness: Patient is evaluated by phone session.  We started her on BuSpar on the last visit.  Patient did not notice any improvement and actually she felt that she has increased spending in the beginning but she was able to control her impulsivity.  She is not spending but is still feeling anxious and nervous and having racing thoughts.  Patient is obsessed about a lot of things.  Lately she is anxious about her foot which required surgery on 21st.  She has a fusion procedure to be done on her right toe.  She has difficulty walking.  She is concerned because her mother requires going to the doctor's office in multiple places and sometimes she has difficulty driving because of pain.  Her 9 year old mother lives with her. Her husband coming next week.  She is also very concerned about her dog which she had at 3 months ago.  Patient told that dog is very big and is heavy and sometimes difficulty to take him to walk.  She also reported that her dog is fighting with the previous dog which she had and in the morning they have to separate them.  She had called rescue animal and hoping they will take the dog.  She is sad about because she is attached to the dog but cannot keep it because of fighting and being very heavy.  She admitted sometimes crying spells but denies any suicidal thoughts.  She has a lot of ruminative  and negative thoughts.  Lately she is reading about [employer] of mental health about the research projects and she is interested if she could be a candidate to help her symptoms.  She denies any paranoia or any hallucination.  Her energy level is fair.  She is not sleeping very well.  Her appetite is fair and her weight is unchanged from the past.  Past Psychiatric History:  H/O anxiety, OCD, mania and depression. H/O inpatient at [facility] in 2010 for depression and suicidal thoughts.  No h/o suicidal attempt.  Seroquel cause weight gain, Risperdal caused nausea, Trintellix cause itching, Geodon, Neurontin, Paxil and Abilify did not help. Took Lamictal worked in [place] but did not work in [place]. Wellbutrin caused loss of taste. ECT helped.     Psychiatric Specialty Exam: Physical Exam  Review of Systems  Weight 101 lb (45.8 kg).There is no height or weight on file to calculate BMI.  General Appearance: NA  Eye Contact:  NA  Speech:  Slow  Volume:  Decreased  Mood:  Anxious  Affect:  NA  Thought Process:  Goal Directed  Orientation:  Full (Time, Place, and Person)  Thought Content:  Rumination  Suicidal Thoughts:  No  Homicidal Thoughts:  No  Memory:  Immediate;   Good Recent;   Good Remote;   Good  Judgement:  Fair  Insight:  Shallow  Psychomotor Activity:  NA  Concentration:  Concentration: Fair  and Attention Span: Fair  Recall:  Good  Fund of Knowledge:  Good  Language:  Good  Akathisia:  No  Handed:  Right  AIMS (if indicated):     Assets:  Communication Skills Desire for Improvement Housing  ADL's:  Intact  Cognition:  WNL  Sleep:   fair      Assessment and Plan: Obsessive-compulsive disorder.  Major depressive disorder, recurrent.  I reviewed previous medication.  She had tried Lamictal, Geodon, gabapentin, Paxil, Abilify, Risperdal, Seroquel, Trintellix and now lately buspirone.  She also had ECT which helped but she is not interested to do  it again.  She is also not interested in therapy.  Now she is interested in the research trial of medication at [employer] of Mental health.  She is not sure if she would like to do because that requires traveling but she has foot pain and she is taking care of her 44 year old mother.  We discussed try Lybalvi which she has never tried before.  She agreed to give a try.  We will discontinue BuSpar since it did not help.  I recommend to continue Celexa 40 mg daily, Klonopin 0.5 mg 2-3 times a day and we will start low-dose Lybalvi and we will provide some samples to see if that helps.  If it did not help then we will consider again for ECT referral but patient has been reluctant to do it again.  She is hoping once her dog is taken by the rescue animal and husband back from [place] things will get better.  She is willing to try the new medication.  I recommended if symptoms get worse or have any time suicidal thoughts then she should call us back immediately.  Follow-up in 4 to 6 weeks.  Follow Up Instructions:    I discussed the assessment and treatment plan with the patient. The patient was provided an opportunity to ask questions and all were answered. The patient agreed with the plan and demonstrated an understanding of the instructions.   The patient was advised to call back or seek an in-person evaluation if the symptoms worsen or if the condition fails to improve as anticipated.  I provided 32 minutes of non-face-to-face time during this encounter.   Cleotis Nipper, MD

## 2021-06-23 ENCOUNTER — Telehealth (HOSPITAL_COMMUNITY): Payer: Self-pay | Admitting: *Deleted

## 2021-06-23 NOTE — Telephone Encounter (Signed)
She can stop Celexa and for continue lybalvi. If symptoms worst than stop lybalvi and restart Celexa.

## 2021-06-23 NOTE — Telephone Encounter (Signed)
She needs to stop when taking opioids, no?

## 2021-06-23 NOTE — Telephone Encounter (Signed)
Pt called stating that since starting the Lybalvi 5-10 mg last week she has been "talking a lot" , excess energy, increased appetite, decreased sleep. Also pt will be having a procedure done next week and opioids will be used. Pt next appointment is scheduled for 07/31/21. Please review.

## 2021-06-23 NOTE — Telephone Encounter (Signed)
No but if s/s persist or get worst than she need to stop.

## 2021-07-14 ENCOUNTER — Other Ambulatory Visit (HOSPITAL_COMMUNITY): Payer: Self-pay | Admitting: Psychiatry

## 2021-07-14 DIAGNOSIS — F429 Obsessive-compulsive disorder, unspecified: Secondary | ICD-10-CM

## 2021-07-17 ENCOUNTER — Telehealth (HOSPITAL_COMMUNITY): Payer: Self-pay

## 2021-07-17 NOTE — Telephone Encounter (Signed)
Medication refill - Fax received for a refill of patient's prescribed Buspirone, last ordered 04/18/21 + 2 refills.  Patient to return on 07/31/21.

## 2021-07-17 NOTE — Telephone Encounter (Signed)
It was d/c as patient not taking. Disregard the fax.

## 2021-07-31 ENCOUNTER — Encounter (HOSPITAL_COMMUNITY): Payer: Self-pay | Admitting: Psychiatry

## 2021-07-31 ENCOUNTER — Telehealth (HOSPITAL_BASED_OUTPATIENT_CLINIC_OR_DEPARTMENT_OTHER): Payer: 59 | Admitting: Psychiatry

## 2021-07-31 ENCOUNTER — Other Ambulatory Visit: Payer: Self-pay

## 2021-07-31 DIAGNOSIS — F429 Obsessive-compulsive disorder, unspecified: Secondary | ICD-10-CM | POA: Diagnosis not present

## 2021-07-31 DIAGNOSIS — F331 Major depressive disorder, recurrent, moderate: Secondary | ICD-10-CM | POA: Diagnosis not present

## 2021-07-31 NOTE — Progress Notes (Signed)
Virtual Visit via Telephone Note  I connected with Anne Hayden on 07/31/21 at  2:40 PM EST by telephone and verified that I am speaking with the correct person using two identifiers.  Location: Patient: Home Provider: Office   I discussed the limitations, risks, security and privacy concerns of performing an evaluation and management service by telephone and the availability of in person appointments. I also discussed with the patient that there may be a patient responsible charge related to this service. The patient expressed understanding and agreed to proceed.   History of Present Illness: Patient is evaluated by phone session.  She did not like Lybalvi because after taking for a few days she gained 3 pounds and she is scared to take overdoses.  She had a foot surgery 2 weeks ago and slowly and gradually she is recovering.  She is not taking any narcotic pain medication but is still not able to drive.  She admitted disappointment with the new medication and now she is open to consider ketamine treatment or even ECT treatment.  She is still working on trial at [facility] [employer].  She is back on Klonopin and Celexa.  Sometimes she has crying spells and feels anxious but denies any suicidal thoughts.  Her husband is still in [place].  She endorsed lately not able to go outside because of the pain but hoping once pain get better she will consider walking.  Since she stopped the Lybalvi her weight came down to previous.  She has no tremor or shakes or any EPS.  She does chronic obsessive thoughts which comes and goes but denies any hallucination or any paranoia.   Past Psychiatric History:  H/O anxiety, OCD, mania and depression. H/O inpatient at [facility] in 2010 for depression and suicidal thoughts.  No h/o suicidal attempt.  Seroquel cause weight gain, Risperdal caused nausea, Trintellix cause itching, Geodon, Neurontin, Paxil and Abilify did not help. Took Lamictal worked in [place]  but did not work in [place]. Wellbutrin caused loss of taste. ECT helped.      Psychiatric Specialty Exam: Physical Exam  Review of Systems  Weight 106 lb (48.1 kg).There is no height or weight on file to calculate BMI.  General Appearance: NA  Eye Contact:  NA  Speech:  Slow  Volume:  Decreased  Mood:  Anxious and Dysphoric  Affect:  NA  Thought Process:  Goal Directed  Orientation:  Full (Time, Place, and Person)  Thought Content:  Rumination  Suicidal Thoughts:  No  Homicidal Thoughts:  No  Memory:  Immediate;   Good Recent;   Good Remote;   Good  Judgement:  Fair  Insight:  Shallow  Psychomotor Activity:  NA  Concentration:  Concentration: Fair and Attention Span: Fair  Recall:  Fair  Fund of Knowledge:  Good  Language:  Good  Akathisia:  No  Handed:  Right  AIMS (if indicated):     Assets:  Communication Skills Desire for Improvement Housing  ADL's:  Intact  Cognition:  WNL  Sleep:   fair      Assessment and Plan: Obsessive-compulsive disorder.  Major depressive disorder, recurrent.  Patient could not tolerate Lybalvi and discontinued.  She is back on Celexa and Klonopin.  I recommend she should consider ketamine treatment while she is waiting for [facility] trial.  She agree and she promised to give call to the ketamine treatment places.  She also open to reconsider ECT which she has been reluctant in  the past.  She like to continue for now Celexa 40 mg daily and Klonopin 0.5 mg 2-3 times a day.  Discussed medication side effects and benefits.  Recommended to call us back if she has any question or any concern.  Patient like to have a follow-up in 4 weeks in person and she is hoping at that time she is able to walk better.  Patient has refill remaining on these medication.  She is not taking buspirone.  Follow Up Instructions:    I discussed the assessment and treatment plan with the patient. The patient was provided an opportunity to ask questions and all were answered.  The patient agreed with the plan and demonstrated an understanding of the instructions.   The patient was advised to call back or seek an in-person evaluation if the symptoms worsen or if the condition fails to improve as anticipated.  I provided 16 minutes of non-face-to-face time during this encounter.   Cleotis Nipper, MD

## 2021-09-04 ENCOUNTER — Ambulatory Visit (HOSPITAL_BASED_OUTPATIENT_CLINIC_OR_DEPARTMENT_OTHER): Payer: 59 | Admitting: Psychiatry

## 2021-09-04 ENCOUNTER — Other Ambulatory Visit: Payer: Self-pay

## 2021-09-04 ENCOUNTER — Encounter (HOSPITAL_COMMUNITY): Payer: Self-pay | Admitting: Psychiatry

## 2021-09-04 DIAGNOSIS — F429 Obsessive-compulsive disorder, unspecified: Secondary | ICD-10-CM

## 2021-09-04 DIAGNOSIS — F331 Major depressive disorder, recurrent, moderate: Secondary | ICD-10-CM

## 2021-09-04 MED ORDER — CITALOPRAM HYDROBROMIDE 40 MG PO TABS: 40.0000 mg | ORAL_TABLET | Freq: Every day | ORAL | 2 refills | Status: DC

## 2021-09-04 MED ORDER — CLONAZEPAM 0.5 MG PO TABS: ORAL_TABLET | ORAL | 2 refills | Status: DC

## 2021-09-04 NOTE — Progress Notes (Signed)
MD Progress Note   Location: Patient: Office  Provider: In person at Office     History of Present Illness: Patient is evaluated in person.  She came to the office.  She is taking Klonopin and Celexa.  Her foot is much better and she started to walk and drive.  However she is still not completely recovered.  She continues to have chronic depression and chronic suicidal thoughts but no plan or any intent.  Lately she endorsed increased obsession about buying antiques.  She recently spent money on 18th century locket and some jewelry.  She is not happy because husband has to pay for the expense.  She admitted sometime her thoughts are wandering.  She gets racing thoughts, restlessness.  She is able to get 9 to 10 hours but need a nap in the evening.  Last week is good because husband is visiting and she is hoping next week if her foot get better then she may go to [place].  She admitted some anxiety because she used to enjoy walking and lately because of the foot surgery and pain and able to walk as much.  She has not heard anything from [facility] for ketamine.  If she approved she may have to stay 5 weeks and she is not sure if she can do it.  Now she is more open about ECT but still like to wait from the test.  She has titanium in her foot and she required MRI of her head before ketamine and she needed clearance for MRI.  She denies any hallucination, paranoia.  She denies any anger or any irritability.  Her appetite is okay.  Her weight is unchanged from the past.  She is very reluctant to try any medicine which is very expensive, requires blood work or weight gain.  She denies any purging.  She denies any tremors or shakes.  Past Psychiatric History:  H/O anxiety, OCD, mania and depression. H/O inpatient at [facility] in 2010 for depression and suicidal thoughts.  No h/o suicidal attempt.  Seroquel cause weight gain, Risperdal caused nausea, Trintellix cause itching, Geodon,  Neurontin, Paxil and Abilify did not help. Took Lamictal worked in [place] but did not work in [place]. Wellbutrin caused loss of taste. ECT helped.       Psychiatric Specialty Exam: Physical Exam  Review of Systems  Blood pressure 103/70, pulse 83, temperature 98.7 F (37.1 C), temperature source Oral, resp. rate 16, height 5\' 1"  (1.549 m), weight 103 lb 3.2 oz (46.8 kg), SpO2 98 %.There is no height or weight on file to calculate BMI.  General Appearance: Neat and thin  Eye Contact:  Fair  Speech:  Slow  Volume:  Decreased  Mood:  Dysphoric  Affect:  Restricted  Thought Process:  Descriptions of Associations: Intact  Orientation:  Full (Time, Place, and Person)  Thought Content:  Obsessions and Rumination  Suicidal Thoughts:  No  Homicidal Thoughts:  No  Memory:  Immediate;   Good Recent;   Fair Remote;   Fair  Judgement:  Fair  Insight:  Present  Psychomotor Activity:  Decreased  Concentration:  Concentration: Fair and Attention Span: Fair  Recall:  Good  Fund of Knowledge:  Good  Language:  Good  Akathisia:  No  Handed:  Right  AIMS (if indicated):     Assets:  Communication Skills Desire for Improvement Housing Transportation  ADL's:  Intact  Cognition:  WNL  Sleep:   10 hrs including  nap in evening      Assessment and Plan: Obsessive-compulsive disorder.  Major depressive disorder, recurrent.  Discuss to consider other option other than ketamine like ECT which had helped with the past.  However patient reluctant and given the past side effects of memory issues she preferred ketamine.  She like to wait to get approval for ketamine at [facility].  She wants to keep the Celexa and Klonopin.  She is not interested in therapy.  We will continue Klonopin 0.5 mg 2-3 times a day and Celexa 40 mg daily.  Recommended to call us back if she is any question or any concern.  Follow-up in 3 months.   Follow Up Instructions:    I discussed the assessment and treatment plan with the  patient. The patient was provided an opportunity to ask questions and all were answered. The patient agreed with the plan and demonstrated an understanding of the instructions.    Kathlee Nations, MD

## 2021-11-03 ENCOUNTER — Encounter (HOSPITAL_COMMUNITY): Payer: Self-pay | Admitting: Psychiatry

## 2021-11-03 ENCOUNTER — Telehealth (HOSPITAL_BASED_OUTPATIENT_CLINIC_OR_DEPARTMENT_OTHER): Payer: 59 | Admitting: Psychiatry

## 2021-11-03 DIAGNOSIS — F429 Obsessive-compulsive disorder, unspecified: Secondary | ICD-10-CM

## 2021-11-03 DIAGNOSIS — F331 Major depressive disorder, recurrent, moderate: Secondary | ICD-10-CM | POA: Diagnosis not present

## 2021-11-03 MED ORDER — CLONAZEPAM 0.5 MG PO TABS: ORAL_TABLET | ORAL | 2 refills | Status: DC

## 2021-11-03 MED ORDER — CITALOPRAM HYDROBROMIDE 40 MG PO TABS: 40.0000 mg | ORAL_TABLET | Freq: Every day | ORAL | 2 refills | Status: DC

## 2021-11-03 NOTE — Progress Notes (Signed)
Virtual Visit via Telephone Note ? ?I connected with Penni Homans on 11/03/21 at  2:00 PM EDT by telephone and verified that I am speaking with the correct person using two identifiers. ? ?Location: ?Patient: Home ?Provider: Home Office ?  ?I discussed the limitations, risks, security and privacy concerns of performing an evaluation and management service by telephone and the availability of in person appointments. I also discussed with the patient that there may be a patient responsible charge related to this service. The patient expressed understanding and agreed to proceed. ? ? ?History of Present Illness: ?Patient is evaluated by phone session.  She is taking Celexa and Klonopin.  There are days that she take Klonopin only today that she takes up to 3 a day.  She feels her anxiety and OCD is stable.  Occasionally she has plan.  She started walking every day with her dog.  She had a good support from her mother.  Her husband came for Easter weekend and again coming on Memorial Day.  She is sleeping better.  She denies any panic attack.  She like to keep the current medication and like to get some more time to do research on Ketamine.  She may go for the treatment if she feels necessary.  She denies feeling of hopelessness or worthlessness.  She does not want to change the medication. ?  ?Past Psychiatric History:  ?H/O anxiety, OCD, mania and depression. H/O inpatient at [facility] in 2010. No h/o suicidal attempt.  Seroquel cause weight gain, Risperdal caused nausea, Trintellix cause itching, Geodon, Neurontin, Paxil and Abilify did not help. Took Lamictal worked in [place] but did not work in [place]. Wellbutrin caused loss of taste. ECT helped.   ? ? ?Psychiatric Specialty Exam: ?Physical Exam  ?Review of Systems  ?Weight 100 lb (45.4 kg).There is no height or weight on file to calculate BMI.  ?General Appearance: NA  ?Eye Contact:  NA  ?Speech:  Slow  ?Volume:  Decreased  ?Mood:  Dysphoric  ?Affect:   NA  ?Thought Process:  Descriptions of Associations: Intact  ?Orientation:  Full (Time, Place, and Person)  ?Thought Content:  Rumination  ?Suicidal Thoughts:  No  ?Homicidal Thoughts:  No  ?Memory:  Immediate;   Good ?Recent;   Good ?Remote;   Good  ?Judgement:  Fair  ?Insight:  Present  ?Psychomotor Activity:  NA  ?Concentration:  Concentration: Fair and Attention Span: Fair  ?Recall:  Fair  ?Fund of Knowledge:  Good  ?Language:  Good  ?Akathisia:  No  ?Handed:  Right  ?AIMS (if indicated):     ?Assets:  Communication Skills ?Desire for Improvement ?Housing ?Social Support ?Transportation  ?ADL's:  Intact  ?Cognition:  WNL  ?Sleep:     ? ? ? ? ?Assessment and Plan: ?Obsessive-compulsive disorder.  Major depressive disorder, recurrent. ? ?Patient like to keep the current medication Celexa and Klonopin.  She still need more time to think about ketamine treatment.  So far she has no side effects.  Continue Klonopin 0.5 mg 2-3 times a day and Celexa 40 mg daily.  Recommended to call us back if she has any question or any concern.  Follow-up in 3 months. ? ?Follow Up Instructions: ? ?  ?I discussed the assessment and treatment plan with the patient. The patient was provided an opportunity to ask questions and all were answered. The patient agreed with the plan and demonstrated an understanding of the instructions. ?  ?The patient was advised  to call back or seek an in-person evaluation if the symptoms worsen or if the condition fails to improve as anticipated. ? ?Collaboration of Care: Primary Care Provider AEB notes are in epic to review.  ? ?Patient/Guardian was advised Release of Information must be obtained prior to any record release in order to collaborate their care with an outside provider. Patient/Guardian was advised if they have not already done so to contact the registration department to sign all necessary forms in order for [place] to release information regarding their care.  ? ?Consent: Patient/Guardian  gives verbal consent for treatment and assignment of benefits for services provided during this visit. Patient/Guardian expressed understanding and agreed to proceed.   ? ?I provided 22 minutes of non-face-to-face time during this encounter. ? ? ?Cleotis Nipper, MD  ?

## 2021-11-10 ENCOUNTER — Encounter: Payer: Self-pay | Admitting: Obstetrics & Gynecology

## 2021-11-10 ENCOUNTER — Other Ambulatory Visit (HOSPITAL_COMMUNITY)
Admission: RE | Admit: 2021-11-10 | Discharge: 2021-11-10 | Disposition: A | Discharge status: Home / Self Care | Payer: 59 | Source: Ambulatory Visit | Attending: Obstetrics & Gynecology | Admitting: Obstetrics & Gynecology

## 2021-11-10 ENCOUNTER — Ambulatory Visit (INDEPENDENT_AMBULATORY_CARE_PROVIDER_SITE_OTHER): Payer: 59 | Admitting: Obstetrics & Gynecology

## 2021-11-10 VITALS — BP 108/68 | HR 79 | Ht 61.75 in | Wt 102.0 lb

## 2021-11-10 DIAGNOSIS — Z01419 Encounter for gynecological examination (general) (routine) without abnormal findings: Secondary | ICD-10-CM | POA: Diagnosis not present

## 2021-11-10 DIAGNOSIS — Z3041 Encounter for surveillance of contraceptive pills: Secondary | ICD-10-CM | POA: Diagnosis not present

## 2021-11-10 DIAGNOSIS — N870 Mild cervical dysplasia: Secondary | ICD-10-CM

## 2021-11-10 DIAGNOSIS — R87810 Cervical high risk human papillomavirus (HPV) DNA test positive: Secondary | ICD-10-CM | POA: Insufficient documentation

## 2021-11-10 DIAGNOSIS — R87610 Atypical squamous cells of undetermined significance on cytologic smear of cervix (ASC-US): Secondary | ICD-10-CM | POA: Insufficient documentation

## 2021-11-10 MED ORDER — BALZIVA 0.4-35 MG-MCG PO TABS
1.0000 | ORAL_TABLET | Freq: Every day | ORAL | 4 refills | Status: DC
Start: 2021-11-10 — End: 2023-02-02

## 2021-11-10 NOTE — Progress Notes (Signed)
? ? ?Anne Hayden 01/20/1971 [id number] ? ? ?History:    51 y.o. G1P0A1 Married x 10 yrs.   ?  ?RP:  patient presenting for annual gyn exam  ?  ?HPI: Well on Balziva.  No BTB.  No pelvic pain.  Occasionally sexually active, no pain with IC.  Pap 10/2020 ASCUS/HPV HR Pos.  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 Urine/BMs normal.  Breasts normal. Mammo Neg 01/2021. BMI 18.81, stable.  Health labs with Fam MD. Anne Hayden 02/2019. ? ?Past medical history,surgical history, family history and social history were all reviewed and documented in the EPIC chart. ? ?Gynecologic History ?Patient's last menstrual period was 10/14/2021 (exact date). ? ?Obstetric History ?OB History  ?Gravida Para Term Preterm AB Living  ?1 0     1 0  ?SAB IAB Ectopic Multiple Live Births  ?           ?  ?# Outcome Date GA Lbr Len/2nd Weight Sex Delivery Anes PTL Lv  ?1 AB           ? ? ? ?ROS: A ROS was performed and pertinent positives and negatives are included in the history. ?GENERAL: No fevers or chills. HEENT: No change in vision, no earache, sore throat or sinus congestion. NECK: No pain or stiffness. CARDIOVASCULAR: No chest pain or pressure. No palpitations. PULMONARY: No shortness of breath, cough or wheeze. GASTROINTESTINAL: No abdominal pain, nausea, vomiting or diarrhea, melena or bright red blood per rectum. GENITOURINARY: No urinary frequency, urgency, hesitancy or dysuria. MUSCULOSKELETAL: No joint or muscle pain, no back pain, no recent trauma. DERMATOLOGIC: No rash, no itching, no lesions. ENDOCRINE: No polyuria, polydipsia, no heat or cold intolerance. No recent change in weight. HEMATOLOGICAL: No anemia or easy bruising or bleeding. NEUROLOGIC: No headache, seizures, numbness, tingling or weakness. PSYCHIATRIC: No depression, no loss of interest in normal activity or change in sleep pattern.  ?  ? ?Exam: ? ? ?BP 108/68   Pulse 79   Ht 5' 1.75" (1.568 m)   Wt 102 lb (46.3 kg)   LMP 10/14/2021 (Exact Date)   SpO2 97%   BMI 18.81 kg/m?  ? ?Body  mass index is 18.81 kg/m?. ? ?General appearance : Well developed well nourished female. No acute distress ?HEENT: Eyes: no retinal hemorrhage or exudates,  Neck supple, trachea midline, no carotid bruits, no thyroidmegaly ?Lungs: Clear to auscultation, no rhonchi or wheezes, or rib retractions  ?Heart: Regular rate and rhythm, no murmurs or gallops ?Breast:Examined in sitting and supine position were symmetrical in appearance, no palpable masses or tenderness,  no skin retraction, no nipple inversion, no nipple discharge, no skin discoloration, no axillary or supraclavicular lymphadenopathy ?Abdomen: no palpable masses or tenderness, no rebound or guarding ?Extremities: no edema or skin discoloration or tenderness ? ?Pelvic: Vulva: Normal ?            Vagina: No gross lesions or discharge ? Cervix: No gross lesions or discharge.  Pap/HPV HR done. ? Uterus  AV, normal size, shape and consistency, non-tender and mobile ? Adnexa  Without masses or tenderness ? Anus: Normal ? ? ?Assessment/Plan:  51 y.o. female for annual exam  ? ?1. Encounter for routine gynecological examination with Papanicolaou smear of cervix ?Well on Balziva.  No BTB.  No pelvic pain.  Occasionally sexually active, no pain with IC.  Pap 10/2020 ASCUS/HPV HR Pos.  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  Urine/BMs normal.  Breasts normal. Mammo Neg 01/2021. BMI 18.81, stable.  Health labs with [facility]  MD. Anne Hayden 02/2019. ?- Cytology - PAP( [place]) ? ?2. ASCUS with positive high risk HPV cervical ?- Cytology - PAP( [place]) ? ?3. CIN I (cervical intraepithelial neoplasia I) ?Colpo 12/2020 CIN 1. ?- Cytology - PAP( [place]) ? ?4. Encounter for surveillance of contraceptive pills ?Well on Balziva.  No CI to continue.  Prescription sent to pharmacy. ? ?Other orders ?- bimatoprost (LATISSE) 0.03 % ophthalmic solution; APPLY TO AFFECTED AREA EVERY DAY AS DIRECTED ?- tretinoin (RETIN-A) 0.1 % cream; Apply topically at bedtime. ?- norethindrone-ethinyl estradiol  (BALZIVA) 0.4-35 MG-MCG tablet; Take 1 tablet by mouth daily.  ? ?Anne Del MD, 10:28 AM 11/10/2021 ? ?  ?

## 2021-11-12 LAB — CYTOLOGY - PAP
Comment: NEGATIVE
Diagnosis: NEGATIVE
High risk HPV: NEGATIVE

## 2021-12-31 ENCOUNTER — Other Ambulatory Visit: Payer: Self-pay | Admitting: Obstetrics & Gynecology

## 2021-12-31 ENCOUNTER — Other Ambulatory Visit: Payer: Self-pay | Admitting: Family Medicine

## 2021-12-31 DIAGNOSIS — Z1231 Encounter for screening mammogram for malignant neoplasm of breast: Secondary | ICD-10-CM

## 2022-01-22 ENCOUNTER — Ambulatory Visit: Payer: PRIVATE HEALTH INSURANCE

## 2022-02-02 ENCOUNTER — Ambulatory Visit (INDEPENDENT_AMBULATORY_CARE_PROVIDER_SITE_OTHER): Payer: 59 | Admitting: Family Medicine

## 2022-02-02 ENCOUNTER — Encounter (HOSPITAL_COMMUNITY): Payer: Self-pay | Admitting: Psychiatry

## 2022-02-02 ENCOUNTER — Telehealth (HOSPITAL_COMMUNITY): Payer: Self-pay | Admitting: *Deleted

## 2022-02-02 ENCOUNTER — Telehealth (HOSPITAL_BASED_OUTPATIENT_CLINIC_OR_DEPARTMENT_OTHER): Payer: 59 | Admitting: Psychiatry

## 2022-02-02 ENCOUNTER — Encounter: Payer: Self-pay | Admitting: Family Medicine

## 2022-02-02 VITALS — BP 98/62 | HR 77 | Temp 98.1°F | Ht 61.75 in | Wt 101.0 lb

## 2022-02-02 DIAGNOSIS — E559 Vitamin D deficiency, unspecified: Secondary | ICD-10-CM | POA: Diagnosis not present

## 2022-02-02 DIAGNOSIS — Z0000 Encounter for general adult medical examination without abnormal findings: Secondary | ICD-10-CM

## 2022-02-02 DIAGNOSIS — Z79899 Other long term (current) drug therapy: Secondary | ICD-10-CM

## 2022-02-02 DIAGNOSIS — F429 Obsessive-compulsive disorder, unspecified: Secondary | ICD-10-CM

## 2022-02-02 DIAGNOSIS — F331 Major depressive disorder, recurrent, moderate: Secondary | ICD-10-CM

## 2022-02-02 MED ORDER — CLONAZEPAM 0.5 MG PO TABS: ORAL_TABLET | ORAL | 2 refills | Status: DC

## 2022-02-02 MED ORDER — CITALOPRAM HYDROBROMIDE 40 MG PO TABS: 40.0000 mg | ORAL_TABLET | Freq: Every day | ORAL | 2 refills | Status: DC

## 2022-02-02 NOTE — Progress Notes (Signed)
Virtual Visit via Telephone Note  I connected with Anne Hayden on 02/02/22 at  2:20 PM EDT by telephone and verified that I am speaking with the correct person using two identifiers.  Location: Patient: Home Provider: Home Office   I discussed the limitations, risks, security and privacy concerns of performing an evaluation and management service by telephone and the availability of in person appointments. I also discussed with the patient that there may be a patient responsible charge related to this service. The patient expressed understanding and agreed to proceed.   History of Present Illness: Patient is evaluated by phone session.  She is taking Celexa and Klonopin.  She reported her mood is remain the same.  She do get the bouts of depression and anxiety and now thinking seriously to get ketamine treatment.  She told her mother also recommend to look into her weight and recently one of her friend received ketamine treatment that helps her depression.  She does walk every day with the dog.  She denies any crying spells but there are days when she is sad, depression and has no energy to do things.  She is sad because she misses her husband who is working in [place].  Patient told he has to do few travels and then maybe he will think about relocating to [place].  Patient denies any suicidal thoughts but admitted chronic anhedonia.  Her appetite is okay.  Her weight is stable.  Recently she established care with the PCP and today she is going for blood work.  She is taking Klonopin and Zoloft.  Her OCD symptoms are chronic but stable.  Past Psychiatric History:  H/O anxiety, OCD, mania and depression. H/O inpatient at [facility] in 2010. No h/o suicidal attempt.  Seroquel cause weight gain, Risperdal caused nausea, Trintellix cause itching, Geodon, Neurontin, Paxil and Abilify did not help. Took Lamictal worked in [place] but did not work in [place]. Wellbutrin caused loss of taste.  ECT helped.       Psychiatric Specialty Exam: Physical Exam  Review of Systems  Weight 102 lb (46.3 kg).There is no height or weight on file to calculate BMI.  General Appearance: NA  Eye Contact:  NA  Speech:  Slow  Volume:  Decreased  Mood:  Dysphoric  Affect:  NA  Thought Process:  Descriptions of Associations: Intact  Orientation:  Full (Time, Place, and Person)  Thought Content:  Rumination  Suicidal Thoughts:  No  Homicidal Thoughts:  No  Memory:  Immediate;   Good Recent;   Good Remote;   Good  Judgement:  Fair  Insight:  Shallow  Psychomotor Activity:  NA  Concentration:  Concentration: Fair and Attention Span: Fair  Recall:  [employer] of Knowledge:  Fair  Language:  Fair  Akathisia:  No  Handed:  Right  AIMS (if indicated):     Assets:  Communication Skills Desire for Improvement Housing Transportation  ADL's:  Intact  Cognition:  WNL  Sleep:   ok     Assessment and Plan: Obsessive-compulsive disorder.  Major depressive disorder, recurrent.  Patient now agree to give a trial of ketamine treatment.  We will refer her out as I explained we do not do ketamine in our office.  In the meantime she agreed to continue Klonopin 0.5 mg 2-3 times a day and Celexa 40 mg daily.  Recommended to call us back if she has any questions or any concern.  Follow-up in 3 months.  Follow Up Instructions:    I discussed the assessment and treatment plan with the patient. The patient was provided an opportunity to ask questions and all were answered. The patient agreed with the plan and demonstrated an understanding of the instructions.   The patient was advised to call back or seek an in-person evaluation if the symptoms worsen or if the condition fails to improve as anticipated.  Collaboration of Care: Primary Care Provider AEB notes are available in epic to review.  Patient/Guardian was advised Release of Information must be obtained prior to any record release in order to  collaborate their care with an outside provider. Patient/Guardian was advised if they have not already done so to contact the registration department to sign all necessary forms in order for [place] to release information regarding their care.   Consent: Patient/Guardian gives verbal consent for treatment and assignment of benefits for services provided during this visit. Patient/Guardian expressed understanding and agreed to proceed.    I provided 21 minutes of non-face-to-face time during this encounter.   Cleotis Nipper, MD

## 2022-02-02 NOTE — Telephone Encounter (Signed)
Writer spoke with Anne Hayden per Dr. [name] regarding possibly startling Ketamine treatments. Writer advised no referral necessary as Anne Hayden may call various centers for information and pricing. Anne Hayden most interested in [facility]. Writer advised Anne Hayden of several other centers and informed that [facility] offers initial free consultation and pricing of $500 per tx. Anne Hayden will call for consultation she says. Anne Hayden to return for f/u on 05/04/22. Anne Hayden says she will call office to let us know if she will get the treatments and results.

## 2022-02-02 NOTE — Progress Notes (Signed)
Patient ID: Anne Hayden, female  DOB: 10/25/1970, 51 y.o.   MRN: [id number] Patient Care Team    Relationship Specialty Notifications Start End  Natalia Leatherwood, DO PCP - General Family Medicine  01/26/18   Cleotis Nipper, MD Consulting Physician Psychiatry  01/27/18   Charlott Rakes, MD Consulting Physician Gastroenterology  01/25/20   Cherlyn Roberts, MD Referring Physician Dermatology  01/25/20   Genia Del, MD Consulting Physician Obstetrics and Gynecology  01/25/20     Chief Complaint  Patient presents with   Annual Exam    Pt is not fasting     Subjective: Anne Hayden is a 51 y.o.  Female  present for CPE. All past medical history, surgical history, allergies, family history, immunizations, medications and social history were updated in the electronic medical record today. All recent labs, ED visits and hospitalizations within the last year were reviewed.  Health maintenance:  Colonoscopy: Patient reports colonoscopy completed August 2020 by Dr. Bosie Clos, follow up 10 years Mammogram: completed:scheduled 02/10/2022 at breast center in [facility]   Cervical cancer screening: last pap: UTD 2023 Dr. Acey Lav Immunizations: tdap UTD 07/2015, Influenza UTD 2023 (encouraged yearly).  Covid series completed Infectious disease screening: HIV completed 2017, Hep C patient declined screening today. DEXA: routine screen. Assistive device: none Oxygen Anne Hayden Patient has a Dental home. Hospitalizations/ED visits: reviewed      01/25/2020    9:55 AM 01/26/2018   11:07 AM  Depression screen PHQ 2/9  Decreased Interest 0 0  Down, Depressed, Hopeless 1 2  PHQ - 2 Score 1 2  Altered sleeping 0 1  Tired, decreased energy 0 1  Change in appetite 0 0  Feeling bad or failure about yourself  0 2  Trouble concentrating 3 1  Moving slowly or fidgety/restless 0 0  Suicidal thoughts 0 0  PHQ-9 Score 4 7  Difficult doing work/chores Not difficult at all        01/25/2020    9:56 AM  GAD 7 : Generalized Anxiety Score  Nervous, Anxious, on Edge 3  Control/stop worrying 0  Worry too much - different things 0  Trouble relaxing 1  Restless 0  Easily annoyed or irritable 1  Afraid - awful might happen 0  Total GAD 7 Score 5  Anxiety Difficulty Not difficult at all    Immunization History  Administered Date(s) Administered   Influenza,inj,quad, With Preservative 05/13/2017   Moderna Sars-Covid-2 Vaccination 10/08/2019, 11/05/2019   Tdap 07/14/2015     Past Medical History:  Diagnosis Date   Allergy    Depression with anxiety    est. with psychiatry, Dr. [name]. In the past she reports she has been told she was bipolar and OCD   OCD (obsessive compulsive disorder)    Osteoarthritis    Vitamin D deficiency    No Known Allergies Past Surgical History:  Procedure Laterality Date   CERVICAL BIOPSY  10/08/2016   CIN I-low grade   COLPOSCOPY  2004,2005,2012,2018   TOE SURGERY Right    Family History  Problem Relation Age of Onset   Arthritis Mother    Hypertension Mother    Heart disease Father    Stroke Father    Polycythemia Father    Breast cancer Sister 43   Breast cancer Maternal Grandmother 83   Melanoma Maternal Grandfather    COPD Maternal Grandfather    Cancer Maternal Grandfather        melanoma   Diabetes Paternal Grandmother  Heart disease Paternal Grandfather    Bipolar disorder Paternal Uncle    Social History   Social History Narrative   Married. Husband lives in [place] through the week. She recently moved to [place] to be closer to her family.    Masters of Arts degree. Unemployed.     Allergies as of 02/02/2022   No Known Allergies      Medication List        Accurate as of February 02, 2022  3:35 PM. If you have any questions, ask your nurse or doctor.          Balziva 0.4-35 MG-MCG tablet Generic drug: norethindrone-ethinyl estradiol Take 1 tablet by mouth daily.   bimatoprost 0.03 % ophthalmic  solution Commonly known as: LATISSE APPLY TO AFFECTED AREA EVERY DAY AS DIRECTED   citalopram 40 MG tablet Commonly known as: CELEXA Take 1 tablet (40 mg total) by mouth daily.   clindamycin 1 % lotion Commonly known as: CLEOCIN T clindamycin 1 % lotion   clonazePAM 0.5 MG tablet Commonly known as: KLONOPIN Take one tab twice daily and 3rd as needed for severe anxiety   ketoconazole 2 % shampoo Commonly known as: NIZORAL ketoconazole 2 % shampoo   Lybalvi 5-10 MG Tabs Generic drug: OLANZapine-Samidorphan Take 10 mg by mouth daily in the afternoon.   Sod Fluoride-Potassium Nitrate 1.1-5 % Pste PreviDent 5000 Sensitive 1.1 %-5 % dental paste   tretinoin 0.1 % cream Commonly known as: RETIN-A Apply topically at bedtime.   VITAMIN D PO Take by mouth.        All past medical history, surgical history, allergies, family history, immunizations andmedications were updated in the EMR today and reviewed under the history and medication portions of their EMR.     Recent Results (from the past 2160 hour(s))  Cytology - PAP( [facility])     Status: None   Collection Time: 11/10/21 10:42 AM  Result Value Ref Range   High risk HPV Negative    Adequacy      Satisfactory for evaluation; transformation zone component PRESENT.   Diagnosis      - Negative for intraepithelial lesion or malignancy (NILM)   Comment Normal Reference Range HPV - Negative     No results found.   ROS 14 pt review of systems performed and negative (unless mentioned in an HPI)  Objective: BP 98/62   Pulse 77   Temp 98.1 F (36.7 C) (Oral)   Ht 5' 1.75" (1.568 m)   Wt 101 lb (45.8 kg)   LMP 01/06/2022   SpO2 97%   BMI 18.62 kg/m  Physical Exam Vitals and nursing note reviewed.  Constitutional:      General: She is not in acute distress.    Appearance: Normal appearance. She is not ill-appearing or toxic-appearing.  HENT:     Head: Normocephalic and atraumatic.     Right Ear: Tympanic  membrane, ear canal and external ear normal. There is no impacted cerumen.     Left Ear: Tympanic membrane, ear canal and external ear normal. There is no impacted cerumen.     Nose: No congestion or rhinorrhea.     Mouth/Throat:     Mouth: Mucous membranes are moist.     Pharynx: Oropharynx is clear. No oropharyngeal exudate or posterior oropharyngeal erythema.  Eyes:     General: No scleral icterus.       Right eye: No discharge.        Left eye: No discharge.  Extraocular Movements: Extraocular movements intact.     Conjunctiva/sclera: Conjunctivae normal.     Pupils: Pupils are equal, round, and reactive to light.  Cardiovascular:     Rate and Rhythm: Normal rate and regular rhythm.     Pulses: Normal pulses.     Heart sounds: Normal heart sounds. No murmur heard.    No friction rub. No gallop.  Pulmonary:     Effort: Pulmonary effort is normal. No respiratory distress.     Breath sounds: Normal breath sounds. No stridor. No wheezing, rhonchi or rales.  Chest:     Chest wall: No tenderness.  Abdominal:     General: Abdomen is flat. Bowel sounds are normal. There is no distension.     Palpations: Abdomen is soft. There is no mass.     Tenderness: There is no abdominal tenderness. There is no right CVA tenderness, left CVA tenderness, guarding or rebound.     Hernia: No hernia is present.  Musculoskeletal:        General: No swelling, tenderness or deformity. Normal range of motion.     Cervical back: Normal range of motion and neck supple. No rigidity or tenderness.     Right lower leg: No edema.     Left lower leg: No edema.  Lymphadenopathy:     Cervical: No cervical adenopathy.  Skin:    General: Skin is warm and dry.     Coloration: Skin is not jaundiced or pale.     Findings: No bruising, erythema, lesion or rash.  Neurological:     General: No focal deficit present.     Mental Status: She is alert and oriented to person, place, and time. Mental status is at  baseline.     Cranial Nerves: No cranial nerve deficit.     Sensory: No sensory deficit.     Motor: No weakness.     Coordination: Coordination normal.     Gait: Gait normal.     Deep Tendon Reflexes: Reflexes normal.  Psychiatric:        Mood and Affect: Mood normal.        Behavior: Behavior normal.        Thought Content: Thought content normal.        Judgment: Judgment normal.      No results found.  Assessment/plan: Anne Hayden is a 51 y.o. female present for CPE Encounter for long-term current use of medication - CBC - Comprehensive metabolic panel - Hemoglobin A1c - Lipid panel - TSH  Vitamin D deficiency - VITAMIN D 25 Hydroxy (Vit-D Deficiency, Fractures) - taking supplement  Routine general medical examination at a health care facility Colonoscopy: Patient reports colonoscopy completed August 2020 by Dr. Bosie Clos, follow up 10 years Mammogram: completed:scheduled 02/10/2022 at breast center in [place]  order by PCP Cervical cancer screening: last pap: 07/19/2018 Dr. Acey Lav Immunizations: tdap UTD 07/2015, Influenza UTD 2023 (encouraged yearly).  Covid series completed Infectious disease screening: HIV completed 2017, Hep C patient declined screening today. DEXA: routine screen. Patient was encouraged to exercise greater than 150 minutes a week. Patient was encouraged to choose a diet filled with fresh fruits and vegetables, and lean meats. AVS provided to patient today for education/recommendation on gender specific health and safety maintenance.  Return in about 1 year (around 02/04/2023) for cpe (20 min).   Orders Placed This Encounter  Procedures   CBC   Comprehensive metabolic panel   Hemoglobin A1c   Lipid panel   TSH  VITAMIN D 25 Hydroxy (Vit-D Deficiency, Fractures)   No orders of the defined types were placed in this encounter.  Referral Orders  No referral(s) requested today     Electronically signed by: Felix Pacini,  DO [facility] [place]

## 2022-02-02 NOTE — Patient Instructions (Signed)
No follow-ups on file.        Great to see you today.  I have refilled the medication(s) we provide.   If labs were collected, we will inform you of lab results once received either by echart message or telephone call.   - echart message- for normal results that have been seen by the patient already.   - telephone call: abnormal results or if patient has not viewed results in their echart.  Health Maintenance, Female Adopting a healthy lifestyle and getting preventive care are important in promoting health and wellness. Ask your health care provider about: The right schedule for you to have regular tests and exams. Things you can do on your own to prevent diseases and keep yourself healthy. What should I know about diet, weight, and exercise? Eat a healthy diet  Eat a diet that includes plenty of vegetables, fruits, low-fat dairy products, and lean protein. Do not eat a lot of foods that are high in solid fats, added sugars, or sodium. Maintain a healthy weight Body mass index (BMI) is used to identify weight problems. It estimates body fat based on height and weight. Your health care provider can help determine your BMI and help you achieve or maintain a healthy weight. Get regular exercise Get regular exercise. This is one of the most important things you can do for your health. Most adults should: Exercise for at least 150 minutes each week. The exercise should increase your heart rate and make you sweat (moderate-intensity exercise). Do strengthening exercises at least twice a week. This is in addition to the moderate-intensity exercise. Spend less time sitting. Even light physical activity can be beneficial. Watch cholesterol and blood lipids Have your blood tested for lipids and cholesterol at 50 years of age, then have this test every 5 years. Have your cholesterol levels checked more often if: Your lipid or cholesterol levels are high. You are older than 50 years of  age. You are at high risk for heart disease. What should I know about cancer screening? Depending on your health history and family history, you may need to have cancer screening at various ages. This may include screening for: Breast cancer. Cervical cancer. Colorectal cancer. Skin cancer. Lung cancer. What should I know about heart disease, diabetes, and high blood pressure? Blood pressure and heart disease High blood pressure causes heart disease and increases the risk of stroke. This is more likely to develop in people who have high blood pressure readings or are overweight. Have your blood pressure checked: Every 3-5 years if you are 18-39 years of age. Every year if you are 40 years old or older. Diabetes Have regular diabetes screenings. This checks your fasting blood sugar level. Have the screening done: Once every three years after age 40 if you are at a normal weight and have a low risk for diabetes. More often and at a younger age if you are overweight or have a high risk for diabetes. What should I know about preventing infection? Hepatitis B If you have a higher risk for hepatitis B, you should be screened for this virus. Talk with your health care provider to find out if you are at risk for hepatitis B infection. Hepatitis C Testing is recommended for: Everyone born from 1945 through 1965. Anyone with known risk factors for hepatitis C. Sexually transmitted infections (STIs) Get screened for STIs, including gonorrhea and chlamydia, if: You are sexually active and are younger than 50 years of age. You are   older than 50 years of age and your health care provider tells you that you are at risk for this type of infection. Your sexual activity has changed since you were last screened, and you are at increased risk for chlamydia or gonorrhea. Ask your health care provider if you are at risk. Ask your health care provider about whether you are at high risk for HIV. Your health  care provider may recommend a prescription medicine to help prevent HIV infection. If you choose to take medicine to prevent HIV, you should first get tested for HIV. You should then be tested every 3 months for as long as you are taking the medicine. Pregnancy If you are about to stop having your period (premenopausal) and you may become pregnant, seek counseling before you get pregnant. Take 400 to 800 micrograms (mcg) of folic acid every day if you become pregnant. Ask for birth control (contraception) if you want to prevent pregnancy. Osteoporosis and menopause Osteoporosis is a disease in which the bones lose minerals and strength with aging. This can result in bone fractures. If you are 65 years old or older, or if you are at risk for osteoporosis and fractures, ask your health care provider if you should: Be screened for bone loss. Take a calcium or vitamin D supplement to lower your risk of fractures. Be given hormone replacement therapy (HRT) to treat symptoms of menopause. Follow these instructions at home: Alcohol use Do not drink alcohol if: Your health care provider tells you not to drink. You are pregnant, may be pregnant, or are planning to become pregnant. If you drink alcohol: Limit how much you have to: 0-1 drink a day. Know how much alcohol is in your drink. In the [place] one drink equals one 12 oz bottle of beer (355 mL), one 5 oz glass of wine (148 mL), or one 1 oz glass of hard liquor (44 mL). Lifestyle Do not use any products that contain nicotine or tobacco. These products include cigarettes, chewing tobacco, and vaping devices, such as e-cigarettes. If you need help quitting, ask your health care provider. Do not use street drugs. Do not share needles. Ask your health care provider for help if you need support or information about quitting drugs. General instructions Schedule regular health, dental, and eye exams. Stay current with your vaccines. Tell your health  care provider if: You often feel depressed. You have ever been abused or do not feel safe at home. Summary Adopting a healthy lifestyle and getting preventive care are important in promoting health and wellness. Follow your health care provider's instructions about healthy diet, exercising, and getting tested or screened for diseases. Follow your health care provider's instructions on monitoring your cholesterol and blood pressure. This information is not intended to replace advice given to you by your health care provider. Make sure you discuss any questions you have with your health care provider. Document Revised: 11/18/2020 Document Reviewed: 11/18/2020 Elsevier Patient Education  2023 Elsevier Inc.  

## 2022-02-03 ENCOUNTER — Telehealth (HOSPITAL_COMMUNITY): Payer: Self-pay | Admitting: *Deleted

## 2022-02-03 ENCOUNTER — Telehealth: Payer: Self-pay

## 2022-02-03 LAB — COMPREHENSIVE METABOLIC PANEL
AG Ratio: 1.6 (calc) (ref 1.0–2.5)
ALT: 9 U/L (ref 6–29)
AST: 14 U/L (ref 10–35)
Albumin: 3.9 g/dL (ref 3.6–5.1)
Alkaline phosphatase (APISO): 68 U/L (ref 31–125)
BUN: 9 mg/dL (ref 7–25)
CO2: 22 mmol/L (ref 20–32)
Calcium: 8.8 mg/dL (ref 8.6–10.2)
Chloride: 104 mmol/L (ref 98–110)
Creat: 0.7 mg/dL (ref 0.50–0.99)
Globulin: 2.5 g/dL (calc) (ref 1.9–3.7)
Glucose, Bld: 132 mg/dL — ABNORMAL HIGH (ref 65–99)
Potassium: 4.1 mmol/L (ref 3.5–5.3)
Sodium: 138 mmol/L (ref 135–146)
Total Bilirubin: 0.4 mg/dL (ref 0.2–1.2)
Total Protein: 6.4 g/dL (ref 6.1–8.1)

## 2022-02-03 LAB — CBC
HCT: 39.9 % (ref 35.0–45.0)
Hemoglobin: 13.3 g/dL (ref 11.7–15.5)
MCH: 29.8 pg (ref 27.0–33.0)
MCHC: 33.3 g/dL (ref 32.0–36.0)
MCV: 89.3 fL (ref 80.0–100.0)
MPV: 10.1 fL (ref 7.5–12.5)
Platelets: 366 10*3/uL (ref 140–400)
RBC: 4.47 10*6/uL (ref 3.80–5.10)
RDW: 12.6 % (ref 11.0–15.0)
WBC: 4.7 10*3/uL (ref 3.8–10.8)

## 2022-02-03 LAB — LIPID PANEL
Cholesterol: 160 mg/dL (ref <200)
HDL: 50 mg/dL (ref >=50)
LDL Cholesterol (Calc): 93 mg/dL (calc)
Non-HDL Cholesterol (Calc): 110 mg/dL (calc) (ref <130)
Total CHOL/HDL Ratio: 3.2 (calc) (ref <5.0)
Triglycerides: 81 mg/dL (ref <150)

## 2022-02-03 LAB — VITAMIN D 25 HYDROXY (VIT D DEFICIENCY, FRACTURES): Vit D, 25-Hydroxy: 65 ng/mL (ref 30–100)

## 2022-02-03 LAB — TSH: TSH: 2.23 mIU/L

## 2022-02-03 LAB — HEMOGLOBIN A1C
Hgb A1c MFr Bld: 5 % of total Hgb (ref <5.7)
Mean Plasma Glucose: 97 mg/dL
eAG (mmol/L): 5.4 mmol/L

## 2022-02-03 NOTE — Telephone Encounter (Signed)
Thanks for update

## 2022-02-03 NOTE — Telephone Encounter (Signed)
Patient called back regarding test results.  She stated she viewed them all online and they looked very good.  I read to her Dr. Alan Ripper comment:  copied and pasted Please call patient Liver, kidney and thyroid function are normal Blood cell counts and electrolytes are normal Diabetes screening/A1c is normal  Vit d is great at 66 Cholesterol panel is really good and at goal for her.  She was very happy.

## 2022-02-03 NOTE — Telephone Encounter (Signed)
Referral for Sravato tx sent to [facility]. LVM for pt. Pt encouraged to call with any questions or concerns.

## 2022-02-10 ENCOUNTER — Ambulatory Visit
Admission: RE | Admit: 2022-02-10 | Discharge: 2022-02-10 | Disposition: A | Discharge status: Home / Self Care | Payer: PRIVATE HEALTH INSURANCE | Source: Ambulatory Visit | Attending: Obstetrics & Gynecology | Admitting: Obstetrics & Gynecology

## 2022-02-10 DIAGNOSIS — Z1231 Encounter for screening mammogram for malignant neoplasm of breast: Secondary | ICD-10-CM

## 2022-02-12 ENCOUNTER — Other Ambulatory Visit: Payer: Self-pay | Admitting: Obstetrics & Gynecology

## 2022-02-12 DIAGNOSIS — R928 Other abnormal and inconclusive findings on diagnostic imaging of breast: Secondary | ICD-10-CM

## 2022-02-19 ENCOUNTER — Ambulatory Visit
Admission: RE | Admit: 2022-02-19 | Discharge: 2022-02-19 | Disposition: A | Discharge status: Home / Self Care | Payer: 59 | Source: Ambulatory Visit | Attending: Obstetrics & Gynecology | Admitting: Obstetrics & Gynecology

## 2022-02-19 DIAGNOSIS — R928 Other abnormal and inconclusive findings on diagnostic imaging of breast: Secondary | ICD-10-CM

## 2022-03-02 ENCOUNTER — Telehealth (HOSPITAL_COMMUNITY): Payer: Self-pay | Admitting: *Deleted

## 2022-03-02 NOTE — Telephone Encounter (Signed)
Patient called stated she hasn't heard anything back about starting Ketamine treatments

## 2022-03-13 ENCOUNTER — Telehealth (HOSPITAL_COMMUNITY): Payer: Self-pay | Admitting: *Deleted

## 2022-03-13 NOTE — Telephone Encounter (Signed)
Writer left VM reminding pt that we need her to come in and sign Spravato REMS forms so as to be able to send referral for tx to [facility].

## 2022-03-18 ENCOUNTER — Telehealth (HOSPITAL_COMMUNITY): Payer: Self-pay | Admitting: *Deleted

## 2022-03-18 NOTE — Telephone Encounter (Signed)
Pt in office to sign Spravato REMS consent. Referral then faxed to [facility] for possible Ketamine therapy.

## 2022-03-31 ENCOUNTER — Telehealth (HOSPITAL_COMMUNITY): Payer: Self-pay | Admitting: *Deleted

## 2022-03-31 NOTE — Telephone Encounter (Signed)
Received call for Dr Levonne Spiller office calling to inform & state that patient has been approves  & that they haven't be able to make contact. I reached to patient  & she stated that she had been in [place] with husband  & she will f/u back up

## 2022-03-31 NOTE — Telephone Encounter (Deleted)
Received call drom Dr Levonne Spiller office stating & inform that patient has been approved & that they haven't been able to make contact. Called # & patient stated she has been in [place] with her husband & that she would have to get back in touch.

## 2022-05-04 ENCOUNTER — Encounter (HOSPITAL_COMMUNITY): Payer: Self-pay | Admitting: Psychiatry

## 2022-05-04 ENCOUNTER — Telehealth (HOSPITAL_BASED_OUTPATIENT_CLINIC_OR_DEPARTMENT_OTHER): Payer: 59 | Admitting: Psychiatry

## 2022-05-04 DIAGNOSIS — F429 Obsessive-compulsive disorder, unspecified: Secondary | ICD-10-CM

## 2022-05-04 DIAGNOSIS — F331 Major depressive disorder, recurrent, moderate: Secondary | ICD-10-CM

## 2022-05-04 MED ORDER — CLONAZEPAM 0.5 MG PO TABS: ORAL_TABLET | ORAL | 2 refills | Status: DC

## 2022-05-04 MED ORDER — CITALOPRAM HYDROBROMIDE 40 MG PO TABS: 40.0000 mg | ORAL_TABLET | Freq: Every day | ORAL | 2 refills | Status: DC

## 2022-05-04 NOTE — Progress Notes (Signed)
Virtual Visit via Telephone Note  I connected with Anne Hayden on 05/04/22 at  2:20 PM EDT by telephone and verified that I am speaking with the correct person using two identifiers.  Location: Patient: Home Provider: Home Office   I discussed the limitations, risks, security and privacy concerns of performing an evaluation and management service by telephone and the availability of in person appointments. I also discussed with the patient that there may be a patient responsible charge related to this service. The patient expressed understanding and agreed to proceed.   History of Present Illness: Patient is evaluated by phone session.  She has so far 6 spartvato treatment and she noticed some improvement in her anxiety and morning depression.  She also feels motivated to do things and did not have negative thoughts.  However she still struggles from time with energy and chronic depression.  Now she is thinking to do maintenance treatment so she could be due to feel stable and may be better.  She is happy that finally her husband is relocating back to [place] next fall.  She is also going in few days to see her husband in [place].  After that husband may go to [place] for a few weeks for work.  She started walking every day and that helps her.  She denies any suicidal thoughts or homicidal thoughts.  She is taking citalopram and Klonopin.  Her sleep is okay.  Her appetite is okay.  Her weight is unchanged from the past.  Recently she had a blood work and her hemoglobin A1c is normal.  She reported her OCD and anxiety is there but not worsening.  She denies any hallucinations or any paranoia.    Past Psychiatric History:  H/O anxiety, OCD, mania and depression. H/O inpatient at [facility] in 2010. No h/o suicidal attempt.  Seroquel cause weight gain, Risperdal caused nausea, Trintellix cause itching, Geodon, Neurontin, Paxil and Abilify did not help. Took Lamictal worked in [place]  but did not work in [place]. Wellbutrin caused loss of taste. ECT helped.    No results found for this or any previous visit (from the past 2160 hour(s)).    Psychiatric Specialty Exam: Physical Exam  Review of Systems  Weight 104 lb (47.2 kg).There is no height or weight on file to calculate BMI.  General Appearance: NA  Eye Contact:  NA  Speech:  Slow  Volume:  Decreased  Mood:  Dysphoric  Affect:  NA  Thought Process:  Descriptions of Associations: Intact  Orientation:  Full (Time, Place, and Person)  Thought Content:  Rumination  Suicidal Thoughts:  No  Homicidal Thoughts:  No  Memory:  Immediate;   Good Recent;   Good Remote;   Fair  Judgement:  Fair  Insight:  Shallow  Psychomotor Activity:  NA  Concentration:  Concentration: Fair and Attention Span: Fair  Recall:  Good  Fund of Knowledge:  Good  Language:  Good  Akathisia:  No  Handed:  Right  AIMS (if indicated):     Assets:  Communication Skills Desire for Improvement Housing Transportation  ADL's:  Intact  Cognition:  WNL  Sleep:   fair      Assessment and Plan: Obsessive compulsive disorder.  Major depressive disorder, recurrent.  Patient received so far 6 treatment of the Spravato and now hoping to get maintenance treatment since she noticed marginal improvement.  Patient reported it is expensive as $200 per visit but hoping to continue to  avoid decompensation.  She also like to continue Celexa and Klonopin.  I reviewed blood work results which was done in July.  Her hemoglobin A1c is normal.  She is hoping husband relocate back to [place] next fall.  She is also planning to visit him in few days.  I recommend to call us back if she has any question of any concern.  Continue current treatment.  Follow-up in 3 months.  Follow Up Instructions:    I discussed the assessment and treatment plan with the patient. The patient was provided an opportunity to ask questions and all were answered. The patient  agreed with the plan and demonstrated an understanding of the instructions.   The patient was advised to call back or seek an in-person evaluation if the symptoms worsen or if the condition fails to improve as anticipated.  Collaboration of Care: Other provider involved in patient's care AEB Notes are in epic to review.   Patient/Guardian was advised Release of Information must be obtained prior to any record release in order to collaborate their care with an outside provider. Patient/Guardian was advised if they have not already done so to contact the registration department to sign all necessary forms in order for [place] to release information regarding their care.   Consent: Patient/Guardian gives verbal consent for treatment and assignment of benefits for services provided during this visit. Patient/Guardian expressed understanding and agreed to proceed.    I provided 17 minutes of non-face-to-face time during this encounter.   Kathlee Nations, MD

## 2022-08-03 ENCOUNTER — Encounter (HOSPITAL_COMMUNITY): Payer: Self-pay | Admitting: Psychiatry

## 2022-08-03 ENCOUNTER — Telehealth (HOSPITAL_BASED_OUTPATIENT_CLINIC_OR_DEPARTMENT_OTHER): Payer: 59 | Admitting: Psychiatry

## 2022-08-03 VITALS — Wt 104.0 lb

## 2022-08-03 DIAGNOSIS — F331 Major depressive disorder, recurrent, moderate: Secondary | ICD-10-CM | POA: Diagnosis not present

## 2022-08-03 DIAGNOSIS — F429 Obsessive-compulsive disorder, unspecified: Secondary | ICD-10-CM | POA: Diagnosis not present

## 2022-08-03 MED ORDER — FLUVOXAMINE MALEATE 25 MG PO TABS: 25.0000 mg | ORAL_TABLET | Freq: Every day | ORAL | 1 refills | Status: DC

## 2022-08-03 MED ORDER — CLONAZEPAM 0.5 MG PO TABS: ORAL_TABLET | ORAL | 1 refills | Status: DC

## 2022-08-03 NOTE — Progress Notes (Signed)
Virtual Visit via Telephone Note  I connected with Anne Hayden on 08/03/22 at  2:00 PM EST by telephone and verified that I am speaking with the correct person using two identifiers.  Location: Patient: Home Provider: Home office   I discussed the limitations, risks, security and privacy concerns of performing an evaluation and management service by telephone and the availability of in person appointments. I also discussed with the patient that there may be a patient responsible charge related to this service. The patient expressed understanding and agreed to proceed.   History of Present Illness: Patient is evaluated by phone session.  She just came back last night from [place] after visiting her husband.  She admitted not getting maintenance Spravato treatment and noticed lately more anxious, sad, isolated and withdrawn.  She has a lot of anxiety and nervousness sometimes she obsess about certain things.  Though she denies any suicidal thoughts or homicidal thoughts but admitted chronic fatigue, lack of motivation to do things.  Her appetite is okay.  Her weight is unchanged from the past.  She is not sure if her husband go to [place] but there is a chance that he will start working in [place] in this fall.  She has no tremors, shakes or any EPS.  Today she is tired as she has came last night from [place].  She denies any hallucination or any paranoia.  Past Psychiatric History:  H/O anxiety, OCD, mania and depression. H/O inpatient at [facility] in 2010. No h/o suicidal attempt.  Seroquel cause weight gain, Risperdal caused nausea, Trintellix cause itching, Geodon, Neurontin, Paxil, Tofranil and Abilify did not help. Took Lamictal worked in [place] but did not work in [place]. Wellbutrin caused loss of taste. ECT helped.     Psychiatric Specialty Exam: Physical Exam  Review of Systems  Weight 104 lb (47.2 kg).Body mass index is 19.18 kg/m.  General Appearance: NA  Eye  Contact:  NA  Speech:  Slow  Volume:  Decreased  Mood:  Anxious  Affect:  NA  Thought Process:  Goal Directed  Orientation:  Full (Time, Place, and Person)  Thought Content:  Rumination  Suicidal Thoughts:  No  Homicidal Thoughts:  No  Memory:  Immediate;   Good Recent;   Good Remote;   Good  Judgement:  Intact  Insight:  Present  Psychomotor Activity:  NA  Concentration:  Concentration: Fair and Attention Span: Fair  Recall:  Good  Fund of Knowledge:  Good  Language:  Good  Akathisia:  No  Handed:  Right  AIMS (if indicated):     Assets:  Communication Skills Desire for Improvement Housing Social Support Transportation  ADL's:  Intact  Cognition:  WNL  Sleep:   fair      Assessment and Plan: Obsessive-compulsive disorder.  Major depressive disorder, recurrent.  Patient is not getting the Spravato treatment anymore.  She admitted it helped but still not sure it is worth to get the maintenance treatment.  We discussed trying the Luvox which she has never tried before.  Patient agreed to give a try.  I recommend to cut down her Celexa 20 mg for a week and then stop.  She will try Luvox 25 mg at bedtime and Klonopin 0.5 mg 2-3 times a day.  Discussed medication side effects and benefits.  Patient like to have appointment in 2 months and if Luvox did help then she will call the refill otherwise she will go back to  Celexa 40 mg daily.  I encouraged to call us back to give [place] a update.  Follow-up in 2 months.  Follow Up Instructions:    I discussed the assessment and treatment plan with the patient. The patient was provided an opportunity to ask questions and all were answered. The patient agreed with the plan and demonstrated an understanding of the instructions.   The patient was advised to call back or seek an in-person evaluation if the symptoms worsen or if the condition fails to improve as anticipated.  Collaboration of Care: Other provider involved in patient's care  AEB notes are available in epic to review.  Patient/Guardian was advised Release of Information must be obtained prior to any record release in order to collaborate their care with an outside provider. Patient/Guardian was advised if they have not already done so to contact the registration department to sign all necessary forms in order for [place] to release information regarding their care.   Consent: Patient/Guardian gives verbal consent for treatment and assignment of benefits for services provided during this visit. Patient/Guardian expressed understanding and agreed to proceed.    I provided 18 minutes of non-face-to-face time during this encounter.   Kathlee Nations, MD

## 2022-08-25 ENCOUNTER — Other Ambulatory Visit (HOSPITAL_COMMUNITY): Payer: Self-pay | Admitting: Psychiatry

## 2022-08-25 DIAGNOSIS — F429 Obsessive-compulsive disorder, unspecified: Secondary | ICD-10-CM

## 2022-08-25 DIAGNOSIS — F331 Major depressive disorder, recurrent, moderate: Secondary | ICD-10-CM

## 2022-09-04 ENCOUNTER — Other Ambulatory Visit (HOSPITAL_COMMUNITY): Payer: Self-pay | Admitting: Psychiatry

## 2022-09-04 DIAGNOSIS — F331 Major depressive disorder, recurrent, moderate: Secondary | ICD-10-CM

## 2022-09-04 DIAGNOSIS — F429 Obsessive-compulsive disorder, unspecified: Secondary | ICD-10-CM

## 2022-09-29 ENCOUNTER — Other Ambulatory Visit (HOSPITAL_COMMUNITY): Payer: Self-pay | Admitting: Psychiatry

## 2022-09-29 DIAGNOSIS — F429 Obsessive-compulsive disorder, unspecified: Secondary | ICD-10-CM

## 2022-09-29 DIAGNOSIS — F331 Major depressive disorder, recurrent, moderate: Secondary | ICD-10-CM

## 2022-10-02 ENCOUNTER — Telehealth (HOSPITAL_BASED_OUTPATIENT_CLINIC_OR_DEPARTMENT_OTHER): Payer: 59 | Admitting: Psychiatry

## 2022-10-02 ENCOUNTER — Encounter (HOSPITAL_COMMUNITY): Payer: Self-pay | Admitting: Psychiatry

## 2022-10-02 DIAGNOSIS — F331 Major depressive disorder, recurrent, moderate: Secondary | ICD-10-CM | POA: Diagnosis not present

## 2022-10-02 DIAGNOSIS — F429 Obsessive-compulsive disorder, unspecified: Secondary | ICD-10-CM

## 2022-10-02 MED ORDER — FLUVOXAMINE MALEATE 50 MG PO TABS: 50.0000 mg | ORAL_TABLET | Freq: Every day | ORAL | 1 refills | Status: DC

## 2022-10-02 MED ORDER — CLONAZEPAM 0.5 MG PO TABS: ORAL_TABLET | ORAL | 1 refills | Status: DC

## 2022-10-02 NOTE — Progress Notes (Signed)
[facility] MD Virtual Progress Note   Patient Location: Home Provider Location: Home Office  I connect with patient by telephone and verified that I am speaking with correct person by using two identifiers. I discussed the limitations of evaluation and management by telemedicine and the availability of in person appointments. I also discussed with the patient that there may be a patient responsible charge related to this service. The patient expressed understanding and agreed to proceed.  Anne Hayden [id number] 52 y.o.  10/02/2022 10:19 AM  History of Present Illness:  Patient is evaluated by phone session.  She is currently in [place] visiting her husband.  She reported having cold symptoms with stuffy nose.  She did not sleep last night.  We started her on Luvox on the last visit and she is taking 25 mg however has not seen a significant improvement but also not seen significant side effects.  Anxiety remains unchanged from the past.  She also had 3 maintenance treatment of Spravato but no significant change.  Last treatment was 2 weeks ago.  Now she is thinking may consider for IV Spravato but need to consider the cost as it is expensive.  She still have obsessive thoughts and feels very nervous and anxious around people.  She started to have sweaty hands.  She denies any suicidal thoughts but sometimes she has ruminative thoughts, intrusive thoughts and obsessive thoughts about religion and if she is doing any sin and go to hell.  She denies any hallucination, paranoia.  She excited because husband may moved in September to [place].  She admitted few pounds weight loss but believes this is her baseline.  She has no tremors, shakes or any EPS.  She is doing more frequent trips to [place] to see her husband.  She denies drinking or using any illegal substances.  Past Psychiatric History: H/O anxiety, OCD, mania and depression. H/O inpatient at [facility] in  2010. No h/o suicidal attempt.  Seroquel cause weight gain, Risperdal caused nausea, Trintellix cause itching, Geodon, Neurontin, Paxil, Tofranil and Abilify did not help. Took Lamictal worked in [place] but did not work in [place]. Wellbutrin caused loss of taste. ECT helped.      Outpatient Encounter Medications as of 10/02/2022  Medication Sig   bimatoprost (LATISSE) 0.03 % ophthalmic solution APPLY TO AFFECTED AREA EVERY DAY AS DIRECTED   citalopram (CELEXA) 40 MG tablet Take 1 tablet (40 mg total) by mouth daily. (Patient taking differently: Take 20 mg by mouth daily.)   clindamycin (CLEOCIN T) 1 % lotion clindamycin 1 % lotion   clonazePAM (KLONOPIN) 0.5 MG tablet Take one tab twice daily and 3rd as needed for severe anxiety   fluvoxaMINE (LUVOX) 25 MG tablet Take 1 tablet (25 mg total) by mouth daily.   ketoconazole (NIZORAL) 2 % shampoo ketoconazole 2 % shampoo   norethindrone-ethinyl estradiol (BALZIVA) 0.4-35 MG-MCG tablet Take 1 tablet by mouth daily.   Sod Fluoride-Potassium Nitrate 1.1-5 % PSTE PreviDent 5000 Sensitive 1.1 %-5 % dental paste   tretinoin (RETIN-A) 0.1 % cream Apply topically at bedtime.   VITAMIN D PO Take by mouth.   No facility-administered encounter medications on file as of 10/02/2022.    No results found for this or any previous visit (from the past 2160 hour(s)).   Psychiatric Specialty Exam: Physical Exam  Review of Systems  Constitutional:        Cold symptoms.  Respiratory:  Stuffy nose    Weight 101 lb (45.8 kg).There is no height or weight on file to calculate BMI.  General Appearance: NA  Eye Contact:  NA  Speech:  Slow  Volume:  Decreased  Mood:  Anxious  Affect:  NA  Thought Process:  Goal Directed  Orientation:  Full (Time, Place, and Person)  Thought Content:  Obsessions and Rumination  Suicidal Thoughts:  No  Homicidal Thoughts:  No  Memory:  Immediate;   Good Recent;   Good Remote;   Good  Judgement:  Intact  Insight:   Present  Psychomotor Activity:  NA  Concentration:  Concentration: Fair and Attention Span: Fair  Recall:  Good  Fund of Knowledge:  Good  Language:  Good  Akathisia:  No  Handed:  Right  AIMS (if indicated):     Assets:  Communication Skills Desire for Improvement Housing Social Support Transportation  ADL's:  Intact  Cognition:  WNL  Sleep:  fair     Assessment/Plan: MDD (major depressive disorder), recurrent episode, moderate (HCC) - Plan: fluvoxaMINE (LUVOX) 50 MG tablet  Obsessive-compulsive disorder, unspecified type - Plan: fluvoxaMINE (LUVOX) 50 MG tablet, clonazePAM (KLONOPIN) 0.5 MG tablet  Reviewed current medication.  She is now taking Luvox 25 mg and so far has not have any side effects or also have not seen any significant improvement.  She is no longer taking Celexa.  We talk about optimizing the dose to 50 mg and she agree with the plan.  She is also considering switching from p.o. to IV Spravato treatment but need to consider the cost as it is expensive and located in [place].  She takes the Klonopin 2-3 times a day which helps her anxiety.  She is not interested in therapy.  Discussed medication side effects and benefits.  Recommended to call us back if she has any question or any concern.  Follow-up in 2 months.  If patient do not see any improvement with 50 mg of Luvox we may consider switching her back to Celexa.   Follow Up Instructions:     I discussed the assessment and treatment plan with the patient. The patient was provided an opportunity to ask questions and all were answered. The patient agreed with the plan and demonstrated an understanding of the instructions.   The patient was advised to call back or seek an in-person evaluation if the symptoms worsen or if the condition fails to improve as anticipated.    Collaboration of Care: Other provider involved in patient's care AEB notes are available in epic to review.  Patient/Guardian was advised  Release of Information must be obtained prior to any record release in order to collaborate their care with an outside provider. Patient/Guardian was advised if they have not already done so to contact the registration department to sign all necessary forms in order for [place] to release information regarding their care.   Consent: Patient/Guardian gives verbal consent for treatment and assignment of benefits for services provided during this visit. Patient/Guardian expressed understanding and agreed to proceed.     I provided 20 minutes of non face to face time during this encounter.  Kathlee Nations, MD 10/02/2022

## 2022-10-25 ENCOUNTER — Other Ambulatory Visit (HOSPITAL_COMMUNITY): Payer: Self-pay | Admitting: Psychiatry

## 2022-10-25 DIAGNOSIS — F331 Major depressive disorder, recurrent, moderate: Secondary | ICD-10-CM

## 2022-10-25 DIAGNOSIS — F429 Obsessive-compulsive disorder, unspecified: Secondary | ICD-10-CM

## 2022-12-01 ENCOUNTER — Other Ambulatory Visit (HOSPITAL_COMMUNITY): Payer: Self-pay | Admitting: Psychiatry

## 2022-12-01 DIAGNOSIS — F331 Major depressive disorder, recurrent, moderate: Secondary | ICD-10-CM

## 2022-12-01 DIAGNOSIS — F429 Obsessive-compulsive disorder, unspecified: Secondary | ICD-10-CM

## 2022-12-02 ENCOUNTER — Telehealth (HOSPITAL_BASED_OUTPATIENT_CLINIC_OR_DEPARTMENT_OTHER): Payer: 59 | Admitting: Psychiatry

## 2022-12-02 ENCOUNTER — Encounter (HOSPITAL_COMMUNITY): Payer: Self-pay | Admitting: Psychiatry

## 2022-12-02 DIAGNOSIS — F331 Major depressive disorder, recurrent, moderate: Secondary | ICD-10-CM | POA: Diagnosis not present

## 2022-12-02 DIAGNOSIS — F429 Obsessive-compulsive disorder, unspecified: Secondary | ICD-10-CM

## 2022-12-02 MED ORDER — CLONAZEPAM 0.5 MG PO TABS
ORAL_TABLET | ORAL | 1 refills | Status: DC
Start: 2022-12-02 — End: 2023-02-01

## 2022-12-02 MED ORDER — CITALOPRAM HYDROBROMIDE 40 MG PO TABS
40.0000 mg | ORAL_TABLET | Freq: Every day | ORAL | 2 refills | Status: DC
Start: 2022-12-02 — End: 2023-04-06

## 2022-12-02 NOTE — Progress Notes (Signed)
[name] Virtual Progress Note   Patient Location: Home Provider Location: Home Office  I connect with patient by telephone and verified that I am speaking with correct person by using two identifiers. I discussed the limitations of evaluation and management by telemedicine and the availability of in person appointments. I also discussed with the patient that there may be a patient responsible charge related to this service. The patient expressed understanding and agreed to proceed.  Anne Hayden [id number] 52 y.o.  12/02/2022 2:11 PM  History of Present Illness:  Patient is evaluated by phone session.  She did not like increased Luvox and she noticed more anxious, nervous and taking more often Klonopin.  She reported not able to leave the house because she is very nervous and anxious.  She also worried about finances as bills are coming from [facility].  She was thinking about IV maintenance treatment but she wants to hold until take more time.  She is going to [place] this Friday and she is very nervous because she does not like to go outside and stays at her husband's apartment all the time.  She is hoping that husband will come back and moved in September to [place].  She feels that she has more support because she does not want to bother her mother to take her to the doctor's appointment.  Now she is thinking about ECT which had helped her in the past.  She like to go back on Celexa because she noticed Luvox does not help with anxiety and in fact causes more anxiety.  She is not sleeping and having racing thoughts.  Her OCD symptoms are stable and she does not have that many rituals or obsessive thoughts.  Her appetite is okay.  Her energy level is low.  She denies drinking or using any illegal substances.  Her weight is unchanged from the past.  Past Psychiatric History: H/O anxiety, OCD, mania and depression. H/O inpatient at [facility] in  2010. No h/o suicidal attempt.  Seroquel cause weight gain, Risperdal caused nausea, Trintellix cause itching, Geodon, Neurontin, Paxil, Tofranil and Abilify did not help. Took Lamictal worked in [place] but did not work in [place]. Wellbutrin caused loss of taste. ECT helped.   We tried Luvox that did not help   Outpatient Encounter Medications as of 12/02/2022  Medication Sig   bimatoprost (LATISSE) 0.03 % ophthalmic solution APPLY TO AFFECTED AREA EVERY DAY AS DIRECTED   citalopram (CELEXA) 40 MG tablet Take 1 tablet (40 mg total) by mouth daily. (Patient taking differently: Take 20 mg by mouth daily.)   clindamycin (CLEOCIN T) 1 % lotion clindamycin 1 % lotion   clonazePAM (KLONOPIN) 0.5 MG tablet Take one tab twice daily and 3rd as needed for severe anxiety   fluvoxaMINE (LUVOX) 50 MG tablet Take 1 tablet (50 mg total) by mouth daily.   ketoconazole (NIZORAL) 2 % shampoo ketoconazole 2 % shampoo   norethindrone-ethinyl estradiol (BALZIVA) 0.4-35 MG-MCG tablet Take 1 tablet by mouth daily.   Sod Fluoride-Potassium Nitrate 1.1-5 % PSTE PreviDent 5000 Sensitive 1.1 %-5 % dental paste   tretinoin (RETIN-A) 0.1 % cream Apply topically at bedtime.   VITAMIN D PO Take by mouth.   No facility-administered encounter medications on file as of 12/02/2022.    No results found for this or any previous visit (from the past 2160 hour(s)).   Psychiatric Specialty Exam: Physical Exam  Review of Systems  Weight 101 lb (  45.8 kg).There is no height or weight on file to calculate BMI.  General Appearance: NA  Eye Contact:  NA  Speech:  Slow  Volume:  Decreased  Mood:  Anxious  Affect:  NA  Thought Process:  Descriptions of Associations: Intact  Orientation:  Full (Time, Place, and Person)  Thought Content:  Rumination  Suicidal Thoughts:  No  Homicidal Thoughts:  No  Memory:  Immediate;   Good Recent;   Good Remote;   Good  Judgement:  Intact  Insight:  Present  Psychomotor Activity:  NA   Concentration:  Concentration: Fair and Attention Span: Fair  Recall:  [employer] of Knowledge:  Fair  Language:  Good  Akathisia:  No  Handed:  Right  AIMS (if indicated):     Assets:  Communication Skills Desire for Improvement Housing Social Support Transportation  ADL's:  Intact  Cognition:  WNL  Sleep:  fair     Assessment/Plan: Obsessive-compulsive disorder, unspecified type - Plan: clonazePAM (KLONOPIN) 0.5 MG tablet, citalopram (CELEXA) 40 MG tablet  MDD (major depressive disorder), recurrent episode, moderate (HCC) - Plan: citalopram (CELEXA) 40 MG tablet  Patient like to go back on Celexa because increase Luvox made her more anxious and nervous and she started taking more Klonopin.  She reported the Celexa helped the anxiety better than Luvox.  She does not want to try a different medication.  Her OCD symptoms are stable.  At this time she is not in therapy but like to start in the future once her husband moved back to [place].  Patient told financial stress as going to [place] and is still paying the bills from previous treatment.  She also consider ECT in the future.  Discontinue Luvox.  Restart Celexa 40 mg daily.  Continue Klonopin 0.5 mg 2-3 times a day.  Discussed benzodiazepine dependence tolerance withdrawal.  Recommend to call us back if she is any question or any concern.  Follow-up in 3 months.   Follow Up Instructions:     I discussed the assessment and treatment plan with the patient. The patient was provided an opportunity to ask questions and all were answered. The patient agreed with the plan and demonstrated an understanding of the instructions.   The patient was advised to call back or seek an in-person evaluation if the symptoms worsen or if the condition fails to improve as anticipated.    Collaboration of Care: Other provider involved in patient's care AEB notes are available in epic to review.  Patient/Guardian was advised Release of  Information must be obtained prior to any record release in order to collaborate their care with an outside provider. Patient/Guardian was advised if they have not already done so to contact the registration department to sign all necessary forms in order for [place] to release information regarding their care.   Consent: Patient/Guardian gives verbal consent for treatment and assignment of benefits for services provided during this visit. Patient/Guardian expressed understanding and agreed to proceed.     I provided 18 minutes of non face to face time during this encounter.  Note: This document was prepared by [employer] voice dictation technology and any errors that results from this process are unintentional.    Cleotis Nipper, MD 12/02/2022

## 2022-12-23 ENCOUNTER — Ambulatory Visit: Payer: Self-pay | Admitting: Obstetrics & Gynecology

## 2023-01-11 ENCOUNTER — Other Ambulatory Visit: Payer: Self-pay | Admitting: Obstetrics & Gynecology

## 2023-01-11 DIAGNOSIS — Z1231 Encounter for screening mammogram for malignant neoplasm of breast: Secondary | ICD-10-CM

## 2023-01-30 ENCOUNTER — Other Ambulatory Visit: Payer: Self-pay | Admitting: Obstetrics & Gynecology

## 2023-02-01 ENCOUNTER — Other Ambulatory Visit (HOSPITAL_COMMUNITY): Payer: Self-pay | Admitting: Psychiatry

## 2023-02-01 ENCOUNTER — Telehealth (HOSPITAL_COMMUNITY): Payer: Self-pay | Admitting: *Deleted

## 2023-02-01 DIAGNOSIS — F429 Obsessive-compulsive disorder, unspecified: Secondary | ICD-10-CM

## 2023-02-01 MED ORDER — CLONAZEPAM 0.5 MG PO TABS
ORAL_TABLET | ORAL | 0 refills | Status: DC
Start: 2023-02-01 — End: 2023-03-02

## 2023-02-01 NOTE — Telephone Encounter (Signed)
Med refill request:Anne Hayden Last AEX: 11/10/21 Next AEX: 02/14/23 Last MMG (if hormonal med) 02/19/22 Refill authorized: Please Advise, #[phone], 4 RF

## 2023-02-01 NOTE — Telephone Encounter (Signed)
Sent!

## 2023-02-01 NOTE — Telephone Encounter (Signed)
Received call form Dr. Sheela Stack pt requesting a bridge refill of Klonopin 0.5 mg BID w/a third dose every day PRN for severe anxiety. Last script sent on 12/02/22 during last visit for #70 with 1 refill. Pt has an upcoming appointment scheduled on 02/28/21. Please review and advise.

## 2023-02-16 ENCOUNTER — Ambulatory Visit
Admission: RE | Admit: 2023-02-16 | Discharge: 2023-02-16 | Disposition: A | Discharge status: Home / Self Care | Payer: Self-pay | Source: Ambulatory Visit | Attending: Obstetrics & Gynecology | Admitting: Obstetrics & Gynecology

## 2023-02-16 DIAGNOSIS — Z1231 Encounter for screening mammogram for malignant neoplasm of breast: Secondary | ICD-10-CM

## 2023-02-24 ENCOUNTER — Encounter: Payer: Self-pay | Admitting: Radiology

## 2023-02-24 ENCOUNTER — Ambulatory Visit (INDEPENDENT_AMBULATORY_CARE_PROVIDER_SITE_OTHER): Payer: 59 | Admitting: Radiology

## 2023-02-24 VITALS — BP 98/62 | Ht 60.75 in | Wt 101.0 lb

## 2023-02-24 DIAGNOSIS — N762 Acute vulvitis: Secondary | ICD-10-CM | POA: Diagnosis not present

## 2023-02-24 DIAGNOSIS — Z01419 Encounter for gynecological examination (general) (routine) without abnormal findings: Secondary | ICD-10-CM

## 2023-02-24 DIAGNOSIS — Z3041 Encounter for surveillance of contraceptive pills: Secondary | ICD-10-CM | POA: Diagnosis not present

## 2023-02-24 MED ORDER — NYSTATIN-TRIAMCINOLONE 100000-0.1 UNIT/GM-% EX OINT
1.0000 | TOPICAL_OINTMENT | Freq: Two times a day (BID) | CUTANEOUS | 0 refills | Status: AC
Start: 2023-02-24 — End: ?

## 2023-02-24 MED ORDER — BALZIVA 0.4-35 MG-MCG PO TABS
1.0000 | ORAL_TABLET | Freq: Every day | ORAL | 4 refills | Status: DC
Start: 2023-02-24 — End: 2024-02-25

## 2023-02-24 NOTE — Progress Notes (Signed)
   Anne Hayden [facility] [birth year] [id number]   History:  52 y.o. G1P0 presents for annual exam. C/o slight itching of the groin/vulva, vaseline has helped some. No other concerns. Doing well on OCPs. Has a PCP.   Gynecologic History Patient's last menstrual period was 02/03/2023 (approximate). Period Cycle (Days): 28 Period Duration (Days): 3 Period Pattern: Regular Menstrual Flow: Light Menstrual Control: Thin pad Dysmenorrhea: None Contraception/Family planning: OCP (estrogen/progesterone) Sexually active: yes Last Pap: 2023. Results were: normal, 2022 ASCUS HPV+, colpo CIN 1 Last mammogram: 02/16/23. Results were: normal  Obstetric History OB History  Gravida Para Term Preterm AB Living  1 0     1 0  SAB IAB Ectopic Multiple Live Births               # Outcome Date GA Lbr Len/2nd Weight Sex Type Anes PTL Lv  1 AB              The following portions of the patient's history were reviewed and updated as appropriate: allergies, current medications, past family history, past medical history, past social history, past surgical history, and problem list.  Review of Systems Pertinent items noted in HPI and remainder of comprehensive ROS otherwise negative.   Past medical history, past surgical history, family history and social history were all reviewed and documented in the EPIC chart.   Exam:  Vitals:   02/24/23 1324  BP: 98/62  Weight: 101 lb (45.8 kg)  Height: 5' 0.75" (1.543 m)   Body mass index is 19.24 kg/m.  General appearance:  Normal Thyroid:  Symmetrical, normal in size, without palpable masses or nodularity. Respiratory  Auscultation:  Clear without wheezing or rhonchi Cardiovascular  Auscultation:  Regular rate, without rubs, murmurs or gallops  Edema/varicosities:  Not grossly evident Abdominal  Soft,nontender, without masses, guarding or rebound.  Liver/spleen:  No organomegaly noted  Hernia:  None appreciated  Skin  Inspection:  Grossly  normal Breasts: Examined lying and sitting.   Right: Without masses, retractions, nipple discharge or axillary adenopathy.   Left: Without masses, retractions, nipple discharge or axillary adenopathy. Genitourinary   Inguinal/mons:  Normal without inguinal adenopathy  External genitalia:  erythema and slight excoriation of labia majora  BUS/Urethra/Skene's glands:  Normal without masses or exudate  Vagina:  Normal appearing with normal color and discharge, no lesions  Cervix:  Normal appearing without discharge or lesions  Uterus:  Normal in size, shape and contour.  Mobile, nontender  Adnexa/parametria:     Rt: Normal in size, without masses or tenderness.   Lt: Normal in size, without masses or tenderness.  Anus and perineum: Normal   Raynelle Fanning, CMA present for exam  Assessment/Plan:   1. Well woman exam with routine gynecological exam Normal pap 2023, next pap due 2026  2. Acute vulvitis - nystatin-triamcinolone ointment (MYCOLOG); Apply 1 Application topically 2 (two) times daily.  Dispense: 30 g; Refill: 0  3. Encounter for surveillance of contraceptive pills - norethindrone-ethinyl estradiol (BALZIVA) 0.4-35 MG-MCG tablet; Take 1 tablet by mouth daily.  Dispense: 84 tablet; Refill: 4     Discussed SBE, colonoscopy and DEXA screening as directed/appropriate. Recommend of exercise weekly, including weight bearing exercise. Encouraged the use of seatbelts and sunscreen. Return in 1 year for annual or as needed.   Arlie Solomons B WHNP-BC 1:42 PM 02/24/2023

## 2023-03-01 ENCOUNTER — Telehealth (HOSPITAL_COMMUNITY): Payer: 59 | Admitting: Psychiatry

## 2023-03-02 ENCOUNTER — Telehealth (HOSPITAL_COMMUNITY): Payer: Self-pay | Admitting: *Deleted

## 2023-03-02 DIAGNOSIS — F429 Obsessive-compulsive disorder, unspecified: Secondary | ICD-10-CM

## 2023-03-02 MED ORDER — CLONAZEPAM 0.5 MG PO TABS
ORAL_TABLET | ORAL | 0 refills | Status: DC
Start: 2023-03-02 — End: 2023-04-06

## 2023-03-02 NOTE — Telephone Encounter (Signed)
Pt requesting refill of Klonopin 0.5 mg as last appointment was cx and rescheduled by this office. Pt has f/u now scheduled for 04/07/23. Klonopin 0.5 mg BID/TID if needed for severe anxiety/panic attacks called to [facility] in [facility].

## 2023-04-06 ENCOUNTER — Other Ambulatory Visit (HOSPITAL_COMMUNITY): Payer: Self-pay

## 2023-04-06 DIAGNOSIS — F331 Major depressive disorder, recurrent, moderate: Secondary | ICD-10-CM

## 2023-04-06 DIAGNOSIS — F429 Obsessive-compulsive disorder, unspecified: Secondary | ICD-10-CM

## 2023-04-06 MED ORDER — CITALOPRAM HYDROBROMIDE 40 MG PO TABS: 40.0000 mg | ORAL_TABLET | Freq: Every day | ORAL | 0 refills | Status: DC

## 2023-04-06 MED ORDER — CLONAZEPAM 0.5 MG PO TABS: ORAL_TABLET | ORAL | 0 refills | Status: DC

## 2023-04-07 ENCOUNTER — Telehealth (HOSPITAL_COMMUNITY): Payer: 59 | Admitting: Psychiatry

## 2023-05-03 ENCOUNTER — Telehealth (HOSPITAL_COMMUNITY): Payer: 59 | Admitting: Psychiatry

## 2023-05-11 ENCOUNTER — Other Ambulatory Visit (HOSPITAL_COMMUNITY): Payer: Self-pay

## 2023-05-11 DIAGNOSIS — F429 Obsessive-compulsive disorder, unspecified: Secondary | ICD-10-CM

## 2023-05-11 MED ORDER — CLONAZEPAM 0.5 MG PO TABS: ORAL_TABLET | ORAL | 0 refills | Status: DC

## 2023-05-17 ENCOUNTER — Telehealth (HOSPITAL_BASED_OUTPATIENT_CLINIC_OR_DEPARTMENT_OTHER): Payer: BC Managed Care – PPO | Admitting: Psychiatry

## 2023-05-17 ENCOUNTER — Telehealth (HOSPITAL_COMMUNITY): Payer: Self-pay | Admitting: Psychiatry

## 2023-05-17 ENCOUNTER — Encounter (HOSPITAL_COMMUNITY): Payer: Self-pay | Admitting: Psychiatry

## 2023-05-17 VITALS — Wt 101.0 lb

## 2023-05-17 DIAGNOSIS — F429 Obsessive-compulsive disorder, unspecified: Secondary | ICD-10-CM

## 2023-05-17 DIAGNOSIS — F331 Major depressive disorder, recurrent, moderate: Secondary | ICD-10-CM | POA: Diagnosis not present

## 2023-05-17 MED ORDER — CITALOPRAM HYDROBROMIDE 40 MG PO TABS
40.0000 mg | ORAL_TABLET | Freq: Every day | ORAL | 2 refills | Status: DC
Start: 2023-05-17 — End: 2023-08-20

## 2023-05-17 MED ORDER — CLONAZEPAM 0.5 MG PO TABS: 0.5000 mg | ORAL_TABLET | Freq: Three times a day (TID) | ORAL | 2 refills | Status: DC | PRN

## 2023-05-17 NOTE — Progress Notes (Signed)
[facility] MD Virtual Progress Note   Patient Location: Home Provider Location: Office  I connect with patient by telephone and verified that I am speaking with correct person by using two identifiers. I discussed the limitations of evaluation and management by telemedicine and the availability of in person appointments. I also discussed with the patient that there may be a patient responsible charge related to this service. The patient expressed understanding and agreed to proceed.  Anne Hayden [id number] 52 y.o.  05/17/2023 11:29 AM  History of Present Illness:  Patient is evaluated by phone session.  She reported on and off anxiety, obsessive thoughts and depression but mostly symptoms are manageable.  She has taken few treatment of Spravato from Dr. Evelene Croon.  She reported it is expensive and does not like to take it on a regular basis but like to have the option if needed in the future.  She reported some family stress because her mother does not want to go to younger sister who lives in [place].  Patient feels bad because she believed mother not going because of her.  Patient told she has told few times to go visit the sister but her mother refused.  Patient does talk to her older brother who lives in [place] but very limited contact with her younger sister who lives in [place].  Patient reported since husband moved and living in [place] she feels bad that mother should not stay all the time with her and should enjoy seeing her other children and visit grandchildren.  Patient told she has mentioned few times per her mother does not want to talk about it.  Patient reported sometimes feeling guilty but realized that is her mother's decision.  She is taking Celexa and Klonopin.  She takes Klonopin mostly twice a day and very rarely she needed the third pill.  Patient reported last month was very stressful because she had a kidney stone and likely it past but she had  a lot of pain.  She reports some financial concerns because she was in between changing the insurance and she have to pay the bills.  She sleeps okay.  She has occasionally passive and fleeting suicidal thoughts but no plan or any intent.  She admitted they are chronic thoughts and they have been there for a long time.  She excited about upcoming trip to [place] to visit her husband's family.  She is looking forward for that trip.  She denies drinking or using any illegal substances.  Her sleep is okay.  Her appetite is okay.  No significant weight gain or weight loss.   Past Psychiatric History: H/O anxiety, OCD, mania and depression. H/O inpatient at [facility] in 2010. No h/o suicidal attempt.  Seroquel cause weight gain, Risperdal caused nausea, Trintellix cause itching, Geodon, Neurontin, Paxil, Tofranil and Abilify did not help. Luvox Caused side effects.Took Lamictal worked in [place] but did not work in [place]. Wellbutrin caused loss of taste. ECT helped.   We tried Luvox that did not help     Outpatient Encounter Medications as of 05/17/2023  Medication Sig   bimatoprost (LATISSE) 0.03 % ophthalmic solution APPLY TO AFFECTED AREA EVERY DAY AS DIRECTED   citalopram (CELEXA) 40 MG tablet Take 1 tablet (40 mg total) by mouth daily. Bridge to get pt to appt on 05/03/23   clindamycin (CLEOCIN T) 1 % lotion clindamycin 1 % lotion   clonazePAM (KLONOPIN) 0.5 MG tablet Take one tab twice  daily and 3rd as needed for severe anxiety. 7 day bridge to pt appt on 11/01   fluvoxaMINE (LUVOX) 50 MG tablet Take 1 tablet (50 mg total) by mouth daily. (Patient not taking: Reported on 12/02/2022)   ketoconazole (NIZORAL) 2 % shampoo ketoconazole 2 % shampoo   norethindrone-ethinyl estradiol (BALZIVA) 0.4-35 MG-MCG tablet Take 1 tablet by mouth daily.   nystatin-triamcinolone ointment (MYCOLOG) Apply 1 Application topically 2 (two) times daily.   Sod Fluoride-Potassium Nitrate 1.1-5 % PSTE PreviDent 5000  Sensitive 1.1 %-5 % dental paste   SPRAVATO, 84 MG DOSE, 28 MG/DEVICE SOPK Place into both nostrils.   tretinoin (RETIN-A) 0.1 % cream Apply topically at bedtime.   VITAMIN D PO Take by mouth.   No facility-administered encounter medications on file as of 05/17/2023.    No results found for this or any previous visit (from the past 2160 hour(s)).   Psychiatric Specialty Exam: Physical Exam  Review of Systems  Weight 101 lb (45.8 kg).There is no height or weight on file to calculate BMI.  General Appearance: NA  Eye Contact:  NA  Speech:  Slow  Volume:  Decreased  Mood:  Anxious  Affect:  NA  Thought Process:  Descriptions of Associations: Intact  Orientation:  Full (Time, Place, and Person)  Thought Content:  Obsessions and Rumination  Suicidal Thoughts:  No  Homicidal Thoughts:  No  Memory:  Immediate;   Good Recent;   Good Remote;   Fair  Judgement:  Intact  Insight:  Fair  Psychomotor Activity:  NA  Concentration:  Concentration: Fair and Attention Span: Fair  Recall:  Good  Fund of Knowledge:  Good  Language:  Good  Akathisia:  No  Handed:  Right  AIMS (if indicated):     Assets:  Communication Skills Desire for Improvement Housing Social Support Transportation  ADL's:  Intact  Cognition:  WNL  Sleep: Fair     Assessment/Plan: Obsessive-compulsive disorder, unspecified type - Plan: clonazePAM (KLONOPIN) 0.5 MG tablet, citalopram (CELEXA) 40 MG tablet  MDD (major depressive disorder), recurrent episode, moderate (HCC) - Plan: citalopram (CELEXA) 40 MG tablet  Discussed chronic symptoms which are stable and manageable.  Her obsessive thoughts, anxiety and depression get sometimes worse.  She had a good experience with Sparvato and she acknowledged if needed then she can go back to receive treatment from Dr. Evelene Croon office.  But realize it is expensive and does not like to use as a first choice.  She like to keep the Celexa 40 mg daily and Klonopin 0.5 mg twice a  day.  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  Encouraged hydration as patient has recently kidney stone.  Patient is in the process of getting more work up including ultrasound of the kidney.  Recommended to call us back if she has any question or any concern.  Follow-up in 3 months.   Follow Up Instructions:     I discussed the assessment and treatment plan with the patient. The patient was provided an opportunity to ask questions and all were answered. The patient agreed with the plan and demonstrated an understanding of the instructions.   The patient was advised to call back or seek an in-person evaluation if the symptoms worsen or if the condition fails to improve as anticipated.    Collaboration of Care: Other provider involved in patient's care AEB notes are available in epic to review  Patient/Guardian was advised Release of Information must be obtained prior to any record  release in order to collaborate their care with an outside provider. Patient/Guardian was advised if they have not already done so to contact the registration department to sign all necessary forms in order for [place] to release information regarding their care.   Consent: Patient/Guardian gives verbal consent for treatment and assignment of benefits for services provided during this visit. Patient/Guardian expressed understanding and agreed to proceed.     I provided 27 minutes of non face to face time during this encounter.  Note: This document was prepared by [employer] voice dictation technology and any errors that results from this process are unintentional.    Cleotis Nipper, MD 05/17/2023

## 2023-05-19 ENCOUNTER — Other Ambulatory Visit (HOSPITAL_COMMUNITY): Payer: Self-pay | Admitting: Psychiatry

## 2023-05-19 DIAGNOSIS — F429 Obsessive-compulsive disorder, unspecified: Secondary | ICD-10-CM

## 2023-05-19 DIAGNOSIS — F331 Major depressive disorder, recurrent, moderate: Secondary | ICD-10-CM

## 2023-05-24 DIAGNOSIS — N200 Calculus of kidney: Secondary | ICD-10-CM | POA: Diagnosis not present

## 2023-05-24 DIAGNOSIS — N281 Cyst of kidney, acquired: Secondary | ICD-10-CM | POA: Diagnosis not present

## 2023-05-27 DIAGNOSIS — F332 Major depressive disorder, recurrent severe without psychotic features: Secondary | ICD-10-CM | POA: Diagnosis not present

## 2023-06-01 DIAGNOSIS — F332 Major depressive disorder, recurrent severe without psychotic features: Secondary | ICD-10-CM | POA: Diagnosis not present

## 2023-07-26 DIAGNOSIS — K130 Diseases of lips: Secondary | ICD-10-CM | POA: Diagnosis not present

## 2023-08-10 ENCOUNTER — Other Ambulatory Visit (HOSPITAL_COMMUNITY): Payer: Self-pay | Admitting: Psychiatry

## 2023-08-10 DIAGNOSIS — F331 Major depressive disorder, recurrent, moderate: Secondary | ICD-10-CM

## 2023-08-10 DIAGNOSIS — F429 Obsessive-compulsive disorder, unspecified: Secondary | ICD-10-CM

## 2023-08-20 ENCOUNTER — Encounter (HOSPITAL_COMMUNITY): Payer: Self-pay | Admitting: Psychiatry

## 2023-08-20 ENCOUNTER — Telehealth (HOSPITAL_BASED_OUTPATIENT_CLINIC_OR_DEPARTMENT_OTHER): Payer: BC Managed Care – PPO | Admitting: Psychiatry

## 2023-08-20 VITALS — Wt 101.0 lb

## 2023-08-20 DIAGNOSIS — F429 Obsessive-compulsive disorder, unspecified: Secondary | ICD-10-CM | POA: Diagnosis not present

## 2023-08-20 DIAGNOSIS — F331 Major depressive disorder, recurrent, moderate: Secondary | ICD-10-CM

## 2023-08-20 MED ORDER — CITALOPRAM HYDROBROMIDE 40 MG PO TABS: 40.0000 mg | ORAL_TABLET | Freq: Every day | ORAL | 2 refills | Status: DC

## 2023-08-20 MED ORDER — CLONAZEPAM 0.5 MG PO TABS: 0.5000 mg | ORAL_TABLET | Freq: Three times a day (TID) | ORAL | 2 refills | Status: DC | PRN

## 2023-08-20 NOTE — Progress Notes (Signed)
 [facility] MD Virtual Progress Note   Patient Location: Home Provider Location: Home Office  I connect with patient by telephone and verified that I am speaking with correct person by using two identifiers. I discussed the limitations of evaluation and management by telemedicine and the availability of in person appointments. I also discussed with the patient that there may be a patient responsible charge related to this service. The patient expressed understanding and agreed to proceed.  Anne Hayden [id number] 53 y.o.  08/20/2023 11:11 AM  History of Present Illness:  Patient is evaluated by phone session.  She does not like video but promised to have in person visit next time.  She reported chronic anxiety and now overwhelming due to recent political situation.  She worried about the world crisis and situation.  She had a trip to [place] to visit her husband's family but felt very nervous and anxious because she does not know how to speak [place].  Patient told the family were talking to her in English but most of the time they were talking amongst self in German.  Patient told finally her mother visited patient's sister in [place].  She admitted lately for sad, anxious, depressed and sleeping too much.  She like to consider going to Spravato treatment which she believes helped her a lot.  Patient told her insurance does cover and continue.  She admitted sometimes a lot of stomach issues because of anxiety but her sleep is good.  Her weight is stable.  She denies any crying spells or any feeling of hopelessness or worthlessness.  Denies any suicidal thoughts or homicidal thoughts.  She like to continue Klonopin  and Celexa .  She has no major concerns or side effects from the medication.  She has chronic obsessive thoughts but she feels medicine keeping her stable.  She is hoping to get Spravato treatment to get her depression resolved.  Past Psychiatric History: H/O anxiety,  OCD, mania and depression. H/O inpatient at [facility] in 2010. No h/o suicidal attempt.  Seroquel cause weight gain, Risperdal caused nausea, Trintellix cause itching, Geodon, Neurontin, Paxil, Tofranil and Abilify  did not help. Luvox  Caused side effects.Took Lamictal  worked in [place] but did not work in [place] . Wellbutrin  caused loss of taste. ECT helped.   We tried Luvox  that did not help    Outpatient Encounter Medications as of 08/20/2023  Medication Sig   bimatoprost (LATISSE) 0.03 % ophthalmic solution APPLY TO AFFECTED AREA EVERY DAY AS DIRECTED   citalopram  (CELEXA ) 40 MG tablet Take 1 tablet (40 mg total) by mouth daily.   clindamycin (CLEOCIN T) 1 % lotion clindamycin 1 % lotion   clonazePAM  (KLONOPIN ) 0.5 MG tablet Take 1 tablet (0.5 mg total) by mouth 3 (three) times daily as needed for anxiety.   ketoconazole (NIZORAL) 2 % shampoo ketoconazole 2 % shampoo   norethindrone -ethinyl estradiol  (BALZIVA ) 0.4-35 MG-MCG tablet Take 1 tablet by mouth daily.   nystatin -triamcinolone  ointment (MYCOLOG) Apply 1 Application topically 2 (two) times daily.   Sod Fluoride-Potassium Nitrate 1.1-5 % PSTE PreviDent 5000 Sensitive 1.1 %-5 % dental paste   SPRAVATO, 84 MG DOSE, 28 MG/DEVICE SOPK Place into both nostrils.   tretinoin  (RETIN-A ) 0.1 % cream Apply topically at bedtime.   VITAMIN D  PO Take by mouth.   No facility-administered encounter medications on file as of 08/20/2023.    No results found for this or any previous visit (from the past 2160 hours).   Psychiatric Specialty Exam: Physical Exam  Review of Systems  Weight 101 lb (45.8 kg).There is no height or weight on file to calculate BMI.  General Appearance: NA  Eye Contact:  NA  Speech:  Slow  Volume:  Decreased  Mood:  Anxious  Affect:  NA  Thought Process:  Descriptions of Associations: Intact  Orientation:  Full (Time, Place, and Person)  Thought Content:  Rumination  Suicidal Thoughts:  No  Homicidal Thoughts:  No   Memory:  Immediate;   Fair Recent;   Fair Remote;   Fair  Judgement:  Intact  Insight:  Present  Psychomotor Activity:  Decreased  Concentration:  Concentration: Fair and Attention Span: Fair  Recall:  Good  Fund of Knowledge:  Good  Language:  Good  Akathisia:  No  Handed:  Right  AIMS (if indicated):     Assets:  Communication Skills Desire for Improvement Housing Social Support Transportation  ADL's:  Intact  Cognition:  WNL  Sleep:  9-10 hrs     Assessment/Plan: MDD (major depressive disorder), recurrent episode, moderate (HCC) - Plan: citalopram  (CELEXA ) 40 MG tablet  Obsessive-compulsive disorder, unspecified type - Plan: citalopram  (CELEXA ) 40 MG tablet, clonazePAM  (KLONOPIN ) 0.5 MG tablet  Discussed chronic symptoms and recommended to consider starting Spravato treatment since insurance is much better and she can afford the treatments.  Only issue she has only 1 car but hoping once she had transportation she will contact the office of Dr. Vincente.  Emphasized that she need a physical and blood work.  Discussed medication side effects and benefits.  Recommended to call us  back if she has any question or any concern.  Follow-up in 3 months in person as patient cannot do video.   Follow Up Instructions:     I discussed the assessment and treatment plan with the patient. The patient was provided an opportunity to ask questions and all were answered. The patient agreed with the plan and demonstrated an understanding of the instructions.   The patient was advised to call back or seek an in-person evaluation if the symptoms worsen or if the condition fails to improve as anticipated.    Collaboration of Care: Other provider involved in patient's care AEB notes are available in epic to review  Patient/Guardian was advised Release of Information must be obtained prior to any record release in order to collaborate their care with an outside provider. Patient/Guardian was advised  if they have not already done so to contact the registration department to sign all necessary forms in order for us  to release information regarding their care.   Consent: Patient/Guardian gives verbal consent for treatment and assignment of benefits for services provided during this visit. Patient/Guardian expressed understanding and agreed to proceed.     I provided 19 minutes of non face to face time during this encounter.  Note: This document was prepared by [employer] voice dictation technology and any errors that results from this process are unintentional.    Leni ONEIDA Client, MD 08/20/2023

## 2023-08-24 ENCOUNTER — Telehealth (HOSPITAL_COMMUNITY): Payer: Self-pay | Admitting: *Deleted

## 2023-08-24 DIAGNOSIS — F332 Major depressive disorder, recurrent severe without psychotic features: Secondary | ICD-10-CM | POA: Diagnosis not present

## 2023-08-24 NOTE — Telephone Encounter (Signed)
Pt called with c/o n/v after using the Sparavto nasal spray. Pt does not eat prior to treatment as advised. She says she has some Zofran but that doesn't always work and would like your thoughts on this. Please review and advise.

## 2023-08-24 NOTE — Telephone Encounter (Signed)
Need to contact the provider who is prescribing sparvato.

## 2023-08-24 NOTE — Telephone Encounter (Signed)
Will advise

## 2023-09-14 DIAGNOSIS — Z1330 Encounter for screening examination for mental health and behavioral disorders, unspecified: Secondary | ICD-10-CM | POA: Diagnosis not present

## 2023-09-14 DIAGNOSIS — N200 Calculus of kidney: Secondary | ICD-10-CM | POA: Diagnosis not present

## 2023-10-03 DIAGNOSIS — Z03818 Encounter for observation for suspected exposure to other biological agents ruled out: Secondary | ICD-10-CM | POA: Diagnosis not present

## 2023-10-03 DIAGNOSIS — J069 Acute upper respiratory infection, unspecified: Secondary | ICD-10-CM | POA: Diagnosis not present

## 2023-10-05 ENCOUNTER — Ambulatory Visit: Payer: Self-pay | Admitting: Family Medicine

## 2023-11-17 ENCOUNTER — Telehealth (HOSPITAL_COMMUNITY): Payer: BC Managed Care – PPO | Admitting: Psychiatry

## 2023-11-18 ENCOUNTER — Encounter (HOSPITAL_COMMUNITY): Payer: Self-pay | Admitting: Psychiatry

## 2023-11-18 ENCOUNTER — Other Ambulatory Visit: Payer: Self-pay

## 2023-11-18 ENCOUNTER — Ambulatory Visit (HOSPITAL_BASED_OUTPATIENT_CLINIC_OR_DEPARTMENT_OTHER): Payer: BC Managed Care – PPO | Admitting: Psychiatry

## 2023-11-18 VITALS — BP 105/69 | HR 85 | Ht 61.0 in | Wt 102.0 lb

## 2023-11-18 DIAGNOSIS — F331 Major depressive disorder, recurrent, moderate: Secondary | ICD-10-CM

## 2023-11-18 DIAGNOSIS — F429 Obsessive-compulsive disorder, unspecified: Secondary | ICD-10-CM | POA: Diagnosis not present

## 2023-11-18 MED ORDER — CLONAZEPAM 0.5 MG PO TABS: 0.5000 mg | ORAL_TABLET | Freq: Three times a day (TID) | ORAL | 2 refills | Status: DC | PRN

## 2023-11-18 MED ORDER — REXULTI 0.5 MG PO TABS: 0.5000 mg | ORAL_TABLET | Freq: Every day | ORAL | Status: DC

## 2023-11-18 MED ORDER — CITALOPRAM HYDROBROMIDE 40 MG PO TABS: 40.0000 mg | ORAL_TABLET | Freq: Every day | ORAL | 2 refills | Status: DC

## 2023-11-18 NOTE — Progress Notes (Signed)
 BH MD/PA/NP OP Progress Note  Patient location; office Provider location; office  11/18/2023 10:02 AM Anne Hayden  MRN:  [id number]  Chief Complaint:  Chief Complaint  Patient presents with   Anxiety   Depression   Follow-up   HPI: Patient came today for her follow-up appointment in the office.  She stopped Spravato treatment because of persistent nausea and having issues with the taste of the food.  Even though she had stopped treatment still she feels very nauseated when she thinks about mint flavor.  She also endorsed after the Spravato treatment she had suicidal thoughts and she locked herself in a closet and told her husband.  Patient decided not to go to hospital because she realized it is the afterward treatment side effects and usually it go away.  Patient told after the overnight she was feeling better but her husband told not to consider treatment in the future.  She still have obsessive thoughts and lately she is thinking about her neck.  She reported her obsessive thoughts usually around her body but they are not as bad.  She is still able to function but requires a lot of motivation to do things.  Patient or husband is working long hours and when she tried to talk to him during the day he get upset.  Patient told he understand he is busy but also like to have some time with him during the day.  Patient is 52 year old mother living with her.  Patient denies any crying spells or any feeling of hopelessness or worthlessness.  She has chronic residual depressive thoughts and anxiety.  She denies any agitation, anger, mania, psychosis.  She denies any active suicidal thoughts.  Her appetite is okay.  Her weight is unchanged from the past.  She has appointment coming up to see her primary care in August.  She is on Klonopin  and Celexa .  She takes Klonopin  twice a day and very rarely she needs 3 pill.  She sleeps okay but in the beginning of the sleep she thinks a lot but able to get at least  7 8 hours.  Patient not interested in therapy.  Visit Diagnosis:    ICD-10-CM   1. Obsessive-compulsive disorder, unspecified type  F42.9 citalopram  (CELEXA ) 40 MG tablet    clonazePAM  (KLONOPIN ) 0.5 MG tablet    Brexpiprazole (REXULTI) 0.5 MG TABS    2. MDD (major depressive disorder), recurrent episode, moderate (HCC)  F33.1 citalopram  (CELEXA ) 40 MG tablet    Brexpiprazole (REXULTI) 0.5 MG TABS      Past Psychiatric History: Reviewed H/O anxiety, OCD, mania and depression. H/O inpatient at [facility] in 2010. No h/o suicidal attempt.  Seroquel cause weight gain, Risperdal caused nausea, Trintellix cause itching, Geodon, Neurontin, Paxil, Tofranil and Abilify  did not help. Luvox  Caused side effects.Took Lamictal  worked in [place] but did not work in [employer] . Wellbutrin  caused loss of taste. ECT helped.   We tried Luvox  that did not help   Past Medical History:  Past Medical History:  Diagnosis Date   Allergy    Depression with anxiety    est. with psychiatry, Dr. Alaze Garverick. In the past she reports she has been told she was bipolar and OCD   OCD (obsessive compulsive disorder)    Osteoarthritis    Vitamin D  deficiency     Past Surgical History:  Procedure Laterality Date   CERVICAL BIOPSY  10/08/2016   CIN I-low grade   COLPOSCOPY  2004,2005,2012,2018   TOE SURGERY  Right     Family Psychiatric History: Reviewed  Family History:  Family History  Problem Relation Age of Onset   Arthritis Mother    Hypertension Mother    Heart disease Father    Stroke Father    Polycythemia Father    Breast cancer Sister 47   Breast cancer Maternal Grandmother 52   Melanoma Maternal Grandfather    COPD Maternal Grandfather    Cancer Maternal Grandfather        melanoma   Diabetes Paternal Grandmother    Heart disease Paternal Grandfather    Bipolar disorder Paternal Uncle     Social History:  Social History   Socioeconomic History   Marital status: Married    Spouse name:  markus   Number of children: 0   Years of education: Not on file   Highest education level: Master's degree (e.g., MA, MS, MEng, MEd, MSW, MBA)  Occupational History   Not on file  Tobacco Use   Smoking status: Never    Passive exposure: Never   Smokeless tobacco: Never  Vaping Use   Vaping status: Never Used  Substance and Sexual Activity   Alcohol use: Yes    Comment: rare   Drug use: Never   Sexual activity: Yes    Partners: Male    Birth control/protection: Pill    Comment: 1st intercourse- 16, partners- less than 20, married- 6yrs   Other Topics Concern   Not on file  Social History Narrative   Married. Husband lives in [place] through the [place]. She recently moved to [place] to be closer to her family.    Masters of Arts degree. Unemployed.    Social Drivers of Corporate investment banker Strain: Low Risk  (08/01/2022)   Received from [facility]   Overall Financial Resource Strain (CARDIA)    Difficulty of Paying Living Expenses: Not hard at all  Food Insecurity: No Food Insecurity (08/01/2022)   Received from [facility]   Hunger Vital Sign    Worried About Running Out of Food in the Last Year: Never true    Ran Out of Food in the Last Year: Never true  Transportation Needs: No Transportation Needs (08/01/2022)   Received from [facility]   PRAPARE - Transportation    Lack of Transportation (Medical): No    Lack of Transportation (Non-Medical): No  Physical Activity: Insufficiently Active (08/01/2022)   Received from [facility]   Exercise Vital Sign    Days of Exercise per [place]: 2 days    Minutes of Exercise per Session: 50 min  Stress: Stress Concern Present (08/01/2022)   Received from [facility] - Occupational Stress Questionnaire    Feeling of Stress : Rather much  Social Connections: Somewhat Isolated (08/01/2022)   Received from [facility]   Social Network    How would you rate your social network (family, work, friends)?: Restricted participation with some degree of social isolation    Allergies: No Known Allergies  Metabolic Disorder Labs: Lab Results  Component Value Date   HGBA1C 5.0 02/02/2022   MPG 97 02/02/2022   No results found for: "PROLACTIN" Lab Results  Component Value Date   CHOL 160 02/02/2022   TRIG 81 02/02/2022   HDL 50 02/02/2022   CHOLHDL 3.2 02/02/2022   VLDL 29.5 01/25/2020   LDLCALC 93 02/02/2022   LDLCALC 100 (H)  01/25/2020   Lab Results  Component Value Date   TSH 2.23 02/02/2022    Therapeutic Level Labs: No results found for: "LITHIUM" No results found for: "VALPROATE" No results found for: "CBMZ"  Current Medications: Current Outpatient Medications  Medication Sig Dispense Refill   bimatoprost (LATISSE) 0.03 % ophthalmic solution APPLY TO AFFECTED AREA EVERY DAY AS DIRECTED     citalopram  (CELEXA ) 40 MG tablet Take 1 tablet (40 mg total) by mouth daily. 30 tablet 2   clindamycin (CLEOCIN T) 1 % lotion clindamycin 1 % lotion     clonazePAM  (KLONOPIN ) 0.5 MG tablet Take 1 tablet (0.5 mg total) by mouth 3 (three) times daily as needed for anxiety. 70 tablet 2   ketoconazole (NIZORAL) 2 % shampoo ketoconazole 2 % shampoo     norethindrone -ethinyl estradiol  (BALZIVA ) 0.4-35 MG-MCG tablet Take 1 tablet by mouth daily. 84 tablet 4   nystatin -triamcinolone  ointment (MYCOLOG) Apply 1 Application topically 2 (two) times daily. 30 g 0   Sod Fluoride-Potassium Nitrate 1.1-5 % PSTE PreviDent 5000 Sensitive 1.1 %-5 % dental paste     SPRAVATO, 84 MG DOSE, 28 MG/DEVICE SOPK Place into both nostrils.     tretinoin  (RETIN-A ) 0.1 % cream Apply topically at bedtime.     VITAMIN D  PO Take by mouth.     No current facility-administered medications for this visit.     Musculoskeletal: Strength & Muscle Tone: within normal limits Gait & Station: normal Patient leans:  N/A  Psychiatric Specialty Exam: Review of Systems  Blood pressure 105/69, pulse 85, height 5\' 1"  (1.549 m), weight 102 lb (46.3 kg).Body mass index is 19.27 kg/m.  General Appearance: Casual  Eye Contact:  Fair  Speech:  Slow  Volume:  Decreased  Mood:  Anxious  Affect:  Restricted  Thought Process:  Descriptions of Associations: Intact  Orientation:  Full (Time, Place, and Person)  Thought Content: Obsessions and Rumination   Suicidal Thoughts:  No  Homicidal Thoughts:  No  Memory:  Immediate;   Good Recent;   Good Remote;   Good  Judgement:  Fair  Insight:  Present  Psychomotor Activity:  Decreased  Concentration:  Concentration: Fair and Attention Span: Good  Recall:  Good  Fund of Knowledge: Good  Language: Good  Akathisia:  No  Handed:  Right  AIMS (if indicated): not done  Assets:  Communication Skills Desire for Improvement Housing Transportation  ADL's:  Intact  Cognition: WNL  Sleep:  Good   Screenings: ECT-MADRS    Flowsheet Row ECT Treatment [facility]16/2018 in [facility] ECT Treatment from 05/09/2018 in [facility]  MADRS Total Score 22 35      GAD-7    Flowsheet Row Office Visit from 01/25/2020 in [facility]  Total GAD-7 Score 5      Mini-Mental    Flowsheet Row ECT Treatment from 05/16/2018 in [facility] ECT Treatment from 05/09/2018 in [facility]  Total Score (max 30 points ) 30 30      PHQ2-9    Flowsheet Row Office Visit from 01/25/2020 in [facility] [place] Office Visit from 01/26/2018 in [facility] [place]  PHQ-2 Total Score 1 2  PHQ-9 Total Score 4 7      Flowsheet Row Video [facility]20[facility] Video Visit from 10[facility]16/2020 [facility] Treatment from 05/18/2018 in [facility]  C-SSRS RISK CATEGORY No Risk No Risk Low Risk        Assessment and Plan: Patient is 53 year old female with history of obsessive thoughts, anxiety, major depressive disorder.  Discussed residual symptoms and side effects of Spravato treatment.  In the past she had tried multiple medication but either did not work or had poor response.  Patient acknowledged and had decided not to consider Spravato in the future because it made her more depressed and had suicidal thoughts for few hours.  She also had a lot of nausea and vomiting after the treatment.  We discussed to try Rexulti to help her residual symptoms of obsessive thoughts.  Patient not sure if she can afford but willing to try.  We will provide samples Rexulti 0.5 mg.  Continue Klonopin  0.5 mg to take up to 3 times a day as needed and Celexa  40 mg daily.  Patient not interested in therapy as she had tried multiple therapists but not able to connect any of them.  I recommend to call us  back if she is any question.  Discussed medication side effects and benefits.  Follow-up in 3 months.  If patient like REXULTI she will call us  and we will call the prescription.  Collaboration of Care: Collaboration of Care: Other provider involved in patient's care AEB notes are available in epic to review  Patient/Guardian was advised Release of Information must be obtained prior to any record release in order to collaborate their care with an outside provider. Patient/Guardian was advised if they have not already done so to contact the registration department to sign all necessary forms in order for us  to release information regarding their care.   Consent: Patient/Guardian gives verbal consent for treatment and assignment of benefits for services provided during this visit. Patient/Guardian expressed understanding and agreed to proceed.    Arturo Late, MD 11/18/2023,  10:02 AM

## 2023-11-26 ENCOUNTER — Encounter: Payer: Self-pay | Admitting: Family Medicine

## 2023-11-26 ENCOUNTER — Ambulatory Visit (INDEPENDENT_AMBULATORY_CARE_PROVIDER_SITE_OTHER): Payer: Self-pay | Admitting: Family Medicine

## 2023-11-26 VITALS — BP 118/80 | HR 82 | Temp 98.0°F | Wt 99.2 lb

## 2023-11-26 DIAGNOSIS — R350 Frequency of micturition: Secondary | ICD-10-CM | POA: Diagnosis not present

## 2023-11-26 LAB — POC URINALSYSI DIPSTICK (AUTOMATED)
Bilirubin, UA: NEGATIVE
Glucose, UA: NEGATIVE
Ketones, UA: NEGATIVE
Leukocytes, UA: NEGATIVE
Nitrite, UA: NEGATIVE
Protein, UA: POSITIVE — AB
Spec Grav, UA: 1.02 (ref 1.010–1.025)
Urobilinogen, UA: NEGATIVE U/dL — AB
pH, UA: 6 (ref 5.0–8.0)

## 2023-11-26 NOTE — Progress Notes (Signed)
 Anne Hayden , 06-30-1971, 53 y.o., female MRN: [id number] Patient Care Team    Relationship Specialty Notifications Start End  Mariel Shope, DO PCP - General Family Medicine  01/26/18   Arturo Late, MD Consulting Physician Psychiatry  01/27/18   Baldo Bonds, MD Consulting Physician Gastroenterology  01/25/20   Eduardo Grade, MD Referring Physician Dermatology  01/25/20   Percy Bracken, MD Consulting Physician Obstetrics and Gynecology  01/25/20     Chief Complaint  Patient presents with   Urinary Frequency    1 week; urinary frequency. Denies any other symptoms. Pt has not taken anything.      Subjective: Anne Hayden is a 53 y.o. Pt presents for an OV with complaints of urinary frequency of 1 week duration.  Associated symptoms include she denies dysuria, fevers, chills or low back pain.  Tolerating p.o.  Recently started new medication for her mental health, and feels the side effect may be the increased urinary frequency.     01/25/2020    9:55 AM 01/26/2018   11:07 AM  Depression screen PHQ 2/9  Decreased Interest 0 0  Down, Depressed, Hopeless 1 2  PHQ - 2 Score 1 2  Altered sleeping 0 1  Tired, decreased energy 0 1  Change in appetite 0 0  Feeling bad or failure about yourself  0 2  Trouble concentrating 3 1  Moving slowly or fidgety/restless 0 0  Suicidal thoughts 0 0  PHQ-9 Score 4 7  Difficult doing work/chores Not difficult at all     No Known Allergies Social History   Social History Narrative   Married. Husband lives in [place] through the week. She recently moved to [place] to be closer to her family.    Masters of Arts degree. Unemployed.    Past Medical History:  Diagnosis Date   Allergy    Depression with anxiety    est. with psychiatry, Dr. Arfeen. In the past she reports she has been told she was bipolar and OCD   OCD (obsessive compulsive disorder)    Osteoarthritis    Vitamin D  deficiency    Past Surgical History:   Procedure Laterality Date   CERVICAL BIOPSY  10/08/2016   CIN I-low grade   COLPOSCOPY  2004,2005,2012,2018   TOE SURGERY Right    Family History  Problem Relation Age of Onset   Arthritis Mother    Hypertension Mother    Heart disease Father    Stroke Father    Polycythemia Father    Breast cancer Sister 46   Breast cancer Maternal Grandmother 6   Melanoma Maternal Grandfather    COPD Maternal Grandfather    Cancer Maternal Grandfather        melanoma   Diabetes Paternal Grandmother    Heart disease Paternal Grandfather    Bipolar disorder Paternal Uncle    Allergies as of 11/26/2023   No Known Allergies      Medication List        Accurate as of Nov 26, 2023  2:54 PM. If you have any questions, ask your nurse or doctor.          Balziva  0.4-35 MG-MCG tablet Generic drug: norethindrone -ethinyl estradiol  Take 1 tablet by mouth daily.   bimatoprost 0.03 % ophthalmic solution Commonly known as: LATISSE APPLY TO AFFECTED AREA EVERY DAY AS DIRECTED   citalopram  40 MG tablet Commonly known as: CELEXA  Take 1 tablet (40 mg total) by mouth daily.  clindamycin 1 % lotion Commonly known as: CLEOCIN T clindamycin 1 % lotion   clonazePAM  0.5 MG tablet Commonly known as: KLONOPIN  Take 1 tablet (0.5 mg total) by mouth 3 (three) times daily as needed for anxiety.   ketoconazole 2 % shampoo Commonly known as: NIZORAL ketoconazole 2 % shampoo   nystatin -triamcinolone  ointment Commonly known as: MYCOLOG Apply 1 Application topically 2 (two) times daily.   Rexulti  0.5 MG Tabs Generic drug: Brexpiprazole  Take 1 tablet (0.5 mg total) by mouth daily.   Sod Fluoride-Potassium Nitrate 1.1-5 % Pste PreviDent 5000 Sensitive 1.1 %-5 % dental paste   Spravato (84 MG Dose) 28 MG/DEVICE Sopk Generic drug: Esketamine HCl (84 MG Dose) Place into both nostrils.   tretinoin  0.1 % cream Commonly known as: RETIN-A  Apply topically at bedtime.   VITAMIN D  PO Take by  mouth.        All past medical history, surgical history, allergies, family history, immunizations andmedications were updated in the EMR today and reviewed under the history and medication portions of their EMR.     ROS Negative, with the exception of above mentioned in HPI   Objective:  BP 118/80   Pulse 82   Temp 98 F (36.7 C)   Wt 99 lb 3.2 oz (45 kg)   SpO2 98%   BMI 18.74 kg/m  Body mass index is 18.74 kg/m. Physical Exam Vitals and nursing note reviewed.  Constitutional:      General: She is not in acute distress.    Appearance: Normal appearance. She is normal weight. She is not ill-appearing or toxic-appearing.  HENT:     Head: Normocephalic and atraumatic.  Eyes:     General: No scleral icterus.       Right eye: No discharge.        Left eye: No discharge.     Extraocular Movements: Extraocular movements intact.     Conjunctiva/sclera: Conjunctivae normal.     Pupils: Pupils are equal, round, and reactive to light.  Abdominal:     General: Abdomen is flat. There is no distension.     Palpations: Abdomen is soft.     Tenderness: There is no abdominal tenderness. There is no right CVA tenderness, left CVA tenderness, guarding or rebound.  Skin:    Findings: No rash.  Neurological:     Mental Status: She is alert and oriented to person, place, and time. Mental status is at baseline.     Motor: No weakness.     Coordination: Coordination normal.     Gait: Gait normal.  Psychiatric:        Mood and Affect: Mood normal.        Behavior: Behavior normal.        Thought Content: Thought content normal.        Judgment: Judgment normal.      No results found. No results found. Results for orders placed or performed in visit on 11/26/23 (from the past 24 hours)  POCT Urinalysis Dipstick (Automated)     Status: Abnormal   Collection Time: 11/26/23  2:49 PM  Result Value Ref Range   Color, UA yellow    Clarity, UA clear    Glucose, UA Negative Negative    Bilirubin, UA negative    Ketones, UA negative    Spec Grav, UA 1.020 1.010 - 1.025   Blood, UA trace    pH, UA 6.0 5.0 - 8.0   Protein, UA Positive (A) Negative   Urobilinogen,  UA negative (A) 0.2 or 1.0 E.U./dL   Nitrite, UA negative    Leukocytes, UA Negative Negative    Assessment/Plan: Anne Hayden is a 53 y.o. female present for OV for  Urinary frequency (Primary) Possibly related to new medication start or bladder irritation. - POCT Urinalysis Dipstick (Automated)> does not appear infectious today> will send for urine cx and if appropriate call in abx after result.  - Urinalysis, Routine w reflex microscopic  Reviewed expectations re: course of current medical issues. Discussed self-management of symptoms. Outlined signs and symptoms indicating need for more acute intervention. Patient verbalized understanding and all questions were answered. Patient received an After-Visit Summary.    Orders Placed This Encounter  Procedures   Urinalysis, Routine w reflex microscopic   POCT Urinalysis Dipstick (Automated)   No orders of the defined types were placed in this encounter.  Referral Orders  No referral(s) requested today     Note is dictated utilizing voice recognition software. Although note has been proof read prior to signing, occasional typographical errors still can be missed. If any questions arise, please do not hesitate to call for verification.   electronically signed by:  Napolean Backbone, DO  [facility] [place]

## 2023-11-26 NOTE — Patient Instructions (Addendum)

## 2023-11-27 LAB — URINALYSIS, ROUTINE W REFLEX MICROSCOPIC
Bilirubin Urine: NEGATIVE
Glucose, UA: NEGATIVE
Hgb urine dipstick: NEGATIVE
Ketones, ur: NEGATIVE
Nitrite: NEGATIVE
Specific Gravity, Urine: 1.018 (ref 1.001–1.035)
pH: 5.5 (ref 5.0–8.0)

## 2023-11-27 LAB — MICROSCOPIC MESSAGE

## 2023-11-29 ENCOUNTER — Ambulatory Visit: Payer: Self-pay | Admitting: Family Medicine

## 2023-11-29 DIAGNOSIS — R8290 Unspecified abnormal findings in urine: Secondary | ICD-10-CM

## 2023-11-29 NOTE — Telephone Encounter (Signed)
 Please call pt: Her urine specimen was not a clean specimen and had too many skin cells in it to get an accurate culture. Please have her come to the office to provide a clean catch specimen to resend. Order placed

## 2023-11-29 NOTE — Telephone Encounter (Signed)
 Communication  Reason for CRM: Patient would like to discuss her urinalysis that was done on 5/16. Callback number for patient is [phone].    See result note. Spoke with pt.

## 2023-11-30 ENCOUNTER — Other Ambulatory Visit

## 2023-11-30 DIAGNOSIS — R8290 Unspecified abnormal findings in urine: Secondary | ICD-10-CM | POA: Diagnosis not present

## 2023-12-01 ENCOUNTER — Ambulatory Visit: Payer: Self-pay | Admitting: Family Medicine

## 2023-12-01 LAB — URINALYSIS W MICROSCOPIC + REFLEX CULTURE
Bacteria, UA: NONE SEEN /HPF
Bilirubin Urine: NEGATIVE
Glucose, UA: NEGATIVE
Hgb urine dipstick: NEGATIVE
Hyaline Cast: NONE SEEN /LPF
Ketones, ur: NEGATIVE
Leukocyte Esterase: NEGATIVE
Nitrites, Initial: NEGATIVE
Protein, ur: NEGATIVE
RBC / HPF: NONE SEEN /HPF (ref 0–2)
Specific Gravity, Urine: 1.004 (ref 1.001–1.035)
WBC, UA: NONE SEEN /HPF (ref 0–5)
pH: 7 (ref 5.0–8.0)

## 2023-12-01 LAB — NO CULTURE INDICATED

## 2023-12-02 ENCOUNTER — Other Ambulatory Visit (HOSPITAL_COMMUNITY): Payer: Self-pay | Admitting: *Deleted

## 2023-12-02 DIAGNOSIS — F429 Obsessive-compulsive disorder, unspecified: Secondary | ICD-10-CM

## 2023-12-02 DIAGNOSIS — F331 Major depressive disorder, recurrent, moderate: Secondary | ICD-10-CM

## 2023-12-02 MED ORDER — REXULTI 0.5 MG PO TABS: 0.5000 mg | ORAL_TABLET | Freq: Every day | ORAL | 2 refills | Status: DC

## 2023-12-03 ENCOUNTER — Encounter (HOSPITAL_COMMUNITY): Payer: Self-pay | Admitting: Psychiatry

## 2023-12-03 ENCOUNTER — Telehealth (HOSPITAL_BASED_OUTPATIENT_CLINIC_OR_DEPARTMENT_OTHER): Admitting: Psychiatry

## 2023-12-03 VITALS — Wt 98.0 lb

## 2023-12-03 DIAGNOSIS — F331 Major depressive disorder, recurrent, moderate: Secondary | ICD-10-CM | POA: Diagnosis not present

## 2023-12-03 DIAGNOSIS — F429 Obsessive-compulsive disorder, unspecified: Secondary | ICD-10-CM | POA: Diagnosis not present

## 2023-12-03 MED ORDER — BREXPIPRAZOLE 0.25 MG PO TABS: 0.2500 mg | ORAL_TABLET | Freq: Every day | ORAL | 0 refills | Status: DC

## 2023-12-03 NOTE — Progress Notes (Signed)
 [facility] MD Virtual Progress Note   Patient Location: Home Provider Location: Office  I connect with patient by video and verified that I am speaking with correct person by using two identifiers. I discussed the limitations of evaluation and management by telemedicine and the availability of in person appointments. I also discussed with the patient that there may be a patient responsible charge related to this service. The patient expressed understanding and agreed to proceed.  Anne Hayden [id number] 53 y.o.  12/03/2023 10:28 AM  History of Present Illness:  Patient called to get an earlier appointment.  She reported taking Rexulti  0.5 mg at bedtime and she noticed improvement in her mood, she is more happier and no irritability but her husband and mother noticed that she is more talkative but does not feel that she is more impulsive.  She want to know about the possible side effects of Rexulti  as she experiencing interrupted sleep, increased urination but no tremors, restlessness.  She is very pleased because no longer having chronic and passive suicidal thoughts and her obsessive thoughts are less intense and less frequent.  She is able to go outside and walk in the backyard.  She is not getting irritable angry with her husband or with her 16 year old mother.  Her appetite is okay.  Her weight is stable.  She denies any highs and lows.  She is on Klonopin  which she takes up to 3 a day.  She is on Celexa  40 mg.  Past Psychiatric History: H/O anxiety, OCD, mania and depression. H/O inpatient at [facility] in 2010. No h/o suicidal attempt.  Seroquel cause weight gain, Risperdal caused nausea, Trintellix cause itching, Geodon, Neurontin, Paxil, Tofranil and Abilify  did not help. Luvox  Caused side effects.Took Lamictal  worked in [place] but [place] not work in [place] . Wellbutrin  caused loss of taste. ECT helped.   We tried Luvox  that did not help    Outpatient Encounter  Medications as of 12/03/2023  Medication Sig   bimatoprost (LATISSE) 0.03 % ophthalmic solution APPLY TO AFFECTED AREA EVERY DAY AS DIRECTED   Brexpiprazole  (REXULTI ) 0.5 MG TABS Take 1 tablet (0.5 mg total) by mouth daily.   citalopram  (CELEXA ) 40 MG tablet Take 1 tablet (40 mg total) by mouth daily.   clindamycin (CLEOCIN T) 1 % lotion clindamycin 1 % lotion   clonazePAM  (KLONOPIN ) 0.5 MG tablet Take 1 tablet (0.5 mg total) by mouth 3 (three) times daily as needed for anxiety.   ketoconazole (NIZORAL) 2 % shampoo ketoconazole 2 % shampoo   norethindrone -ethinyl estradiol  (BALZIVA ) 0.4-35 MG-MCG tablet Take 1 tablet by mouth daily.   nystatin -triamcinolone  ointment (MYCOLOG) Apply 1 Application topically 2 (two) times daily.   Sod Fluoride-Potassium Nitrate 1.1-5 % PSTE PreviDent 5000 Sensitive 1.1 %-5 % dental paste   SPRAVATO, 84 MG DOSE, 28 MG/DEVICE SOPK Place into both nostrils.   tretinoin  (RETIN-A ) 0.1 % cream Apply topically at bedtime.   VITAMIN D  PO Take by mouth.   No facility-administered encounter medications on file as of 12/03/2023.    Recent Results (from the past 2160 hours)  POCT Urinalysis Dipstick (Automated)     Status: Abnormal   Collection Time: 11/26/23  2:49 PM  Result Value Ref Range   Color, UA yellow    Clarity, UA clear    Glucose, UA Negative Negative   Bilirubin, UA negative    Ketones, UA negative    Spec Grav, UA 1.020 1.010 - 1.025   Blood, UA trace  pH, UA 6.0 5.0 - 8.0   Protein, UA Positive (A) Negative   Urobilinogen, UA negative (A) 0.2 or 1.0 E.U./dL   Nitrite, UA negative    Leukocytes, UA Negative Negative  Urinalysis, Routine w reflex microscopic     Status: Abnormal   Collection Time: 11/26/23  2:53 PM  Result Value Ref Range   Color, Urine YELLOW YELLOW   APPearance CLOUDY (A) CLEAR   Specific Gravity, Urine 1.018 1.001 - 1.035   pH 5.5 5.0 - 8.0   Glucose, UA NEGATIVE NEGATIVE   Bilirubin Urine NEGATIVE NEGATIVE   Ketones, ur  NEGATIVE NEGATIVE   Hgb urine dipstick NEGATIVE NEGATIVE   Protein, ur TRACE (A) NEGATIVE   Nitrite NEGATIVE NEGATIVE   Leukocytes,Ua TRACE (A) NEGATIVE   WBC, UA 10-20 (A) 0 - 5 /HPF   RBC / HPF 0-2 0 - 2 /HPF   Squamous Epithelial / HPF 10-20 (A) < OR = 5 /HPF   Bacteria, UA FEW (A) NONE SEEN /HPF   Hyaline Cast 0-5 (A) NONE SEEN /LPF  MICROSCOPIC MESSAGE     Status: None   Collection Time: 11/26/23  2:53 PM  Result Value Ref Range   Note      Comment: This urine was analyzed for the presence of WBC,  RBC, bacteria, casts, and other formed elements.  Only those elements seen were reported. . .   Urinalysis w microscopic + reflex cultur     Status: None   Collection Time: 11/30/23 10:59 AM   Specimen: Urine  Result Value Ref Range   Color, Urine YELLOW YELLOW   APPearance CLEAR CLEAR   Specific Gravity, Urine 1.004 1.001 - 1.035   pH 7.0 5.0 - 8.0   Glucose, UA NEGATIVE NEGATIVE   Bilirubin Urine NEGATIVE NEGATIVE   Ketones, ur NEGATIVE NEGATIVE   Hgb urine dipstick NEGATIVE NEGATIVE   Protein, ur NEGATIVE NEGATIVE   Nitrites, Initial NEGATIVE NEGATIVE   Leukocyte Esterase NEGATIVE NEGATIVE   WBC, UA NONE SEEN 0 - 5 /HPF   RBC / HPF NONE SEEN 0 - 2 /HPF   Squamous Epithelial / HPF 0-5 < OR = 5 /HPF   Bacteria, UA NONE SEEN NONE SEEN /HPF   Hyaline Cast NONE SEEN NONE SEEN /LPF   Note      Comment: This urine was analyzed for the presence of WBC,  RBC, bacteria, casts, and other formed elements.  Only those elements seen were reported. . .   REFLEXIVE URINE CULTURE     Status: None   Collection Time: 11/30/23 10:59 AM  Result Value Ref Range   Reflexve Urine Culture      Comment: NO CULTURE INDICATED     Psychiatric Specialty Exam: Physical Exam  Review of Systems  Weight 98 lb (44.5 kg).There is no height or weight on file to calculate BMI.  General Appearance: Casual  Eye Contact:  Good  Speech:  Normal Rate  Volume:  Normal  Mood:  happy and pleasant    Affect:  better  Thought Process:  Goal Directed  Orientation:  Full (Time, Place, and Person)  Thought Content:  Rumination  Suicidal Thoughts:  No  Homicidal Thoughts:  No  Memory:  Immediate;   Good Recent;   Good Remote;   Good  Judgement:  Intact  Insight:  Present  Psychomotor Activity:  Normal  Concentration:  Concentration: Good and Attention Span: Good  Recall:  Good  Fund of Knowledge:  Good  Language:  Good  Akathisia:  No  Handed:  Right  AIMS (if indicated):     Assets:  Communication Skills Desire for Improvement Housing Social Support Transportation  ADL's:  Intact  Cognition:  WNL  Sleep:  sometimes interrupted        01/25/2020    9:55 AM 01/26/2018   11:07 AM  Depression screen PHQ 2/9  Decreased Interest 0 0  Down, Depressed, Hopeless 1 2  PHQ - 2 Score 1 2  Altered sleeping 0 1  Tired, decreased energy 0 1  Change in appetite 0 0  Feeling bad or failure about yourself  0 2  Trouble concentrating 3 1  Moving slowly or fidgety/restless 0 0  Suicidal thoughts 0 0  PHQ-9 Score 4 7  Difficult doing work/chores Not difficult at all     Assessment/Plan: MDD (major depressive disorder), recurrent episode, moderate (HCC) - Plan: brexpiprazole  (REXULTI ) 0.25 MG TABS tablet  Obsessive-compulsive disorder, unspecified type - Plan: brexpiprazole  (REXULTI ) 0.25 MG TABS tablet  Discussed possible medication side effects.  Recommend to take Rexulti  in the morning and we will cut down the dose from 0.5 mg to 0.25 mg.  However discussed therapeutic dose and may need a higher dose eventually when she is more able to tolerate the dose.  Patient also understand that she is taking a very low-dose and agreed to give a higher dose in the future if needed.  She had a visit with the PCP and had a urinalysis but she has no UTI.  She is not sure why she is having increased urination.  I recommend moving the medication in the morning may help her sleep.  She noticed  increased energy.  Will call the 0.25 mg Rexulti  to take in the morning to her pharmacy.  She has samples 0.5 mg for now and she can take half tablet.  She has appointment coming up in August but I encouraged to call us  back sooner if needed earlier appointment.  Continue all other medication as prescribed.  I review urinalysis which is normal.  Recommend any noticing change in the behavior then please call us  back or go to local emergency room.   Follow Up Instructions:     I discussed the assessment and treatment plan with the patient. The patient was provided an opportunity to ask questions and all were answered. The patient agreed with the plan and demonstrated an understanding of the instructions.   The patient was advised to call back or seek an in-person evaluation if the symptoms worsen or if the condition fails to improve as anticipated.    Collaboration of Care: Other provider involved in patient's care AEB notes are available in epic to review  Patient/Guardian was advised Release of Information must be obtained prior to any record release in order to collaborate their care with an outside provider. Patient/Guardian was advised if they have not already done so to contact the registration department to sign all necessary forms in order for us  to release information regarding their care.   Consent: Patient/Guardian gives verbal consent for treatment and assignment of benefits for services provided during this visit. Patient/Guardian expressed understanding and agreed to proceed.     Total encounter time 22 minutes which includes face-to-face time, chart reviewed, care coordination, order entry and documentation during this encounter.   Note: This document was prepared by [employer] voice dictation technology and any errors that results from this process are unintentional.    Arturo Late, MD 12/03/2023

## 2023-12-13 ENCOUNTER — Telehealth (HOSPITAL_COMMUNITY): Payer: Self-pay | Admitting: *Deleted

## 2023-12-13 DIAGNOSIS — F429 Obsessive-compulsive disorder, unspecified: Secondary | ICD-10-CM

## 2023-12-13 MED ORDER — CLONAZEPAM 0.5 MG PO TABS: 0.5000 mg | ORAL_TABLET | Freq: Three times a day (TID) | ORAL | 1 refills | Status: DC | PRN

## 2023-12-13 NOTE — Telephone Encounter (Signed)
 I called patient and she apologized missing earlier phone call.  She reported Rexulti  helping her depression and she is no longer having any suicidal thoughts.  However sometimes she feel it causing insomnia.  Recommend to take the Rexulti  during the daytime.  We will try Klonopin  0.5 mg 3 times a day.  I called the new prescription of Klonopin  to [facility].  Currently she is taking twice a day and rarely take the third 1 but adding third standing medication is helping her anxiety.  Please make a sooner appointment than in August.

## 2023-12-13 NOTE — Telephone Encounter (Signed)
 Pt called requesting a call back from you regarding the Rexulti . Pt says she was told during most recent visit (12/03/23) to call and request call back from you if she had any issues/questions about the medication. Pt may be reached @ [phone].

## 2023-12-13 NOTE — Telephone Encounter (Signed)
Please see my telephone  encounter note.

## 2023-12-13 NOTE — Telephone Encounter (Signed)
 Writer called pt back and she did answer. Anne Hayden says the Rexulti  is making her more anxious with decreased sleep as well. She has taken extra klonopin  0.5 mg (last sent on 11/18/23 #70) due to increased anxiety. Pt is requesting early fill and is also requesting #90 for next refill. Pt f/u scheduled for 02/18/24. Please review.

## 2024-01-12 ENCOUNTER — Other Ambulatory Visit (HOSPITAL_COMMUNITY): Payer: Self-pay | Admitting: Psychiatry

## 2024-01-12 DIAGNOSIS — F429 Obsessive-compulsive disorder, unspecified: Secondary | ICD-10-CM

## 2024-01-12 DIAGNOSIS — F331 Major depressive disorder, recurrent, moderate: Secondary | ICD-10-CM

## 2024-01-13 ENCOUNTER — Other Ambulatory Visit (HOSPITAL_COMMUNITY): Payer: Self-pay | Admitting: *Deleted

## 2024-01-13 DIAGNOSIS — F331 Major depressive disorder, recurrent, moderate: Secondary | ICD-10-CM

## 2024-01-13 DIAGNOSIS — F429 Obsessive-compulsive disorder, unspecified: Secondary | ICD-10-CM

## 2024-01-13 MED ORDER — BREXPIPRAZOLE 0.25 MG PO TABS: 0.2500 mg | ORAL_TABLET | Freq: Every day | ORAL | 0 refills | Status: DC

## 2024-01-24 ENCOUNTER — Other Ambulatory Visit: Payer: Self-pay | Admitting: Family Medicine

## 2024-01-24 DIAGNOSIS — Z1231 Encounter for screening mammogram for malignant neoplasm of breast: Secondary | ICD-10-CM

## 2024-02-14 ENCOUNTER — Telehealth (HOSPITAL_BASED_OUTPATIENT_CLINIC_OR_DEPARTMENT_OTHER): Admitting: Psychiatry

## 2024-02-14 ENCOUNTER — Encounter (HOSPITAL_COMMUNITY): Payer: Self-pay | Admitting: Psychiatry

## 2024-02-14 VITALS — Wt 100.0 lb

## 2024-02-14 DIAGNOSIS — F429 Obsessive-compulsive disorder, unspecified: Secondary | ICD-10-CM | POA: Diagnosis not present

## 2024-02-14 DIAGNOSIS — F331 Major depressive disorder, recurrent, moderate: Secondary | ICD-10-CM

## 2024-02-14 MED ORDER — CITALOPRAM HYDROBROMIDE 40 MG PO TABS: 40.0000 mg | ORAL_TABLET | Freq: Every day | ORAL | 2 refills | Status: DC

## 2024-02-14 MED ORDER — CLONAZEPAM 0.5 MG PO TABS: 0.5000 mg | ORAL_TABLET | Freq: Three times a day (TID) | ORAL | 1 refills | Status: DC | PRN

## 2024-02-14 MED ORDER — BREXPIPRAZOLE 0.25 MG PO TABS
0.2500 mg | ORAL_TABLET | Freq: Every day | ORAL | 2 refills | Status: DC
Start: 2024-02-14 — End: 2024-05-17

## 2024-02-14 NOTE — Progress Notes (Signed)
 [facility] MD Virtual Progress Note   Patient Location: Home Provider Location: Home Office  I connect with patient by video and verified that I am speaking with correct person by using two identifiers. I discussed the limitations of evaluation and management by telemedicine and the availability of in person appointments. I also discussed with the patient that there may be a patient responsible charge related to this service. The patient expressed understanding and agreed to proceed.  Anne Hayden [id number] 53 y.o.  02/14/2024 8:58 AM  History of Present Illness:  Patient is seen by video session however after a while her camera did not work.  She is having issue with audio and camera.  She reported things are going better since Rexulti  dose reduced and she is only taking 0.25 mg.  She feels less anxious and her sleep is better.  She still have obsessive thoughts but they are not as intense.  She worried about her husband who had recently pancreatitis.  Patient told her husband stopped drinking beer since then.  She reported sometime feeling overwhelmed because she has to cook for her husband and for her mother because of the health reason.  However overall she feels her suicidal thoughts are less intense and less frequent.  She is sleeping better.  Patient lives with her husband and her 52 year old mother.  She denies any agitation, anger, suicidal thoughts at this time.  She denies any tremors shakes or any EPS.  Her appetite is okay and she feels weight is better.  She is on Celexa , Rexulti  0.25 mg and Klonopin  0.5 mg up to 3 times a day.  Sometimes she does not take 3 tablets.  Past Psychiatric History: H/O anxiety, OCD, mania and depression. H/O inpatient at [facility] in 2010. No h/o suicidal attempt.  Seroquel cause weight gain, Risperdal caused nausea, Trintellix cause itching, Geodon, Neurontin, Paxil, Tofranil and Abilify  did not help. Luvox  Caused side  effects.Took Lamictal  worked in [place] but did not work in [employer] . Wellbutrin  caused loss of taste. ECT helped.   We tried Luvox  that did not help   Past Medical History:  Diagnosis Date   Allergy    Depression with anxiety    est. with psychiatry, Dr. Prudencio Velazco. In the past she reports she has been told she was bipolar and OCD   OCD (obsessive compulsive disorder)    Osteoarthritis    Vitamin D  deficiency     Outpatient Encounter Medications as of 02/14/2024  Medication Sig   bimatoprost (LATISSE) 0.03 % ophthalmic solution APPLY TO AFFECTED AREA EVERY DAY AS DIRECTED   brexpiprazole  (REXULTI ) 0.25 MG TABS tablet Take 1 tablet (0.25 mg total) by mouth daily.   Brexpiprazole  (REXULTI ) 0.5 MG TABS Take 1 tablet (0.5 mg total) by mouth daily. (Patient not taking: Reported on 12/03/2023)   citalopram  (CELEXA ) 40 MG tablet Take 1 tablet (40 mg total) by mouth daily.   clindamycin (CLEOCIN T) 1 % lotion clindamycin 1 % lotion   clonazePAM  (KLONOPIN ) 0.5 MG tablet Take 1 tablet (0.5 mg total) by mouth 3 (three) times daily as needed for anxiety.   ketoconazole (NIZORAL) 2 % shampoo ketoconazole 2 % shampoo   norethindrone -ethinyl estradiol  (BALZIVA ) 0.4-35 MG-MCG tablet Take 1 tablet by mouth daily.   nystatin -triamcinolone  ointment (MYCOLOG) Apply 1 Application topically 2 (two) times daily.   Sod Fluoride-Potassium Nitrate 1.1-5 % PSTE PreviDent 5000 Sensitive 1.1 %-5 % dental paste   SPRAVATO, 84 MG DOSE, 28 MG/DEVICE SOPK Place  into both nostrils.   tretinoin  (RETIN-A ) 0.1 % cream Apply topically at bedtime.   VITAMIN D  PO Take by mouth.   No facility-administered encounter medications on file as of 02/14/2024.    Recent Results (from the past 2160 hours)  POCT Urinalysis Dipstick (Automated)     Status: Abnormal   Collection Time: 11/26/23  2:49 PM  Result Value Ref Range   Color, UA yellow    Clarity, UA clear    Glucose, UA Negative Negative   Bilirubin, UA negative    Ketones, UA  negative    Spec Grav, UA 1.020 1.010 - 1.025   Blood, UA trace    pH, UA 6.0 5.0 - 8.0   Protein, UA Positive (A) Negative   Urobilinogen, UA negative (A) 0.2 or 1.0 E.U./dL   Nitrite, UA negative    Leukocytes, UA Negative Negative  Urinalysis, Routine w reflex microscopic     Status: Abnormal   Collection Time: 11/26/23  2:53 PM  Result Value Ref Range   Color, Urine YELLOW YELLOW   APPearance CLOUDY (A) CLEAR   Specific Gravity, Urine 1.018 1.001 - 1.035   pH 5.5 5.0 - 8.0   Glucose, UA NEGATIVE NEGATIVE   Bilirubin Urine NEGATIVE NEGATIVE   Ketones, ur NEGATIVE NEGATIVE   Hgb urine dipstick NEGATIVE NEGATIVE   Protein, ur TRACE (A) NEGATIVE   Nitrite NEGATIVE NEGATIVE   Leukocytes,Ua TRACE (A) NEGATIVE   WBC, UA 10-20 (A) 0 - 5 /HPF   RBC / HPF 0-2 0 - 2 /HPF   Squamous Epithelial / HPF 10-20 (A) < OR = 5 /HPF   Bacteria, UA FEW (A) NONE SEEN /HPF   Hyaline Cast 0-5 (A) NONE SEEN /LPF  MICROSCOPIC MESSAGE     Status: None   Collection Time: 11/26/23  2:53 PM  Result Value Ref Range   Note      Comment: This urine was analyzed for the presence of WBC,  RBC, bacteria, casts, and other formed elements.  Only those elements seen were reported. . .   Urinalysis w microscopic + reflex cultur     Status: None   Collection Time: 11/30/23 10:59 AM   Specimen: Urine  Result Value Ref Range   Color, Urine YELLOW YELLOW   APPearance CLEAR CLEAR   Specific Gravity, Urine 1.004 1.001 - 1.035   pH 7.0 5.0 - 8.0   Glucose, UA NEGATIVE NEGATIVE   Bilirubin Urine NEGATIVE NEGATIVE   Ketones, ur NEGATIVE NEGATIVE   Hgb urine dipstick NEGATIVE NEGATIVE   Protein, ur NEGATIVE NEGATIVE   Nitrites, Initial NEGATIVE NEGATIVE   Leukocyte Esterase NEGATIVE NEGATIVE   WBC, UA NONE SEEN 0 - 5 /HPF   RBC / HPF NONE SEEN 0 - 2 /HPF   Squamous Epithelial / HPF 0-5 < OR = 5 /HPF   Bacteria, UA NONE SEEN NONE SEEN /HPF   Hyaline Cast NONE SEEN NONE SEEN /LPF   Note      Comment: This  urine was analyzed for the presence of WBC,  RBC, bacteria, casts, and other formed elements.  Only those elements seen were reported. . .   REFLEXIVE URINE CULTURE     Status: None   Collection Time: 11/30/23 10:59 AM  Result Value Ref Range   Reflexve Urine Culture      Comment: NO CULTURE INDICATED     Psychiatric Specialty Exam: Physical Exam  Review of Systems  Weight 100 lb (45.4 kg).There is no height  or weight on file to calculate BMI.  General Appearance: Casual  Eye Contact:  Fair  Speech:  Slow  Volume:  Decreased  Mood:  Euthymic  Affect:  Appropriate  Thought Process:  Descriptions of Associations: Intact  Orientation:  Full (Time, Place, and Person)  Thought Content:  Obsessions  Suicidal Thoughts:  No  Homicidal Thoughts:  No  Memory:  Immediate;   Good Recent;   Good Remote;   Good  Judgement:  Intact  Insight:  Present  Psychomotor Activity:  Decreased  Concentration:  Concentration: Fair and Attention Span: Fair  Recall:  Good  Fund of Knowledge:  Good  Language:  Good  Akathisia:  No  Handed:  Right  AIMS (if indicated):     Assets:  Communication Skills Desire for Improvement Housing Social Support Transportation  ADL's:  Intact  Cognition:  WNL  Sleep:  fair       01/25/2020    9:55 AM 01/26/2018   11:07 AM  Depression screen PHQ 2/9  Decreased Interest 0 0  Down, Depressed, Hopeless 1 2  PHQ - 2 Score 1 2  Altered sleeping 0 1  Tired, decreased energy 0 1  Change in appetite 0 0  Feeling bad or failure about yourself  0 2  Trouble concentrating 3 1  Moving slowly or fidgety/restless 0 0  Suicidal thoughts 0 0  PHQ-9 Score 4 7  Difficult doing work/chores Not difficult at all     Assessment/Plan: Obsessive-compulsive disorder, unspecified type - Plan: clonazePAM  (KLONOPIN ) 0.5 MG tablet, citalopram  (CELEXA ) 40 MG tablet, brexpiprazole  (REXULTI ) 0.25 MG TABS tablet  MDD (major depressive disorder), recurrent episode, moderate  (HCC) - Plan: citalopram  (CELEXA ) 40 MG tablet, brexpiprazole  (REXULTI ) 0.25 MG TABS tablet  Review current medication.  Patient had called our office concerning about higher dose of Rexulti  and the dose reduced to 0.25.  She feels comfortable and reported symptoms are much better and she is tolerating the dose.  We will continue Rexulti  0.25 mg daily, Celexa  40 mg daily and she can take the clonazepam  0.5 mg as needed for severe anxiety.  It is prescribed 3 times a day but usually she takes 1 to 2 tablet.  Recommended to call back if she has any question or any concern.  Follow-up in 3 months.   Follow Up Instructions:     I discussed the assessment and treatment plan with the patient. The patient was provided an opportunity to ask questions and all were answered. The patient agreed with the plan and demonstrated an understanding of the instructions.   The patient was advised to call back or seek an in-person evaluation if the symptoms worsen or if the condition fails to improve as anticipated.    Collaboration of Care: Other provider involved in patient's care AEB notes are available in epic to review  Patient/Guardian was advised Release of Information must be obtained prior to any record release in order to collaborate their care with an outside provider. Patient/Guardian was advised if they have not already done so to contact the registration department to sign all necessary forms in order for us  to release information regarding their care.   Consent: Patient/Guardian gives verbal consent for treatment and assignment of benefits for services provided during this visit. Patient/Guardian expressed understanding and agreed to proceed.     Total encounter time 20 minutes which includes face-to-face time, chart reviewed, care coordination, order entry and documentation during this encounter.   Note: This document was  prepared by [employer] and any errors that results  from this process are unintentional.    Leni ONEIDA Client, MD 02/14/2024

## 2024-02-18 ENCOUNTER — Telehealth (HOSPITAL_COMMUNITY): Admitting: Psychiatry

## 2024-02-18 ENCOUNTER — Ambulatory Visit
Admission: RE | Admit: 2024-02-18 | Discharge: 2024-02-18 | Disposition: A | Discharge status: Home / Self Care | Source: Ambulatory Visit | Attending: Family Medicine | Admitting: Family Medicine

## 2024-02-18 DIAGNOSIS — Z1231 Encounter for screening mammogram for malignant neoplasm of breast: Secondary | ICD-10-CM

## 2024-02-21 ENCOUNTER — Ambulatory Visit: Payer: Self-pay | Admitting: Family Medicine

## 2024-02-25 ENCOUNTER — Other Ambulatory Visit: Payer: Self-pay | Admitting: Radiology

## 2024-02-25 ENCOUNTER — Encounter: Payer: Self-pay | Admitting: Family Medicine

## 2024-02-25 DIAGNOSIS — Z3041 Encounter for surveillance of contraceptive pills: Secondary | ICD-10-CM

## 2024-02-25 NOTE — Telephone Encounter (Signed)
 Med refill request: BALZIVA   Last AEX: 02/24/23 Next AEX:03/07/24 Last MMG (if hormonal med) 02/18/24 BIRADS Cat 1 neg  Refill authorized: last rx 02/24/23 #[id number] with 4 refills. Please approve or deny

## 2024-03-07 ENCOUNTER — Ambulatory Visit: Payer: Self-pay | Admitting: Radiology

## 2024-03-21 ENCOUNTER — Encounter: Payer: Self-pay | Admitting: Family Medicine

## 2024-03-21 ENCOUNTER — Ambulatory Visit (INDEPENDENT_AMBULATORY_CARE_PROVIDER_SITE_OTHER): Payer: Self-pay | Admitting: Family Medicine

## 2024-03-21 VITALS — BP 116/74 | HR 70 | Temp 98.1°F | Ht 62.0 in | Wt 103.6 lb

## 2024-03-21 DIAGNOSIS — Z23 Encounter for immunization: Secondary | ICD-10-CM

## 2024-03-21 DIAGNOSIS — Z131 Encounter for screening for diabetes mellitus: Secondary | ICD-10-CM | POA: Diagnosis not present

## 2024-03-21 DIAGNOSIS — Z1231 Encounter for screening mammogram for malignant neoplasm of breast: Secondary | ICD-10-CM

## 2024-03-21 DIAGNOSIS — Z13220 Encounter for screening for lipoid disorders: Secondary | ICD-10-CM | POA: Diagnosis not present

## 2024-03-21 DIAGNOSIS — Z0000 Encounter for general adult medical examination without abnormal findings: Secondary | ICD-10-CM

## 2024-03-21 LAB — COMPREHENSIVE METABOLIC PANEL WITH GFR
ALT: 18 U/L (ref 0–35)
AST: 15 U/L (ref 0–37)
Albumin: 3.8 g/dL (ref 3.5–5.2)
Alkaline Phosphatase: 61 U/L (ref 39–117)
BUN: 10 mg/dL (ref 6–23)
CO2: 28 meq/L (ref 19–32)
Calcium: 8.8 mg/dL (ref 8.4–10.5)
Chloride: 103 meq/L (ref 96–112)
Creatinine, Ser: 0.73 mg/dL (ref 0.40–1.20)
GFR: 94.89 mL/min (ref >60.00)
Glucose, Bld: 69 mg/dL — ABNORMAL LOW (ref 70–99)
Potassium: 3.7 meq/L (ref 3.5–5.1)
Sodium: 138 meq/L (ref 135–145)
Total Bilirubin: 0.5 mg/dL (ref 0.2–1.2)
Total Protein: 6.4 g/dL (ref 6.0–8.3)

## 2024-03-21 LAB — CBC
HCT: 39.6 % (ref 36.0–46.0)
Hemoglobin: 13.3 g/dL (ref 12.0–15.0)
MCHC: 33.6 g/dL (ref 30.0–36.0)
MCV: 88.6 fl (ref 78.0–100.0)
Platelets: 428 K/uL — ABNORMAL HIGH (ref 150.0–400.0)
RBC: 4.47 Mil/uL (ref 3.87–5.11)
RDW: 13.4 % (ref 11.5–15.5)
WBC: 5.4 K/uL (ref 4.0–10.5)

## 2024-03-21 LAB — LIPID PANEL
Cholesterol: 177 mg/dL (ref 0–200)
HDL: 47.6 mg/dL (ref >39.00)
LDL Cholesterol: 116 mg/dL — ABNORMAL HIGH (ref 0–99)
NonHDL: 129.11
Total CHOL/HDL Ratio: 4
Triglycerides: 66 mg/dL (ref 0.0–149.0)
VLDL: 13.2 mg/dL (ref 0.0–40.0)

## 2024-03-21 LAB — TSH: TSH: 5.67 u[IU]/mL — ABNORMAL HIGH (ref 0.35–5.50)

## 2024-03-21 LAB — HEMOGLOBIN A1C: Hgb A1c MFr Bld: 5.5 % (ref 4.6–6.5)

## 2024-03-21 NOTE — Patient Instructions (Addendum)

## 2024-03-21 NOTE — Progress Notes (Signed)
 Patient ID: Anne Hayden, female  DOB: [birth year], 53 y.o.   MRN: [id number] Patient Care Team    Relationship Specialty Notifications Start End  Catherine Charlies LABOR, DO PCP - General Family Medicine  01/26/18   Curry Leni DASEN, MD Consulting Physician Psychiatry  01/27/18   Dianna Specking, MD Consulting Physician Gastroenterology  01/25/20   Ivin Kocher, MD Referring Physician Dermatology  01/25/20   Georgia Blonder, MD Consulting Physician Obstetrics and Gynecology  01/25/20     Chief Complaint  Patient presents with   Annual Exam    Pt is fasting.     Subjective: Anne Hayden is a 53 y.o.  Female  present for CPE. All past medical history, surgical history, allergies, family history, immunizations, medications and social history were updated in the electronic medical record today. All recent labs, ED visits and hospitalizations within the last year were reviewed.  Health maintenance:  Colonoscopy: Patient reports colonoscopy completed August 2020 by Dr. Dianna, follow up 10 years Mammogram: completed:scheduled 02/18/2024 at breast center in [facility]   Cervical cancer screening: last pap: UTD 2023 Dr. Lavoie-Gyn Immunizations: tdap UTD 07/2015, Influenza declined (encouraged yearly).  PNA20 declined,  shingrix series completed. Infectious disease screening: HIV completed 2017, Hep C patient declined screening today. DEXA: routine screen at 61 Assistive device: none Oxygen [id number] Patient has a Dental home. Hospitalizations/ED visits: reviewed      01/25/2020    9:55 AM 01/26/2018   11:07 AM  Depression screen PHQ 2/9  Decreased Interest 0 0  Down, Depressed, Hopeless 1 2  PHQ - 2 Score 1 2  Altered sleeping 0 1  Tired, decreased energy 0 1  Change in appetite 0 0  Feeling bad or failure about yourself  0 2  Trouble concentrating 3 1  Moving slowly or fidgety/restless 0 0  Suicidal thoughts 0 0  PHQ-9 Score 4 7  Difficult doing work/chores Not  difficult at all       01/25/2020    9:56 AM  GAD 7 : Generalized Anxiety Score  Nervous, Anxious, on Edge 3  Control/stop worrying 0  Worry too much - different things 0  Trouble relaxing 1  Restless 0  Easily annoyed or irritable 1  Afraid - awful might happen 0  Total GAD 7 Score 5  Anxiety Difficulty Not difficult at all      05/13/2018    9:08 AM 05/16/2018    9:36 AM 05/18/2018    8:59 AM 02/24/2023    1:26 PM 03/21/2024    9:46 AM  Fall Risk  Falls in the past year?    0 0  Was there an injury with Fall?    0 0  Fall Risk Category Calculator    0 0  (RETIRED) Patient Fall Risk Level       Patient at Risk for Falls Due to    No Fall Risks No Fall Risks  Fall risk Follow up     Falls prevention discussed;Falls evaluation completed     Information is confidential and restricted. Go to Review Flowsheets to unlock data.    Immunization History  Administered Date(s) Administered   Influenza,inj,quad, With Preservative 05/13/2017   Moderna Sars-Covid-2 Vaccination 10/08/2019, 11/05/2019   Tdap 07/14/2015   Zoster Recombinant(Shingrix) 11/11/2021, 01/20/2022     Past Medical History:  Diagnosis Date   Allergy    Depression with anxiety    est. with psychiatry, Dr. Arfeen. In the past she reports she has  been told she was bipolar and OCD   OCD (obsessive compulsive disorder)    Osteoarthritis    Vitamin D  deficiency    No Known Allergies Past Surgical History:  Procedure Laterality Date   CERVICAL BIOPSY  10/08/2016   CIN I-low grade   COLPOSCOPY  2004,2005,2012,2018   TOE SURGERY Right    Family History  Problem Relation Age of Onset   Arthritis Mother    Hypertension Mother    Heart disease Father    Stroke Father    Polycythemia Father    Breast cancer Sister 63   Breast cancer Maternal Grandmother 36   Melanoma Maternal Grandfather    COPD Maternal Grandfather    Cancer Maternal Grandfather        melanoma   Diabetes Paternal Grandmother    Heart  disease Paternal Grandfather    Bipolar disorder Paternal Uncle    Social History   Social History Narrative   Married. Husband lives in [place] through the week. She recently moved to [place] to be closer to her family.    Masters of Arts degree. Unemployed.     Allergies as of 03/21/2024   No Known Allergies      Medication List        Accurate as of March 21, 2024  9:48 AM. If you have any questions, ask your nurse or doctor.          Balziva  0.4-35 MG-MCG tablet Generic drug: norethindrone -ethinyl estradiol  TAKE 1 TABLET BY MOUTH EVERY DAY   bimatoprost 0.03 % ophthalmic solution Commonly known as: LATISSE APPLY TO AFFECTED AREA EVERY DAY AS DIRECTED   brexpiprazole  0.25 MG Tabs tablet Commonly known as: Rexulti  Take 1 tablet (0.25 mg total) by mouth daily.   citalopram  40 MG tablet Commonly known as: CELEXA  Take 1 tablet (40 mg total) by mouth daily.   clindamycin 1 % lotion Commonly known as: CLEOCIN T clindamycin 1 % lotion   clonazePAM  0.5 MG tablet Commonly known as: KLONOPIN  Take 1 tablet (0.5 mg total) by mouth 3 (three) times daily as needed for anxiety.   ketoconazole 2 % shampoo Commonly known as: NIZORAL ketoconazole 2 % shampoo   nystatin -triamcinolone  ointment Commonly known as: MYCOLOG Apply 1 Application topically 2 (two) times daily.   Sod Fluoride-Potassium Nitrate 1.1-5 % Pste PreviDent 5000 Sensitive 1.1 %-5 % dental paste   Spravato (84 MG Dose) 28 MG/DEVICE Sopk Generic drug: Esketamine HCl (84 MG Dose) Place into both nostrils.   tretinoin  0.1 % cream Commonly known as: RETIN-A  Apply topically at bedtime.   VITAMIN D  PO Take by mouth.        All past medical history, surgical history, allergies, family history, immunizations andmedications were updated in the EMR today and reviewed under the history and medication portions of their EMR.     No results found for this or any previous visit (from the past 2160 hours).   No  results found.   ROS 14 pt review of systems performed and negative (unless mentioned in an HPI)  Objective: BP 116/74   Pulse 70   Temp 98.1 F (36.7 C)   Ht 5' 2 (1.575 m)   Wt 103 lb 9.6 oz (47 kg)   SpO2 99%   BMI 18.95 kg/m  Physical Exam Vitals and nursing note reviewed.  Constitutional:      General: She is not in acute distress.    Appearance: Normal appearance. She is not ill-appearing or toxic-appearing.  HENT:  Head: Normocephalic and atraumatic.     Right Ear: Tympanic membrane, ear canal and external ear normal. There is no impacted cerumen.     Left Ear: Tympanic membrane, ear canal and external ear normal. There is no impacted cerumen.     Nose: No congestion or rhinorrhea.     Mouth/Throat:     Mouth: Mucous membranes are moist.     Pharynx: Oropharynx is clear. No oropharyngeal exudate or posterior oropharyngeal erythema.  Eyes:     General: No scleral icterus.       Right eye: No discharge.        Left eye: No discharge.     Extraocular Movements: Extraocular movements intact.     Conjunctiva/sclera: Conjunctivae normal.     Pupils: Pupils are equal, round, and reactive to light.  Cardiovascular:     Rate and Rhythm: Normal rate and regular rhythm.     Pulses: Normal pulses.     Heart sounds: Normal heart sounds. No murmur heard.    No friction rub. No gallop.  Pulmonary:     Effort: Pulmonary effort is normal. No respiratory distress.     Breath sounds: Normal breath sounds. No stridor. No wheezing, rhonchi or rales.  Chest:     Chest wall: No tenderness.  Abdominal:     General: Abdomen is flat. Bowel sounds are normal. There is no distension.     Palpations: Abdomen is soft. There is no mass.     Tenderness: There is no abdominal tenderness. There is no right CVA tenderness, left CVA tenderness, guarding or rebound.     Hernia: No hernia is present.  Musculoskeletal:        General: No swelling, tenderness or deformity. Normal range of  motion.     Cervical back: Normal range of motion and neck supple. No rigidity or tenderness.     Right lower leg: No edema.     Left lower leg: No edema.  Lymphadenopathy:     Cervical: No cervical adenopathy.  Skin:    General: Skin is warm and dry.     Coloration: Skin is not jaundiced or pale.     Findings: No bruising, erythema, lesion or rash.  Neurological:     General: No focal deficit present.     Mental Status: She is alert and oriented to person, place, and time. Mental status is at baseline.     Cranial Nerves: No cranial nerve deficit.     Sensory: No sensory deficit.     Motor: No weakness.     Coordination: Coordination normal.     Gait: Gait normal.     Deep Tendon Reflexes: Reflexes normal.  Psychiatric:        Mood and Affect: Mood normal.        Behavior: Behavior normal.        Thought Content: Thought content normal.        Judgment: Judgment normal.      No results found.  Assessment/plan: Anne Hayden is a 53 y.o. female present for CPE Influenza vaccine needed/Need for vaccination for pneumococcus declined Breast cancer screening by mammogram - MM 3D SCREENING MAMMOGRAM BILATERAL BREAST; Future Diabetes mellitus screening - Hemoglobin A1c Lipid screening - Lipid panel Routine general medical examination at a health care facility (Primary) - CBC - Comprehensive metabolic panel with GFR - TSH Patient was encouraged to exercise greater than 150 minutes a week. Patient was encouraged to choose a diet filled with fresh  fruits and vegetables, and lean meats. AVS provided to patient today for education/recommendation on gender specific health and safety maintenance. Colonoscopy: Patient reports colonoscopy completed August 2020 by Dr. Dianna, follow up 10 years Mammogram: completed:scheduled 02/18/2024 at breast center in [facility]   Cervical cancer screening: last pap: UTD 2023 Dr. Lavoie-Gyn Immunizations: tdap UTD 07/2015, Influenza declined  (encouraged yearly).  PNA20 declined,  shingrix declined Infectious disease screening: HIV completed 2017, Hep C patient declined screening today. DEXA: routine screen at 61  Return in about 1 year (around 03/22/2025) for cpe (20 min).   Orders Placed This Encounter  Procedures   MM 3D SCREENING MAMMOGRAM BILATERAL BREAST   CBC   Comprehensive metabolic panel with GFR   Hemoglobin A1c   Lipid panel   TSH   No orders of the defined types were placed in this encounter.  Referral Orders  No referral(s) requested today     Electronically signed by: Charlies Bellini, DO [facility]

## 2024-03-22 ENCOUNTER — Ambulatory Visit: Payer: Self-pay | Admitting: Family Medicine

## 2024-03-22 DIAGNOSIS — R7989 Other specified abnormal findings of blood chemistry: Secondary | ICD-10-CM | POA: Insufficient documentation

## 2024-04-12 ENCOUNTER — Ambulatory Visit (INDEPENDENT_AMBULATORY_CARE_PROVIDER_SITE_OTHER): Admitting: Radiology

## 2024-04-12 ENCOUNTER — Encounter: Payer: Self-pay | Admitting: Radiology

## 2024-04-12 VITALS — BP 98/62 | HR 79 | Ht 61.75 in | Wt 101.0 lb

## 2024-04-12 DIAGNOSIS — Z01419 Encounter for gynecological examination (general) (routine) without abnormal findings: Secondary | ICD-10-CM | POA: Diagnosis not present

## 2024-04-12 DIAGNOSIS — Z1331 Encounter for screening for depression: Secondary | ICD-10-CM | POA: Diagnosis not present

## 2024-04-12 DIAGNOSIS — Z3041 Encounter for surveillance of contraceptive pills: Secondary | ICD-10-CM

## 2024-04-12 MED ORDER — BALZIVA 0.4-35 MG-MCG PO TABS: 1.0000 | ORAL_TABLET | Freq: Every day | ORAL | 4 refills | Status: AC

## 2024-04-12 NOTE — Progress Notes (Signed)
 Anne Hayden [facility] September 20, 1970 [id number]   History:  53 y.o. G1P0 presents for annual exam.No gyn concerns. Doing well on OCPs, still having regular periods. No perimenopausal symptoms. Followed by PCP, has appt tomorrow.   Gynecologic History Patient's last menstrual period was 03/21/2024 (approximate). Period Cycle (Days): 28 Period Duration (Days): 4 Period Pattern: Regular Menstrual Flow: Light Menstrual Control: Thin pad, Maxi pad Dysmenorrhea: None Contraception/Family planning: OCP (estrogen/progesterone) Sexually active: yes Last Pap: 2023. Results were: normal Last mammogram: 2025. Results were: normal  Obstetric History OB History  Gravida Para Term Preterm AB Living  1 0   1 0  SAB IAB Ectopic Multiple Live Births          # Outcome Date GA Lbr Len/2nd Weight Sex Type Anes PTL Lv  1 AB                04/12/2024   11:32 AM 01/25/2020    9:55 AM 01/26/2018   11:07 AM  Depression screen PHQ 2/9  Decreased Interest 0 0 0  Down, Depressed, Hopeless 1 1 2   PHQ - 2 Score 1 1 2   Altered sleeping  0 1  Tired, decreased energy  0 1  Change in appetite  0 0  Feeling bad or failure about yourself   0 2  Trouble concentrating  3 1  Moving slowly or fidgety/restless  0 0  Suicidal thoughts  0 0  PHQ-9 Score  4 7  Difficult doing work/chores  Not difficult at all      The following portions of the patient's history were reviewed and updated as appropriate: allergies, current medications, past family history, past medical history, past social history, past surgical history, and problem list.  Review of Systems  Constitutional: Negative.   Cardiovascular: Negative.   Gastrointestinal:  Negative for abdominal pain, blood in stool, constipation, nausea and vomiting.  Genitourinary: Negative.  Negative for dysuria, flank pain, frequency, hematuria and urgency.  Endo/Heme/Allergies:  Does not bruise/bleed easily.  Psychiatric/Behavioral: Negative.  Negative for  depression, memory loss and substance abuse. The patient is not nervous/anxious and does not have insomnia.     Past medical history, past surgical history, family history and social history were all reviewed and documented in the EPIC chart.  Exam:  Vitals:   04/12/24 1130  BP: 98/62  Pulse: 79  SpO2: 99%  Weight: 101 lb (45.8 kg)  Height: 5' 1.75 (1.568 m)   Body mass index is 18.62 kg/m.  Physical Exam Vitals and nursing note reviewed. Exam conducted with a chaperone present.  Constitutional:      Appearance: Normal appearance. She is normal weight.  HENT:     Head: Normocephalic and atraumatic.  Neck:     Thyroid: No thyroid mass, thyromegaly or thyroid tenderness.  Cardiovascular:     Rate and Rhythm: Regular rhythm.     Heart sounds: Normal heart sounds.  Pulmonary:     Effort: Pulmonary effort is normal.     Breath sounds: Normal breath sounds.  Chest:  Breasts:    Breasts are symmetrical.     Right: Normal. No inverted nipple, mass, nipple discharge, skin change or tenderness.     Left: Normal. No inverted nipple, mass, nipple discharge, skin change or tenderness.  Abdominal:     General: Abdomen is flat. Bowel sounds are normal.     Palpations: Abdomen is soft.  Genitourinary:    General: Normal vulva.     Vagina: Normal. No vaginal discharge,  bleeding or lesions.     Cervix: Normal. No discharge or lesion.     Uterus: Normal. Not enlarged and not tender.      Adnexa: Right adnexa normal and left adnexa normal.       Right: No mass, tenderness or fullness.         Left: No mass, tenderness or fullness.    Lymphadenopathy:     Upper Body:     Right upper body: No axillary adenopathy.     Left upper body: No axillary adenopathy.  Skin:    General: Skin is warm and dry.  Neurological:     Mental Status: She is alert and oriented to person, place, and time.  Psychiatric:        Mood and Affect: Mood normal.        Thought Content: Thought content  normal.        Judgment: Judgment normal.      Darice Hoit, CMA present for exam  Assessment/Plan:   1. Well woman exam with routine gynecological exam (Primary) Pap 2026 Yearly mammo Lab with PCP  2. Encounter for surveillance of contraceptive pills - norethindrone -ethinyl estradiol  (BALZIVA ) 0.4-35 MG-MCG tablet; Take 1 tablet by mouth daily.  Dispense: 84 tablet; Refill: 4  3. Depression screen     Return in about 1 year (around 04/12/2025) for Annual.  GINETTE SHASTA NOVAK WHNP-BC 11:49 AM 04/12/2024

## 2024-04-12 NOTE — Patient Instructions (Signed)
 Preventive Care 52-52 Years Old, Female  Preventive care refers to lifestyle choices and visits with your health care provider that can promote health and wellness. Preventive care visits are also called wellness exams.  What can I expect for my preventive care visit?  Counseling  Your health care provider may ask you questions about your:  Medical history, including:  Past medical problems.  Family medical history.  Pregnancy history.  Current health, including:  Menstrual cycle.  Method of birth control.  Emotional well-being.  Home life and relationship well-being.  Sexual activity and sexual health.  Lifestyle, including:  Alcohol, nicotine or tobacco, and drug use.  Access to firearms.  Diet, exercise, and sleep habits.  Work and work Astronomer.  Sunscreen use.  Safety issues such as seatbelt and bike helmet use.  Physical exam  Your health care provider will check your:  Height and weight. These may be used to calculate your BMI (body mass index). BMI is a measurement that tells if you are at a healthy weight.  Waist circumference. This measures the distance around your waistline. This measurement also tells if you are at a healthy weight and may help predict your risk of certain diseases, such as type 2 diabetes and high blood pressure.  Heart rate and blood pressure.  Body temperature.  Skin for abnormal spots.  What immunizations do I need?    Vaccines are usually given at various ages, according to a schedule. Your health care provider will recommend vaccines for you based on your age, medical history, and lifestyle or other factors, such as travel or where you work.  What tests do I need?  Screening  Your health care provider may recommend screening tests for certain conditions. This may include:  Lipid and cholesterol levels.  Diabetes screening. This is done by checking your blood sugar (glucose) after you have not eaten for a while (fasting).  Pelvic exam and Pap test.  Hepatitis B test.  Hepatitis C  test.  HIV (human immunodeficiency virus) test.  STI (sexually transmitted infection) testing, if you are at risk.  Lung cancer screening.  Colorectal cancer screening.  Mammogram. Talk with your health care provider about when you should start having regular mammograms. This may depend on whether you have a family history of breast cancer.  BRCA-related cancer screening. This may be done if you have a family history of breast, ovarian, tubal, or peritoneal cancers.  Bone density scan. This is done to screen for osteoporosis.  Talk with your health care provider about your test results, treatment options, and if necessary, the need for more tests.  Follow these instructions at home:  Eating and drinking    Eat a diet that includes fresh fruits and vegetables, whole grains, lean protein, and low-fat dairy products.  Take vitamin and mineral supplements as recommended by your health care provider.  Do not drink alcohol if:  Your health care provider tells you not to drink.  You are pregnant, may be pregnant, or are planning to become pregnant.  If you drink alcohol:  Limit how much you have to 0-1 drink a day.  Know how much alcohol is in your drink. In the [place] one drink equals one 12 oz bottle of beer (355 mL), one 5 oz glass of wine (148 mL), or one 1 oz glass of hard liquor (44 mL).  Lifestyle  Brush your teeth every morning and night with fluoride toothpaste. Floss one time each day.  Exercise for at least  30 minutes 5 or more days each week.  Do not use any products that contain nicotine or tobacco. These products include cigarettes, chewing tobacco, and vaping devices, such as e-cigarettes. If you need help quitting, ask your health care provider.  Do not use drugs.  If you are sexually active, practice safe sex. Use a condom or other form of protection to prevent STIs.  If you do not wish to become pregnant, use a form of birth control. If you plan to become pregnant, see your health care provider for a  prepregnancy visit.  Take aspirin only as told by your health care provider. Make sure that you understand how much to take and what form to take. Work with your health care provider to find out whether it is safe and beneficial for you to take aspirin daily.  Find healthy ways to manage stress, such as:  Meditation, yoga, or listening to music.  Journaling.  Talking to a trusted person.  Spending time with friends and family.  Minimize exposure to UV radiation to reduce your risk of skin cancer.  Safety  Always wear your seat belt while driving or riding in a vehicle.  Do not drive:  If you have been drinking alcohol. Do not ride with someone who has been drinking.  When you are tired or distracted.  While texting.  If you have been using any mind-altering substances or drugs.  Wear a helmet and other protective equipment during sports activities.  If you have firearms in your house, make sure you follow all gun safety procedures.  Seek help if you have been physically or sexually abused.  What's next?  Visit your health care provider once a year for an annual wellness visit.  Ask your health care provider how often you should have your eyes and teeth checked.  Stay up to date on all vaccines.  This information is not intended to replace advice given to you by your health care provider. Make sure you discuss any questions you have with your health care provider.  Document Revised: 12/25/2020 Document Reviewed: 12/25/2020  Elsevier Patient Education  2024 [employer].

## 2024-04-13 ENCOUNTER — Encounter: Payer: Self-pay | Admitting: Family Medicine

## 2024-04-13 ENCOUNTER — Ambulatory Visit (INDEPENDENT_AMBULATORY_CARE_PROVIDER_SITE_OTHER): Admitting: Family Medicine

## 2024-04-13 VITALS — BP 100/70 | HR 83 | Temp 97.9°F | Wt 104.0 lb

## 2024-04-13 DIAGNOSIS — R946 Abnormal results of thyroid function studies: Secondary | ICD-10-CM | POA: Diagnosis not present

## 2024-04-13 DIAGNOSIS — R7989 Other specified abnormal findings of blood chemistry: Secondary | ICD-10-CM | POA: Diagnosis not present

## 2024-04-13 LAB — T4, FREE: Free T4: 0.91 ng/dL (ref 0.60–1.60)

## 2024-04-13 LAB — TSH: TSH: 3.9 u[IU]/mL (ref 0.35–5.50)

## 2024-04-13 NOTE — Patient Instructions (Signed)

## 2024-04-13 NOTE — Progress Notes (Signed)
 Anne Hayden , [birth year], 53 y.o., female MRN: [id number] Patient Care Team    Relationship Specialty Notifications Start End  Catherine Charlies LABOR, DO PCP - General Family Medicine  01/26/18   Curry Leni DASEN, MD Consulting Physician Psychiatry  01/27/18   Dianna Specking, MD Consulting Physician Gastroenterology  01/25/20   Ivin Kocher, MD Referring Physician Dermatology  01/25/20   Georgia Blonder, MD Consulting Physician Obstetrics and Gynecology  01/25/20     Chief Complaint  Patient presents with   Abnormal TSH     Subjective: Anne Hayden is a 53 y.o. Pt presents for an OV to follow-up on abnormal TSH.  Patient was seen for her physical 4 weeks ago and incidentally found an elevated TSH on preventative labs.  Patient is mainly hypothyroidism symptoms, prior to the elevation in lab, making it difficult for her to know if she is having worsening symptoms because of the abnormal thyroid function or not.     04/12/2024   11:32 AM 01/25/2020    9:55 AM 01/26/2018   11:07 AM  Depression screen PHQ 2/9  Decreased Interest 0 0 0  Down, Depressed, Hopeless 1 1 2   PHQ - 2 Score 1 1 2   Altered sleeping  0 1  Tired, decreased energy  0 1  Change in appetite  0 0  Feeling bad or failure about yourself   0 2  Trouble concentrating  3 1  Moving slowly or fidgety/restless  0 0  Suicidal thoughts  0 0  PHQ-9 Score  4 7  Difficult doing work/chores  Not difficult at all     No Known Allergies Social History   Social History Narrative   Married. Husband lives in [place] through the week. She recently moved to [place] to be closer to her family.    Masters of Arts degree. Unemployed.    Past Medical History:  Diagnosis Date   Allergy    Depression with anxiety    est. with psychiatry, Dr. Arfeen. In the past she reports she has been told she was bipolar and OCD   OCD (obsessive compulsive disorder)    Osteoarthritis    Vitamin D  deficiency    Past Surgical History:   Procedure Laterality Date   CERVICAL BIOPSY  10/08/2016   CIN I-low grade   COLPOSCOPY  2004,2005,2012,2018   TOE SURGERY Right    Family History  Problem Relation Age of Onset   Arthritis Mother    Hypertension Mother    Heart disease Father    Stroke Father    Polycythemia Father    Breast cancer Sister 30   Breast cancer Maternal Grandmother 16   Melanoma Maternal Grandfather    COPD Maternal Grandfather    Cancer Maternal Grandfather        melanoma   Diabetes Paternal Grandmother    Heart disease Paternal Grandfather    Bipolar disorder Paternal Uncle    Allergies as of 04/13/2024   No Known Allergies      Medication List        Accurate as of April 13, 2024  9:54 AM. If you have any questions, ask your nurse or doctor.          STOP taking these medications    ketoconazole 2 % shampoo Commonly known as: NIZORAL Stopped by: Charlies Catherine       TAKE these medications    Balziva  0.4-35 MG-MCG tablet Generic drug: norethindrone -ethinyl estradiol  Take  1 tablet by mouth daily.   bimatoprost 0.03 % ophthalmic solution Commonly known as: LATISSE APPLY TO AFFECTED AREA EVERY DAY AS DIRECTED   brexpiprazole  0.25 MG Tabs tablet Commonly known as: Rexulti  Take 1 tablet (0.25 mg total) by mouth daily.   citalopram  40 MG tablet Commonly known as: CELEXA  Take 1 tablet (40 mg total) by mouth daily.   clindamycin 1 % lotion Commonly known as: CLEOCIN T clindamycin 1 % lotion   clonazePAM  0.5 MG tablet Commonly known as: KLONOPIN  Take 1 tablet (0.5 mg total) by mouth 3 (three) times daily as needed for anxiety.   nystatin -triamcinolone  ointment Commonly known as: MYCOLOG Apply 1 Application topically 2 (two) times daily.   Sod Fluoride-Potassium Nitrate 1.1-5 % Pste PreviDent 5000 Sensitive 1.1 %-5 % dental paste   Spravato (84 MG Dose) 28 MG/DEVICE Sopk Generic drug: Esketamine HCl (84 MG Dose) Place into both nostrils.   tretinoin  0.1 %  cream Commonly known as: RETIN-A  Apply topically at bedtime.   VITAMIN D  PO Take by mouth.        All past medical history, surgical history, allergies, family history, immunizations andmedications were updated in the EMR today and reviewed under the history and medication portions of their EMR.     Review of Systems  Constitutional:  Positive for malaise/fatigue.  Cardiovascular:  Negative for palpitations.  Gastrointestinal:  Positive for constipation.  Psychiatric/Behavioral:  Positive for depression. The patient is nervous/anxious.    Negative, with the exception of above mentioned in HPI   Objective:  BP 100/70   Pulse 83   Temp 97.9 F (36.6 C)   Wt 104 lb (47.2 kg)   LMP 03/21/2024 (Approximate)   SpO2 98%   BMI 19.18 kg/m  Body mass index is 19.18 kg/m. Physical Exam Vitals and nursing note reviewed.  Constitutional:      General: She is not in acute distress.    Appearance: Normal appearance. She is normal weight. She is not ill-appearing or toxic-appearing.  HENT:     Head: Normocephalic and atraumatic.  Eyes:     General: No scleral icterus.       Right eye: No discharge.        Left eye: No discharge.     Extraocular Movements: Extraocular movements intact.     Conjunctiva/sclera: Conjunctivae normal.     Pupils: Pupils are equal, round, and reactive to light.  Neck:     Comments: No thyromegaly Cardiovascular:     Rate and Rhythm: Normal rate and regular rhythm.     Heart sounds: No murmur heard. Musculoskeletal:     Cervical back: Neck supple. No tenderness.  Lymphadenopathy:     Cervical: No cervical adenopathy.  Skin:    Findings: No rash.  Neurological:     Mental Status: She is alert and oriented to person, place, and time. Mental status is at baseline.     Motor: No weakness.     Coordination: Coordination normal.     Gait: Gait normal.  Psychiatric:        Mood and Affect: Mood normal.        Behavior: Behavior normal.         Thought Content: Thought content normal.        Judgment: Judgment normal.      No results found. No results found. No results found for this or any previous visit (from the past 24 hours).  Assessment/Plan: Anne Hayden is a 53 y.o. female present  for OV for  Abnormal TSH We discussed normal lab values for a female her age.  We discussed TSH levels wax/wan naturally and can be normal. If levels remain abnormal, we discussed what type of medication would be started and how it is taken daily on an empty stomach. TSH, T4 free and TPO collected today. Patient will be called with results of medication start if appropriate.  If medication started and patient will follow-up in 6-8 weeks.  Reviewed expectations re: course of current medical issues. Discussed self-management of symptoms. Outlined signs and symptoms indicating need for more acute intervention. Patient verbalized understanding and all questions were answered. Patient received an After-Visit Summary.    Orders Placed This Encounter  Procedures   TSH   T4, free   Thyroid peroxidase antibody   No orders of the defined types were placed in this encounter.  Referral Orders  No referral(s) requested today     Note is dictated utilizing voice recognition software. Although note has been proof read prior to signing, occasional typographical errors still can be missed. If any questions arise, please do not hesitate to call for verification.   electronically signed by:  Charlies Bellini, DO  [facility] [place]

## 2024-04-14 ENCOUNTER — Ambulatory Visit: Payer: Self-pay | Admitting: Family Medicine

## 2024-04-17 LAB — THYROID PEROXIDASE ANTIBODY: Thyroperoxidase Ab SerPl-aCnc: <1 [IU]/mL (ref <9)

## 2024-05-02 DIAGNOSIS — K130 Diseases of lips: Secondary | ICD-10-CM | POA: Diagnosis not present

## 2024-05-02 DIAGNOSIS — D225 Melanocytic nevi of trunk: Secondary | ICD-10-CM | POA: Diagnosis not present

## 2024-05-17 ENCOUNTER — Other Ambulatory Visit (HOSPITAL_COMMUNITY): Payer: Self-pay | Admitting: *Deleted

## 2024-05-17 ENCOUNTER — Other Ambulatory Visit (HOSPITAL_COMMUNITY): Payer: Self-pay | Admitting: Psychiatry

## 2024-05-17 DIAGNOSIS — F429 Obsessive-compulsive disorder, unspecified: Secondary | ICD-10-CM

## 2024-05-17 DIAGNOSIS — F331 Major depressive disorder, recurrent, moderate: Secondary | ICD-10-CM

## 2024-05-17 MED ORDER — BREXPIPRAZOLE 0.25 MG PO TABS: 0.2500 mg | ORAL_TABLET | Freq: Every day | ORAL | 0 refills | Status: DC

## 2024-05-18 ENCOUNTER — Ambulatory Visit (HOSPITAL_COMMUNITY): Admitting: Psychiatry

## 2024-05-19 ENCOUNTER — Ambulatory Visit: Admitting: Family Medicine

## 2024-05-22 ENCOUNTER — Other Ambulatory Visit (HOSPITAL_COMMUNITY): Payer: Self-pay | Admitting: *Deleted

## 2024-05-22 DIAGNOSIS — F429 Obsessive-compulsive disorder, unspecified: Secondary | ICD-10-CM

## 2024-05-22 MED ORDER — CLONAZEPAM 0.5 MG PO TABS: 0.5000 mg | ORAL_TABLET | Freq: Three times a day (TID) | ORAL | 0 refills | Status: DC | PRN

## 2024-05-23 ENCOUNTER — Ambulatory Visit (HOSPITAL_COMMUNITY): Admitting: *Deleted

## 2024-05-23 ENCOUNTER — Encounter (HOSPITAL_COMMUNITY): Payer: Self-pay | Admitting: Psychiatry

## 2024-05-23 ENCOUNTER — Telehealth (HOSPITAL_COMMUNITY): Admitting: Psychiatry

## 2024-05-23 ENCOUNTER — Telehealth (HOSPITAL_COMMUNITY): Payer: Self-pay | Admitting: *Deleted

## 2024-05-23 ENCOUNTER — Other Ambulatory Visit (HOSPITAL_COMMUNITY): Payer: Self-pay | Admitting: Psychiatry

## 2024-05-23 VITALS — Wt 97.0 lb

## 2024-05-23 DIAGNOSIS — F429 Obsessive-compulsive disorder, unspecified: Secondary | ICD-10-CM

## 2024-05-23 DIAGNOSIS — F331 Major depressive disorder, recurrent, moderate: Secondary | ICD-10-CM

## 2024-05-23 DIAGNOSIS — F4011 Social phobia, generalized: Secondary | ICD-10-CM | POA: Diagnosis not present

## 2024-05-23 DIAGNOSIS — F4010 Social phobia, unspecified: Secondary | ICD-10-CM | POA: Diagnosis not present

## 2024-05-23 DIAGNOSIS — F321 Major depressive disorder, single episode, moderate: Secondary | ICD-10-CM | POA: Diagnosis not present

## 2024-05-23 MED ORDER — FLUOXETINE HCL 10 MG PO CAPS: ORAL_CAPSULE | ORAL | 0 refills | Status: DC

## 2024-05-23 MED ORDER — BREXPIPRAZOLE 0.25 MG PO TABS: 0.2500 mg | ORAL_TABLET | Freq: Every day | ORAL | 0 refills | Status: DC

## 2024-05-23 MED ORDER — CLONAZEPAM 0.5 MG PO TABS: 0.5000 mg | ORAL_TABLET | Freq: Three times a day (TID) | ORAL | 0 refills | Status: DC | PRN

## 2024-05-23 NOTE — Telephone Encounter (Signed)
 Pt in clinic today for GeneSight testing. Information given. Consent signed by pt. Pt advised that results will be discussed on return visit on 06/15/24 or sooner if needed. Pt verbalizes understanding.

## 2024-05-23 NOTE — Progress Notes (Signed)
  Health MD Virtual Progress Note   Patient Location: Home Provider Location: Home Office  I connect with patient by video and verified that I am speaking with correct person by using two identifiers. I discussed the limitations of evaluation and management by telemedicine and the availability of in person appointments. I also discussed with the patient that there may be a patient responsible charge related to this service. The patient expressed understanding and agreed to proceed.  Anne Hayden [id number] 53 y.o.  05/23/2024 9:23 AM  History of Present Illness:  Patient is evaluated by video session.  She reported started to feel very anxious, nervous and having a lot of obsessive thoughts about her health and her husband's health.  Apparently patient's husband was admitted in the hospital for pancreatitis and since then she has been very nervous and anxious.  She does not leave the house.  She feels very anxious around people.  There are few times she has to travel with the husband for his conference but she stayed in the hotel because she was very scared nervous and anxious leaving the room.  She is not sleeping well.  She had lost 3 pounds since the last visit.  She is taking Klonopin  up to 3 times a day.  For past 2 nights she was not able to take the Klonopin  because she ran out and had a hard time getting sleep.  Patient told she obsess about religion, politics, health and finances.  She is also in the process of changing her insurance so she can afford her medication.  She is taking Rexulti  0.25 and in the past higher dose make her more anxious.  She denies any suicidal thoughts or homicidal thoughts but a lot of ruminative and negative thoughts.  She has no tremor or shakes or any EPS.  She is taking Celexa  and so far no side effects of medication.  She denies drinking.  Past Psychiatric History: H/O anxiety, OCD, mania and depression. H/O inpatient at [facility] ([place] in 2010. No h/o suicidal attempt.  Seroquel cause weight gain, Risperdal caused nausea, Trintellix cause itching, Geodon, Neurontin, Paxil, Tofranil and Abilify  did not help. Luvox  Caused side effects.Took Lamictal  worked in [place] but did not work in [employer] . Wellbutrin  caused loss of taste. ECT helped.   We tried Luvox  that did not help   Past Medical History:  Diagnosis Date   Allergy    Depression with anxiety    est. with psychiatry, Dr. Edlyn Rosenburg. In the past she reports she has been told she was bipolar and OCD   OCD (obsessive compulsive disorder)    Osteoarthritis    Vitamin D  deficiency     Outpatient Encounter Medications as of 05/23/2024  Medication Sig   bimatoprost (LATISSE) 0.03 % ophthalmic solution APPLY TO AFFECTED AREA EVERY DAY AS DIRECTED   brexpiprazole  (REXULTI ) 0.25 MG TABS tablet Take 1 tablet (0.25 mg total) by mouth daily.   citalopram  (CELEXA ) 40 MG tablet Take 1 tablet (40 mg total) by mouth daily.   clindamycin (CLEOCIN T) 1 % lotion clindamycin 1 % lotion   clonazePAM  (KLONOPIN ) 0.5 MG tablet Take 1 tablet (0.5 mg total) by mouth 3 (three) times daily as needed for anxiety.   norethindrone -ethinyl estradiol  (BALZIVA ) 0.4-35 MG-MCG tablet Take 1 tablet by mouth daily.   nystatin -triamcinolone  ointment (MYCOLOG) Apply 1 Application topically 2 (two) times daily.   Sod Fluoride-Potassium Nitrate 1.1-5 % PSTE PreviDent 5000 Sensitive 1.1 %-5 % dental paste  SPRAVATO, 84 MG DOSE, 28 MG/DEVICE SOPK Place into both nostrils. (Patient not taking: Reported on 04/13/2024)   tretinoin  (RETIN-A ) 0.1 % cream Apply topically at bedtime.   VITAMIN D  PO Take by mouth.   No facility-administered encounter medications on file as of 05/23/2024.    Recent Results (from the past 2160 hours)  CBC     Status: Abnormal   Collection Time: 03/21/24  9:26 AM  Result Value Ref Range   WBC 5.4 4.0 - 10.5 K/uL   RBC 4.47 3.87 - 5.11 Mil/uL   Platelets 428.0 (H) 150.0 - 400.0 K/uL    Hemoglobin 13.3 12.0 - 15.0 g/dL   HCT 60.3 63.9 - 53.9 %   MCV 88.6 78.0 - 100.0 fl   MCHC 33.6 30.0 - 36.0 g/dL   RDW 86.5 88.4 - 84.4 %  Comprehensive metabolic panel with GFR     Status: Abnormal   Collection Time: 03/21/24  9:26 AM  Result Value Ref Range   Sodium 138 135 - 145 mEq/L   Potassium 3.7 3.5 - 5.1 mEq/L   Chloride 103 96 - 112 mEq/L   CO2 28 19 - 32 mEq/L   Glucose, Bld 69 (L) 70 - 99 mg/dL   BUN 10 6 - 23 mg/dL   Creatinine, Ser 9.26 0.40 - 1.20 mg/dL   Total Bilirubin 0.5 0.2 - 1.2 mg/dL   Alkaline Phosphatase 61 39 - 117 U/L   AST 15 0 - 37 U/L   ALT 18 0 - 35 U/L   Total Protein 6.4 6.0 - 8.3 g/dL   Albumin 3.8 3.5 - 5.2 g/dL   GFR 05.10 >39.99 mL/min    Comment: Calculated using the CKD-EPI Creatinine Equation (2021)   Calcium 8.8 8.4 - 10.5 mg/dL  Hemoglobin J8r     Status: None   Collection Time: 03/21/24  9:26 AM  Result Value Ref Range   Hgb A1c MFr Bld 5.5 4.6 - 6.5 %    Comment: Glycemic Control Guidelines for People with Diabetes:Non Diabetic:  <6%Goal of Therapy: <7%Additional Action Suggested:  >8%   Lipid panel     Status: Abnormal   Collection Time: 03/21/24  9:26 AM  Result Value Ref Range   Cholesterol 177 0 - 200 mg/dL    Comment: ATP III Classification       Desirable:  < 200 mg/dL               Borderline High:  200 - 239 mg/dL          High:  > = 759 mg/dL   Triglycerides 33.9 0.0 - 149.0 mg/dL    Comment: Normal:  <849 mg/dLBorderline High:  150 - 199 mg/dL   HDL 52.39 >60.99 mg/dL   VLDL 86.7 0.0 - 59.9 mg/dL   LDL Cholesterol 883 (H) 0 - 99 mg/dL   Total CHOL/HDL Ratio 4     Comment:                Men          Women1/2 Average Risk     3.4          3.3Average Risk          5.0          4.42X Average Risk          9.6          7.13X Average Risk          15.0  11.0                       NonHDL 129.11     Comment: NOTE:  Non-HDL goal should be 30 mg/dL higher than patient's LDL goal (i.e. LDL goal of < 70 mg/dL, would have  non-HDL goal of < 100 mg/dL)  TSH     Status: Abnormal   Collection Time: 03/21/24  9:26 AM  Result Value Ref Range   TSH 5.67 (H) 0.35 - 5.50 uIU/mL  TSH     Status: None   Collection Time: 04/13/24  9:55 AM  Result Value Ref Range   TSH 3.90 0.35 - 5.50 uIU/mL  T4, free     Status: None   Collection Time: 04/13/24  9:55 AM  Result Value Ref Range   Free T4 0.91 0.60 - 1.60 ng/dL    Comment: Specimens from patients who are undergoing biotin therapy and /or ingesting biotin supplements may contain high levels of biotin.  The higher biotin concentration in these specimens interferes with this Free T4 assay.  Specimens that contain high levels  of biotin may cause false high results for this Free T4 assay.  Please interpret results in light of the total clinical presentation of the patient.    Thyroid peroxidase antibody     Status: None   Collection Time: 04/13/24  9:55 AM  Result Value Ref Range   Thyroperoxidase Ab SerPl-aCnc <1 <9 IU/mL     Psychiatric Specialty Exam: Physical Exam  Review of Systems  Weight 97 lb (44 kg).There is no height or weight on file to calculate BMI.  General Appearance: Casual  Eye Contact:  Fair  Speech:  Slow  Volume:  Decreased  Mood:  Anxious  Affect:  Constricted  Thought Process:  Descriptions of Associations: Intact  Orientation:  Full (Time, Place, and Person)  Thought Content:  Obsessions and Rumination  Suicidal Thoughts:  No  Homicidal Thoughts:  No  Memory:  Immediate;   Good Recent;   Fair Remote;   Fair  Judgement:  Fair  Insight:  Shallow  Psychomotor Activity:  Decreased  Concentration:  Concentration: Fair and Attention Span: Fair  Recall:  Good  Fund of Knowledge:  Good  Language:  Good  Akathisia:  No  Handed:  Right  AIMS (if indicated):     Assets:  Communication Skills Desire for Improvement Housing Social Support Transportation  ADL's:  Intact  Cognition:  WNL  Sleep:  fair       04/12/2024   11:32 AM  01/25/2020    9:55 AM 01/26/2018   11:07 AM  Depression screen PHQ 2/9  Decreased Interest 0 0 0  Down, Depressed, Hopeless 1 1 2   PHQ - 2 Score 1 1 2   Altered sleeping  0 1  Tired, decreased energy  0 1  Change in appetite  0 0  Feeling bad or failure about yourself   0 2  Trouble concentrating  3 1  Moving slowly or fidgety/restless  0 0  Suicidal thoughts  0 0  PHQ-9 Score  4  7   Difficult doing work/chores  Not difficult at all      Data saved with a previous flowsheet row definition    Assessment/Plan: MDD (major depressive disorder), recurrent episode, moderate (HCC) - Plan: FLUoxetine (PROZAC) 10 MG capsule, brexpiprazole  (REXULTI ) 0.25 MG TABS tablet  Obsessive-compulsive disorder, unspecified type - Plan: clonazePAM  (KLONOPIN ) 0.5 MG tablet, FLUoxetine (PROZAC) 10 MG  capsule, brexpiprazole  (REXULTI ) 0.25 MG TABS tablet  Social anxiety disorder - Plan: FLUoxetine (PROZAC) 10 MG capsule  Patient is 53 year old Caucasian married female with history of major depressive disorder, OCD and social anxiety disorder.  She admitted anything triggered her obsessive thoughts and recently husband's hospitalization has causes a lot of anxiety and obsession.  In the past she had tried multiple medication with limited response.  Higher dose of Rexulti  caused more anxious.  She never had GeneSight testing.  She also never took the Prozac.  Recommend to try Prozac to help the social anxiety and obsessive thoughts.  Will reduce Celexa  from 40 mg to cut down to 20 mg for 1 week and start Prozac 10 mg daily for 1 week and 20 mg daily.  Will keep the Klonopin  0.5 mg up to 3 times a day to help with the anxiety and Rexulti  0.25 mg daily.  We will consider reducing the Klonopin  once we have better response on Prozac.  Treatment plan discussed with the patient in detail and she acknowledged and verbalized and agreed with the plan.  She will come today for GeneSight testing.  Patient has appointment in  person in 4 weeks and I recommend to keep that appointment.  Discussed 50 concerns at any time having active suicidal thoughts or homicidal thoughts and she need to call 911 or go to local emergency room.  Follow-up in 4 weeks.   Follow Up Instructions:     I discussed the assessment and treatment plan with the patient. The patient was provided an opportunity to ask questions and all were answered. The patient agreed with the plan and demonstrated an understanding of the instructions.   The patient was advised to call back or seek an in-person evaluation if the symptoms worsen or if the condition fails to improve as anticipated.    Collaboration of Care: Other provider involved in patient's care AEB notes are available in epic to review  Patient/Guardian was advised Release of Information must be obtained prior to any record release in order to collaborate their care with an outside provider. Patient/Guardian was advised if they have not already done so to contact the registration department to sign all necessary forms in order for us  to release information regarding their care.   Consent: Patient/Guardian gives verbal consent for treatment and assignment of benefits for services provided during this visit. Patient/Guardian expressed understanding and agreed to proceed.     Total encounter time 24 minutes which includes face-to-face time, chart reviewed, care coordination, order entry and documentation during this encounter.   Note: This document was prepared by [employer] voice dictation technology and any errors that results from this process are unintentional.    Leni ONEIDA Client, MD 05/23/2024

## 2024-06-14 ENCOUNTER — Other Ambulatory Visit (HOSPITAL_COMMUNITY): Payer: Self-pay | Admitting: Psychiatry

## 2024-06-14 DIAGNOSIS — F331 Major depressive disorder, recurrent, moderate: Secondary | ICD-10-CM

## 2024-06-14 DIAGNOSIS — F4010 Social phobia, unspecified: Secondary | ICD-10-CM

## 2024-06-14 DIAGNOSIS — F429 Obsessive-compulsive disorder, unspecified: Secondary | ICD-10-CM

## 2024-06-15 ENCOUNTER — Encounter (HOSPITAL_COMMUNITY): Payer: Self-pay | Admitting: *Deleted

## 2024-06-15 ENCOUNTER — Other Ambulatory Visit: Payer: Self-pay

## 2024-06-15 ENCOUNTER — Ambulatory Visit (HOSPITAL_COMMUNITY): Admitting: Psychiatry

## 2024-06-15 ENCOUNTER — Encounter (HOSPITAL_COMMUNITY): Payer: Self-pay | Admitting: Psychiatry

## 2024-06-15 VITALS — BP 131/83 | HR 85 | Resp 18 | Ht 61.0 in | Wt 98.8 lb

## 2024-06-15 DIAGNOSIS — F4010 Social phobia, unspecified: Secondary | ICD-10-CM

## 2024-06-15 DIAGNOSIS — F331 Major depressive disorder, recurrent, moderate: Secondary | ICD-10-CM | POA: Diagnosis not present

## 2024-06-15 DIAGNOSIS — F429 Obsessive-compulsive disorder, unspecified: Secondary | ICD-10-CM | POA: Diagnosis not present

## 2024-06-15 MED ORDER — SERTRALINE HCL 50 MG PO TABS: 50.0000 mg | ORAL_TABLET | Freq: Every day | ORAL | 0 refills | Status: AC

## 2024-06-15 MED ORDER — CLONAZEPAM 0.5 MG PO TABS: 0.5000 mg | ORAL_TABLET | Freq: Three times a day (TID) | ORAL | 0 refills | Status: DC | PRN

## 2024-06-15 NOTE — Progress Notes (Signed)
 [facility] MD/PA/NP OP Progress Note  06/15/2024 9:31 AM JULY LINAM  MRN:  [id number]  Chief Complaint:  Chief Complaint  Patient presents with   Anxiety   Depression   Follow-up   HPI: Patient came to the office for her follow-up appointment.  She was last seen in November.  We started her on Rexulti  but she noticed increase anxiety, tearfulness, nervousness and does not want to do anything.  She is not sure if the Rexulti  causing the side effects.  We also recommend to start Prozac  and discontinue Celexa .  She has been on Celexa  for a while and has not seen significant improvement.  Now patient feels that she is going backwards and very concerned about her future.  She worried about finances, husband's health, her marriage.  Patient has a trip coming up in January to go to [place] with her mother.  Patient now thinking to cancel because she could not travel and feel very nervous and anxious.  She denies any active suicidal thoughts but has a lot of ruminative and negative thoughts.  Sometimes she feels that she does not exist.  She is not sleeping very well.  We have recommended to do GeneSight testing.  She had a testing results and majority of the antidepressants are in significant interaction.  Pristiq, Zoloft and Viibryd is in favorable column.  Majority of anxiolytics are good.  Vraylar, Caplyta, Latuda, Invega, Geodon and Navane are also in good column.  Abilify , Rexulti , risperidone had significant interactions.  Patient reported not eating well and may have lost 1 or 2 pound.  She is nervous and worried if she ever again and get better.  She denies any hallucination, paranoia or any aggressive behavior.  She had a Thanksgiving with her husband.  Patient also reported husband is very busy and she usually try to spend time with her mother.  Sometimes she feel that she is not being productive in her life.  She also has mild tremors.  Visit Diagnosis:    ICD-10-CM   1. MDD (major depressive  disorder), recurrent episode, moderate (HCC)  F33.1 sertraline (ZOLOFT) 50 MG tablet    2. Social anxiety disorder  F40.10 sertraline (ZOLOFT) 50 MG tablet    3. Obsessive-compulsive disorder, unspecified type  F42.9 sertraline (ZOLOFT) 50 MG tablet      Past Psychiatric History: Reviewed H/O anxiety, OCD, mania and depression. H/O inpatient at [facility] in 2010. No h/o suicidal attempt.  Seroquel cause weight gain, Risperdal caused nausea, Trintellix cause itching, Geodon, Neurontin, Paxil, Tofranil and Abilify  did not help. Luvox  Caused side effects.Took Lamictal  worked in [place] work in [place] . Wellbutrin  caused loss of taste. ECT helped.     Past Medical History:  Past Medical History:  Diagnosis Date   Allergy    Depression with anxiety    est. with psychiatry, Dr. Saffron Busey. In the past she reports she has been told she was bipolar and OCD   OCD (obsessive compulsive disorder)    Osteoarthritis    Vitamin D  deficiency     Past Surgical History:  Procedure Laterality Date   CERVICAL BIOPSY  10/08/2016   CIN I-low grade   COLPOSCOPY  2004,2005,2012,2018   TOE SURGERY Right     Family Psychiatric History: Reviewed  Family History:  Family History  Problem Relation Age of Onset   Arthritis Mother    Hypertension Mother    Heart disease Father    Stroke Father    Polycythemia  Father    Breast cancer Sister 27   Breast cancer Maternal Grandmother 52   Melanoma Maternal Grandfather    COPD Maternal Grandfather    Cancer Maternal Grandfather        melanoma   Diabetes Paternal Grandmother    Heart disease Paternal Grandfather    Bipolar disorder Paternal Uncle     Social History:  Social History   Socioeconomic History   Marital status: Married    Spouse name: markus   Number of children: 0   Years of education: Not on file   Highest education level: Master's degree (e.g., MA, MS, MEng, MEd, MSW, MBA)  Occupational History   Not on file   Tobacco Use   Smoking status: Never    Passive exposure: Never   Smokeless tobacco: Never  Vaping Use   Vaping status: Never Used  Substance and Sexual Activity   Alcohol use: Yes    Comment: rare   Drug use: Never   Sexual activity: Yes    Partners: Male    Birth control/protection: Pill    Comment: menarche [age]yo, 1st intercourse- 16, partners- less than 20, married- 44yrs  Other Topics Concern   Not on file  Social History Narrative   Married. Husband lives in [place] through the week. She recently moved to [place] to be closer to her family.    Masters of Arts degree. Unemployed.    Social Drivers of Corporate Investment Banker Strain: Low Risk  (08/01/2022)   Received from [facility]   Overall Financial Resource Strain (CARDIA)    Difficulty of Paying Living Expenses: Not hard at all  Food Insecurity: No Food Insecurity (08/01/2022)   Received from [facility]   Hunger Vital Sign    Within the past 12 months, you worried that your food would run out before you got the money to buy more.: Never true    Within the past 12 months, the food you bought just didn't last and you didn't have money to get more.: Never true  Transportation Needs: No Transportation Needs (08/01/2022)   Received from [facility] - Transportation    Lack of Transportation (Medical): No    Lack of Transportation (Non-Medical): No  Physical Activity: Insufficiently Active (08/01/2022)   Received from [facility]   Exercise Vital Sign    On average, how many days per week do you engage in moderate to strenuous exercise (like a brisk walk)?: 2 days    On average, how many minutes do you engage in exercise at this level?: 50 min  Stress: Stress Concern Present (08/01/2022)   Received from [facility] - Occupational Stress Questionnaire    Feeling of Stress : Rather much  Social Connections: Somewhat Isolated (08/01/2022)   Received from [facility]    Social Network    How would you rate your social network (family, work, friends)?: Restricted participation with some degree of social isolation    Allergies: No Known Allergies  Metabolic Disorder Labs: Lab Results  Component Value Date   HGBA1C 5.5 03/21/2024   MPG 97 02/02/2022   No results found for: PROLACTIN Lab Results  Component Value Date   CHOL 177 03/21/2024   TRIG 66.0 03/21/2024   HDL 47.60 03/21/2024   CHOLHDL 4 03/21/2024   VLDL 13.2 03/21/2024   LDLCALC 116 (H) 03/21/2024   LDLCALC 93 02/02/2022   Lab Results  Component Value Date  TSH 3.90 04/13/2024   TSH 5.67 (H) 03/21/2024    Therapeutic Level Labs: No results found for: LITHIUM No results found for: VALPROATE No results found for: CBMZ  Current Medications: Current Outpatient Medications  Medication Sig Dispense Refill   clindamycin (CLEOCIN T) 1 % lotion clindamycin 1 % lotion     clonazePAM  (KLONOPIN ) 0.5 MG tablet Take 1 tablet (0.5 mg total) by mouth 3 (three) times daily as needed for anxiety. 90 tablet 0   FLUoxetine  (PROZAC ) 10 MG capsule Take one capsule for one week and then two capsule daily 60 capsule 0   norethindrone -ethinyl estradiol  (BALZIVA ) 0.4-35 MG-MCG tablet Take 1 tablet by mouth daily. 84 tablet 4   nystatin -triamcinolone  ointment (MYCOLOG) Apply 1 Application topically 2 (two) times daily. 30 g 0   Sod Fluoride-Potassium Nitrate 1.1-5 % PSTE PreviDent 5000 Sensitive 1.1 %-5 % dental paste     SPRAVATO, 84 MG DOSE, 28 MG/DEVICE SOPK Place into both nostrils.     tretinoin  (RETIN-A ) 0.1 % cream Apply topically at bedtime.     VITAMIN D  PO Take by mouth.     bimatoprost (LATISSE) 0.03 % ophthalmic solution APPLY TO AFFECTED AREA EVERY DAY AS DIRECTED     brexpiprazole  (REXULTI ) 0.25 MG TABS tablet Take 1 tablet (0.25 mg total) by mouth daily. 30 tablet 0   citalopram  (CELEXA ) 40 MG tablet Take 1 tablet (40 mg total) by mouth daily. 30 tablet 2   No current  facility-administered medications for this visit.     Musculoskeletal: Strength & Muscle Tone: within normal limits Gait & Station: normal Patient leans: N/A  Psychiatric Specialty Exam: Review of Systems  Blood pressure 131/83, pulse 85, resp. rate 18, height 5' 1 (1.549 m), weight 98 lb 12.8 oz (44.8 kg).Body mass index is 18.67 kg/m.  General Appearance: Casual and tearful  Eye Contact:  Fair  Speech:  Slow  Volume:  Decreased  Mood:  Anxious, Depressed, Dysphoric, and Hopeless  Affect:  Constricted and Depressed  Thought Process:  Goal Directed  Orientation:  Full (Time, Place, and Person)  Thought Content: Rumination   Suicidal Thoughts:  No  Homicidal Thoughts:  No  Memory:  Immediate;   Good Recent;   Good Remote;   Good  Judgement:  Intact  Insight:  Present  Psychomotor Activity:  Decreased and Tremor  Concentration:  Concentration: Fair and Attention Span: Fair  Recall:  Good  Fund of Knowledge: Good  Language: Good  Akathisia:  No  Handed:  Right  AIMS (if indicated): not done  Assets:  Communication Skills Desire for Improvement Housing Social Support Transportation  ADL's:  Intact  Cognition: WNL  Sleep:  Fair   Screenings: ECT-MADRS    Flowsheet Row ECT Treatment from 05/16/2018 in [facility] ECT Treatment from 05/09/2018 in [facility]  MADRS Total Score 22 35   GAD-7    Flowsheet Row Office Visit from 01/25/2020 in [facility]  Total GAD-7 Score 5   Mini-Mental    Flowsheet Row ECT Treatment from 05/16/2018 in [facility] ECT Treatment from 05/09/2018 in [facility]  Total Score (max 30 points ) 30 30   PHQ2-9    Flowsheet Row Office Visit from 04/12/2024 in [facility] Office Visit from 01/25/2020 in [facility] [place]  Office Visit from 01/26/2018 in [facility] [place]  Nature Conservation Officer at [facility] Total Score 1 1 2   PHQ-9 Total Score -- 4 7   Flowsheet Row Video [facility]/16[facility]20[facility] Video Visit from 10/16/2020 [facility] ECT Treatment from 05/18/2018 in [facility]  C-SSRS RISK CATEGORY No Risk No Risk Low Risk     Assessment and Plan: Patient is 53 year old Caucasian, married female with history of obsessive-compulsive disorder, major depressive disorder, social anxiety disorder.  Reviewed current medication.  It is unclear if Rexulti  causing side effects but she noticed more anxious, depressed and lack of motivation to do things.  She is also feeling mild tremulous and has no desire to do anything.  Recommend to discontinue Rexulti .  I also reviewed GeneSight testing results with the patient.  Will discontinue Prozac  as not in a favorable column.  Recommend to try Zoloft  as patient has never tried before and it is in a favorable column.  Will start 25 mg for a few days and then 50 mg daily.  Patient not sure if the new medicine will work and like to go back to Celexa .  I explained that she had taken Celexa  for a while and we have reached to the plateau and may not be working as good.  However I also agree if Zoloft  did not work then we can go back to Celexa  which is in a moderate column.  We talk about upcoming trip to [facility] in January.  Patient not sure if she like to cancel but I recommend to wait until we see the medication response.  She will continue clonazepam  0.5 mg up to 3 times a day.  Will follow-up in 2 weeks.  I encouraged to call back if she feel symptoms are worsening or anytime having active suicidal thoughts or homicidal thoughts.  We also talked about inpatient but patient at this time does not feel she need to go and agree to give a medication trial.  Collaboration of  Care: Collaboration of Care: Other provider involved in patient's care AEB notes are available in epic to review  Patient/Guardian was advised Release of Information must be obtained prior to any record release in order to collaborate their care with an outside provider. Patient/Guardian was advised if they have not already done so to contact the registration department to sign all necessary forms in order for us  to release information regarding their care.   Consent: Patient/Guardian gives verbal consent for treatment and assignment of benefits for services provided during this visit. Patient/Guardian expressed understanding and agreed to proceed.    Leni ONEIDA Client, MD 06/15/2024, 9:31 AM

## 2024-06-27 DIAGNOSIS — K5900 Constipation, unspecified: Secondary | ICD-10-CM | POA: Diagnosis not present

## 2024-06-29 ENCOUNTER — Encounter (HOSPITAL_COMMUNITY): Payer: Self-pay | Admitting: Psychiatry

## 2024-06-29 ENCOUNTER — Telehealth (HOSPITAL_COMMUNITY): Admitting: Psychiatry

## 2024-06-29 VITALS — Wt 93.0 lb

## 2024-06-29 DIAGNOSIS — F331 Major depressive disorder, recurrent, moderate: Secondary | ICD-10-CM | POA: Diagnosis not present

## 2024-06-29 DIAGNOSIS — F429 Obsessive-compulsive disorder, unspecified: Secondary | ICD-10-CM | POA: Diagnosis not present

## 2024-06-29 DIAGNOSIS — F4010 Social phobia, unspecified: Secondary | ICD-10-CM | POA: Diagnosis not present

## 2024-06-29 MED ORDER — CITALOPRAM HYDROBROMIDE 40 MG PO TABS: 40.0000 mg | ORAL_TABLET | Freq: Every day | ORAL | 1 refills | Status: DC

## 2024-06-29 MED ORDER — LORAZEPAM 0.5 MG PO TABS: 0.5000 mg | ORAL_TABLET | Freq: Three times a day (TID) | ORAL | 0 refills | Status: DC | PRN

## 2024-06-29 NOTE — Progress Notes (Signed)
 [facility] MD Virtual Progress Note   Patient Location: Home Provider Location: Office  I connect with patient by video and verified that I am speaking with correct person by using two identifiers. I discussed the limitations of evaluation and management by telemedicine and the availability of in person appointments. I also discussed with the patient that there may be a patient responsible charge related to this service. The patient expressed understanding and agreed to proceed.  Anne Hayden [id number] 53 y.o.  06/29/2024 10:27 AM  History of Present Illness:  Patient is evaluated by video session.  On the last visit we started her on Zoloft  since she had never tried before but patient reported it is causing twitching and she is not hungry.  She also not sure if she wants to go with her mother to [facility] for her uncle's 90th birthday.  She does not want to leave her husband.  She noticed since her husband admitted in the hospital few months ago she is not doing very well.  Now she like to go back to Celexa  which she was taking for a while and did help but stopped after she noticed not working anymore.  We had tried since then Rexulti , Prozac  and recently Zoloft .  She is also taking Klonopin  but sometimes feel it is not working.  She denies any hallucination, paranoia but her anxiety is very high.  She sleeps okay with the help of Klonopin .  She denies any anhedonia or any active or passive suicidal thoughts.  She admitted feeling guilty about her decision not going with the mother.  She denies drinking or using any illegal substances.  Her schedule to go with the mother on January 15.  She has mild tremors but stable.  Past Psychiatric History: H/O anxiety, OCD, mania and depression. H/O inpatient at [facility] in 2010. No h/o suicidal attempt.  Seroquel cause weight gain, Risperdal caused nausea, Trintellix cause itching, Geodon, Neurontin, Paxil, Tofranil and  Abilify  did not help. Luvox  Caused side effects.[place]ook Lamictal  [place] . Wellbutrin  caused loss of taste. ECT helped.   Rexulti , Prozac  and Zoloft  did not work  Past Medical History:  Diagnosis Date   Allergy    Depression with anxiety    est. with psychiatry, Dr. Christine Morton. In the past she reports she has been told she was bipolar and OCD   OCD (obsessive compulsive disorder)    Osteoarthritis    Vitamin D  deficiency     Outpatient Encounter Medications as of 06/29/2024  Medication Sig   bimatoprost (LATISSE) 0.03 % ophthalmic solution APPLY TO AFFECTED AREA EVERY DAY AS DIRECTED   citalopram  (CELEXA ) 40 MG tablet Take 1 tablet (40 mg total) by mouth daily. (Patient not taking: Reported on 06/15/2024)   clindamycin (CLEOCIN T) 1 % lotion clindamycin 1 % lotion   clonazePAM  (KLONOPIN ) 0.5 MG tablet Take 1 tablet (0.5 mg total) by mouth 3 (three) times daily as needed for anxiety.   FLUoxetine  (PROZAC ) 10 MG capsule Take one capsule for one week and then two capsule daily (Patient not taking: Reported on 06/15/2024)   norethindrone -ethinyl estradiol  (BALZIVA ) 0.4-35 MG-MCG tablet Take 1 tablet by mouth daily.   nystatin -triamcinolone  ointment (MYCOLOG) Apply 1 Application topically 2 (two) times daily.   sertraline  (ZOLOFT ) 50 MG tablet Take 1 tablet (50 mg total) by mouth daily.   Sod Fluoride-Potassium Nitrate 1.1-5 % PSTE PreviDent 5000 Sensitive 1.1 %-5 % dental paste  SPRAVATO, 84 MG DOSE, 28 MG/DEVICE SOPK Place into both nostrils.   tretinoin  (RETIN-A ) 0.1 % cream Apply topically at bedtime.   VITAMIN D  PO Take by mouth.   No facility-administered encounter medications on file as of 06/29/2024.    Recent Results (from the past 2160 hours)  TSH     Status: None   Collection Time: 04/13/24  9:55 AM  Result Value Ref Range   TSH 3.90 0.35 - 5.50 uIU/mL  T4, free     Status: None   Collection Time: 04/13/24  9:55 AM  Result Value Ref Range   Free T4  0.91 0.60 - 1.60 ng/dL    Comment: Specimens from patients who are undergoing biotin therapy and /or ingesting biotin supplements may contain high levels of biotin.  The higher biotin concentration in these specimens interferes with this Free T4 assay.  Specimens that contain high levels  of biotin may cause false high results for this Free T4 assay.  Please interpret results in light of the total clinical presentation of the patient.    Thyroid  peroxidase antibody     Status: None   Collection Time: 04/13/24  9:55 AM  Result Value Ref Range   Thyroperoxidase Ab SerPl-aCnc <1 <9 IU/mL     Psychiatric Specialty Exam: Physical Exam  Review of Systems  Weight 93 lb (42.2 kg).There is no height or weight on file to calculate BMI.  General Appearance: Casual  Eye Contact:  Fair  Speech:  Slow  Volume:  Decreased  Mood:  Anxious, Depressed, and Dysphoric  Affect:  Constricted and Depressed  Thought Process:  Goal Directed  Orientation:  Full (Time, Place, and Person)  Thought Content:  Rumination  Suicidal Thoughts:  No  Homicidal Thoughts:  No  Memory:  Immediate;   Good Recent;   Good Remote;   Good  Judgement:  Fair  Insight:  Present  Psychomotor Activity:  Decreased and Tremor  Concentration:  Concentration: Fair and Attention Span: Fair  Recall:  Fair  Fund of Knowledge:  Good  Language:  Good  Akathisia:  No  Handed:  Right  AIMS (if indicated):     Assets:  Communication Skills Desire for Improvement Housing Transportation  ADL's:  Intact  Cognition:  WNL  Sleep: Okay       04/12/2024   11:32 AM 01/25/2020    9:55 AM 01/26/2018   11:07 AM  Depression screen PHQ 2/9  Decreased Interest 0 0 0  Down, Depressed, Hopeless 1 1 2   PHQ - 2 Score 1 1 2   Altered sleeping  0 1  Tired, decreased energy  0 1  Change in appetite  0 0  Feeling bad or failure about yourself   0 2  Trouble concentrating  3 1  Moving slowly or fidgety/restless  0 0  Suicidal thoughts  0 0   PHQ-9 Score  4  7   Difficult doing work/chores  Not difficult at all      Data saved with a previous flowsheet row definition    Assessment/Plan: MDD (major depressive disorder), recurrent episode, moderate (HCC) - Plan: citalopram  (CELEXA ) 40 MG tablet, LORazepam  (ATIVAN ) 0.5 MG tablet  Obsessive-compulsive disorder, unspecified type - Plan: citalopram  (CELEXA ) 40 MG tablet, LORazepam  (ATIVAN ) 0.5 MG tablet  Social anxiety disorder - Plan: LORazepam  (ATIVAN ) 0.5 MG tablet  Patient is 53 year old Caucasian married female with history of OCD, major depressive disorder social anxiety disorder currently on Zoloft  and Klonopin .  Recently Zoloft  because  muscle twitching.  Patient ruminates about her upcoming trip to [facility] with her mother.  She does not want to go but she also feels very guilty.  Discussed to go back to the citalopram  which had worked better than recent medications.  I also recommend to try Ativan  and discontinue Klonopin .  She is hoping the Ativan  may work little faster.  Patient agree that if medicine work then she will go with the mother and not to feel guilty.  Discussed in detail the difference between benzodiazepine.  We have recommended to see a therapist but patient is not interested.  Encouraged to call back if she has any question, concern.  Will follow-up in 6 weeks.  Recommend to avoid driving after taking the benzodiazepine.   Follow Up Instructions:     I discussed the assessment and treatment plan with the patient. The patient was provided an opportunity to ask questions and all were answered. The patient agreed with the plan and demonstrated an understanding of the instructions.   The patient was advised to call back or seek an in-person evaluation if the symptoms worsen or if the condition fails to improve as anticipated.    Collaboration of Care: Other provider involved in patient's care AEB notes are available in epic to review  Patient/Guardian was  advised Release of Information must be obtained prior to any record release in order to collaborate their care with an outside provider. Patient/Guardian was advised if they have not already done so to contact the registration department to sign all necessary forms in order for us  to release information regarding their care.   Consent: Patient/Guardian gives verbal consent for treatment and assignment of benefits for services provided during this visit. Patient/Guardian expressed understanding and agreed to proceed.     Total encounter time 24 minutes which includes face-to-face time, chart reviewed, care coordination, order entry and documentation during this encounter.   Note: This document was prepared by [employer] voice dictation technology and any errors that results from this process are unintentional.    Leni ONEIDA Client, MD 06/29/2024

## 2024-07-03 ENCOUNTER — Telehealth (HOSPITAL_COMMUNITY): Payer: Self-pay | Admitting: *Deleted

## 2024-07-03 NOTE — Telephone Encounter (Signed)
 Pt called stating that she is experiencing increased anxiety and insomnia since she last visit with Dr. Curry (12/18) and med changes were made. Pt also says that she has lost more weight and is now @ 94 lbs due to having swallowing issues which she thinks is r/t increased anxiety. Pt endorsed underlying wanting to die constantly but denies plan or means. Pt does not want to go inpatient she says and would like advice. Going to [facility] for evaluation was discussed and pt received information and verbalized understanding. Please review and advise.

## 2024-07-05 ENCOUNTER — Other Ambulatory Visit (HOSPITAL_COMMUNITY): Payer: Self-pay | Admitting: *Deleted

## 2024-07-05 MED ORDER — MIRTAZAPINE 7.5 MG PO TABS: 7.5000 mg | ORAL_TABLET | Freq: Every day | ORAL | 0 refills | Status: DC

## 2024-07-20 ENCOUNTER — Telehealth (HOSPITAL_COMMUNITY): Payer: Self-pay | Admitting: *Deleted

## 2024-07-20 DIAGNOSIS — F429 Obsessive-compulsive disorder, unspecified: Secondary | ICD-10-CM

## 2024-07-20 MED ORDER — CLONAZEPAM 0.5 MG PO TABS: 0.5000 mg | ORAL_TABLET | Freq: Three times a day (TID) | ORAL | 0 refills | Status: DC | PRN

## 2024-07-20 MED ORDER — MIRTAZAPINE 7.5 MG PO TABS: 7.5000 mg | ORAL_TABLET | Freq: Every day | ORAL | 0 refills | Status: DC

## 2024-07-20 NOTE — Telephone Encounter (Signed)
 I called patient.  She is doing somewhat better with mirtazapine  but does not feel Ativan  helps.  She is going next week to [place].  She will be back week later.  She like to go back to Klonopin .  Recommended discontinuing Ativan  and restart Klonopin  0.5 mg up to 3 times a day.  Recommend to continue mirtazapine  7.5 mg at bedtime and she will also continue Celexa .

## 2024-07-20 NOTE — Telephone Encounter (Signed)
 Pt requests a refill of Klonopin  0.5 mg TID a she does not fell the Ativan  is working, or will work, when she goes to [place]. Pt said yesterday that the Remeron  was not increasing her appetite however today saysher appetite is good. Please review and advise.

## 2024-07-26 ENCOUNTER — Telehealth (HOSPITAL_COMMUNITY): Admitting: Psychiatry

## 2024-08-04 ENCOUNTER — Telehealth (HOSPITAL_COMMUNITY): Admitting: Psychiatry

## 2024-08-04 ENCOUNTER — Encounter (HOSPITAL_COMMUNITY): Payer: Self-pay | Admitting: Psychiatry

## 2024-08-04 VITALS — Wt 98.0 lb

## 2024-08-04 DIAGNOSIS — F4010 Social phobia, unspecified: Secondary | ICD-10-CM

## 2024-08-04 DIAGNOSIS — F429 Obsessive-compulsive disorder, unspecified: Secondary | ICD-10-CM

## 2024-08-04 DIAGNOSIS — F331 Major depressive disorder, recurrent, moderate: Secondary | ICD-10-CM | POA: Diagnosis not present

## 2024-08-04 MED ORDER — MIRTAZAPINE 7.5 MG PO TABS: 7.5000 mg | ORAL_TABLET | Freq: Every day | ORAL | 1 refills | Status: AC

## 2024-08-04 MED ORDER — CITALOPRAM HYDROBROMIDE 40 MG PO TABS: 40.0000 mg | ORAL_TABLET | Freq: Every day | ORAL | 1 refills | Status: AC

## 2024-08-04 MED ORDER — CLONAZEPAM 0.5 MG PO TABS: 0.5000 mg | ORAL_TABLET | Freq: Three times a day (TID) | ORAL | 1 refills | Status: AC | PRN

## 2024-08-04 NOTE — Progress Notes (Signed)
 " [facility] MD Virtual Progress Note   Patient Location: Home Provider Location: Home Office   I connect with patient by video and verified that I am speaking with correct person by using two identifiers. I discussed the limitations of evaluation and management by telemedicine and the availability of in person appointments. I also discussed with the patient that there may be a patient responsible charge related to this service. The patient expressed understanding and agreed to proceed.  JANELLIE TENNISON [id number] 54 y.o.  08/04/2024 10:37 AM  History of Present Illness:  Patient is evaluated by video session.  She doing somewhat better as back on Celexa  and we started low-dose mirtazapine .  She is not as depressed but is still anxious and nervous.  She admitted few pounds weight gain since started the medication.  She also reported sometimes feeling fogginess.  She did go to [place] with her mother and able to see the uncle.  Now she is concerned about upcoming weather [place] and also may have to go to [place] with her husband.  She reported a lot of anxiety about upcoming trip.  She wants to stay home.  She does not leave the house.  She feels unmotivated to do things.  She has tremors.  She is not sure why she is so anxious but wanted to give more time to mirtazapine .  She had tried Prozac , Zoloft , Rexulti  and stopped after having side effects or did not work.  She denies any hallucination, paranoia or any active or passive suicidal thoughts.  She had a good support from her husband.  She feels sleep is better but also unmotivated.  She admitted not doing any projects, walking or leaving the house unless it is important.  She used to enjoy television but not anymore.  We have discussed to consider therapy but patient has not started yet.  She denies any agitation, anger, mood swing.  She feels very anxious around people.  She admitted obsess about everything and now she is obsess about  upcoming snow on the weekend.  Past Psychiatric History: H/O anxiety, OCD, mania and depression. H/O inpatient at [facility] in 2010. No h/o suicidal attempt.  Seroquel cause weight gain, Risperdal caused nausea, Trintellix cause itching, Geodon, Neurontin, Paxil, Tofranil and Abilify  did not help. Luvox  Caused side effects.Took Lamictal  worked in [place] but did not work in [employer] . Wellbutrin  caused loss of taste. ECT helped.   Rexulti , Prozac  and Zoloft  did not work  Past Medical History:  Diagnosis Date   Allergy    Depression with anxiety    est. with psychiatry, Dr. Sumie Remsen. In the past she reports she has been told she was bipolar and OCD   OCD (obsessive compulsive disorder)    Osteoarthritis    Vitamin D  deficiency     Outpatient Encounter Medications as of 08/04/2024  Medication Sig   bimatoprost (LATISSE) 0.03 % ophthalmic solution APPLY TO AFFECTED AREA EVERY DAY AS DIRECTED   citalopram  (CELEXA ) 40 MG tablet Take 1 tablet (40 mg total) by mouth daily.   clindamycin (CLEOCIN T) 1 % lotion clindamycin 1 % lotion   clonazePAM  (KLONOPIN ) 0.5 MG tablet Take 1 tablet (0.5 mg total) by mouth 3 (three) times daily as needed for anxiety.   FLUoxetine  (PROZAC ) 10 MG capsule Take one capsule for one week and then two capsule daily (Patient not taking: Reported on 06/15/2024)   LORazepam  (ATIVAN ) 0.5 MG tablet Take 1 tablet (0.5 mg total) by mouth 3 (  three) times daily as needed for anxiety.   mirtazapine  (REMERON ) 7.5 MG tablet Take 1 tablet (7.5 mg total) by mouth at bedtime.   norethindrone -ethinyl estradiol  (BALZIVA ) 0.4-35 MG-MCG tablet Take 1 tablet by mouth daily.   nystatin -triamcinolone  ointment (MYCOLOG) Apply 1 Application topically 2 (two) times daily.   Sod Fluoride-Potassium Nitrate 1.1-5 % PSTE PreviDent 5000 Sensitive 1.1 %-5 % dental paste   SPRAVATO, 84 MG DOSE, 28 MG/DEVICE SOPK Place into both nostrils.   tretinoin  (RETIN-A ) 0.1 % cream Apply topically at bedtime.    VITAMIN D  PO Take by mouth.   No facility-administered encounter medications on file as of 08/04/2024.    No results found for this or any previous visit (from the past 2160 hours).    Psychiatric Specialty Exam: Physical Exam  Review of Systems  Weight 98 lb (44.5 kg).There is no height or weight on file to calculate BMI.  General Appearance: Casual  Eye Contact:  Fair  Speech:  Slow  Volume:  Decreased  Mood:  Anxious  Affect:  Congruent  Thought Process:  Goal Directed  Orientation:  Full (Time, Place, and Person)  Thought Content:  Rumination  Suicidal Thoughts:  No  Homicidal Thoughts:  No  Memory:  Immediate;   Good Recent;   Good Remote;   Good  Judgement:  Fair  Insight:  Present  Psychomotor Activity:  Decreased and Tremor  Concentration:  Concentration: Fair and Attention Span: Fair  Recall:  Fair  Fund of Knowledge:  Good  Language:  Good  Akathisia:  No  Handed:  Right  AIMS (if indicated):     Assets:  Communication Skills Desire for Improvement Housing Transportation  ADL's:  Intact  Cognition:  WNL  Sleep: Okay       04/12/2024   11:32 AM 01/25/2020    9:55 AM 01/26/2018   11:07 AM  Depression screen PHQ 2/9  Decreased Interest 0 0 0  Down, Depressed, Hopeless 1 1 2   PHQ - 2 Score 1 1 2   Altered sleeping  0 1  Tired, decreased energy  0 1  Change in appetite  0 0  Feeling bad or failure about yourself   0 2  Trouble concentrating  3 1  Moving slowly or fidgety/restless  0 0  Suicidal thoughts  0 0  PHQ-9 Score  4  7   Difficult doing work/chores  Not difficult at all      Data saved with a previous flowsheet row definition    Assessment/Plan: MDD (major depressive disorder), recurrent episode, moderate (HCC) - Plan: citalopram  (CELEXA ) 40 MG tablet  Obsessive-compulsive disorder, unspecified type - Plan: citalopram  (CELEXA ) 40 MG tablet, mirtazapine  (REMERON ) 7.5 MG tablet, clonazePAM  (KLONOPIN ) 0.5 MG tablet  Social anxiety disorder  - Plan: citalopram  (CELEXA ) 40 MG tablet  Patient is 54 year old Caucasian married female with OCD, major depressive disorder and social anxiety disorder.  Review stressors, current medication.  She is back on Klonopin  since Ativan  did not help.  She had left message to change the medication.  We started her on mirtazapine  7.5 mg discussed in detail about the medication side effects, efficacy and long-term prognosis.  Patient does not want to be on too many medication but realized that she need to take something to help herself.  I also emphasized that she need to walk and consider changing her lifestyle.  We have also discussed about considering therapy but patient not sure but willing to think about it.  We agreed to  keep more time to mirtazapine .  I explained it is a very low dose and possible tremors may be with the medication but usually it go away.  If it does not go away we will consider medication adjustment.  Discontinue Zoloft , Prozac  since patient is no longer taking.  Continue Celexa  40 mg, Klonopin  0.5 mg 1 in the morning and 2 at bedtime and keep the mirtazapine  7.5 mg.  Patient required a lot of reassurance.  She gained few pounds from the past.  Discussed short-term and long-term side effects of the medication.  Will follow-up in 4 to 6 weeks unless patient require a sooner appointment.    Follow Up Instructions:     I discussed the assessment and treatment plan with the patient. The patient was provided an opportunity to ask questions and all were answered. The patient agreed with the plan and demonstrated an understanding of the instructions.   The patient was advised to call back or seek an in-person evaluation if the symptoms worsen or if the condition fails to improve as anticipated.    Collaboration of Care: Other provider involved in patient's care AEB notes are available in epic to review  Patient/Guardian was advised Release of Information must be obtained prior to any  record release in order to collaborate their care with an outside provider. Patient/Guardian was advised if they have not already done so to contact the registration department to sign all necessary forms in order for us  to release information regarding their care.   Consent: Patient/Guardian gives verbal consent for treatment and assignment of benefits for services provided during this visit. Patient/Guardian expressed understanding and agreed to proceed.     Total encounter time 31 minutes which includes face-to-face time, chart reviewed, discussing contributing factor to her symptoms, long-term prognosis, medication side effects and benefits, care coordination, order entry and documentation during this encounter.   Note: This document was prepared by [employer] voice dictation technology and any errors that results from this process are unintentional.    Leni ONEIDA Client, MD 08/04/2024   "

## 2024-09-08 ENCOUNTER — Telehealth (HOSPITAL_COMMUNITY): Admitting: Psychiatry

## 2024-09-15 ENCOUNTER — Telehealth (HOSPITAL_COMMUNITY): Admitting: Psychiatry
